# Patient Record
Sex: Female | Born: 1965 | Race: Black or African American | Hispanic: No | Marital: Married | Smoking: Never smoker
Health system: Southern US, Community
[De-identification: ages and names within clinical notes are randomized; demographics above are authoritative.]

## PROBLEM LIST (undated history)

## (undated) DIAGNOSIS — I219 Acute myocardial infarction, unspecified: Secondary | ICD-10-CM

## (undated) DIAGNOSIS — R002 Palpitations: Secondary | ICD-10-CM

## (undated) DIAGNOSIS — F411 Generalized anxiety disorder: Secondary | ICD-10-CM

## (undated) DIAGNOSIS — G43009 Migraine without aura, not intractable, without status migrainosus: Secondary | ICD-10-CM

## (undated) DIAGNOSIS — R5381 Other malaise: Secondary | ICD-10-CM

## (undated) DIAGNOSIS — E669 Obesity, unspecified: Secondary | ICD-10-CM

## (undated) DIAGNOSIS — G35 Multiple sclerosis: Secondary | ICD-10-CM

## (undated) DIAGNOSIS — E785 Hyperlipidemia, unspecified: Secondary | ICD-10-CM

## (undated) DIAGNOSIS — H409 Unspecified glaucoma: Secondary | ICD-10-CM

## (undated) DIAGNOSIS — M549 Dorsalgia, unspecified: Secondary | ICD-10-CM

## (undated) DIAGNOSIS — E559 Vitamin D deficiency, unspecified: Secondary | ICD-10-CM

## (undated) DIAGNOSIS — G43109 Migraine with aura, not intractable, without status migrainosus: Secondary | ICD-10-CM

## (undated) DIAGNOSIS — J302 Other seasonal allergic rhinitis: Secondary | ICD-10-CM

## (undated) DIAGNOSIS — K219 Gastro-esophageal reflux disease without esophagitis: Secondary | ICD-10-CM

## (undated) DIAGNOSIS — M810 Age-related osteoporosis without current pathological fracture: Secondary | ICD-10-CM

## (undated) DIAGNOSIS — M5030 Other cervical disc degeneration, unspecified cervical region: Secondary | ICD-10-CM

## (undated) DIAGNOSIS — R5383 Other fatigue: Secondary | ICD-10-CM

## (undated) HISTORY — DX: Palpitations: R00.2

## (undated) HISTORY — DX: Acute myocardial infarction, unspecified: I21.9

## (undated) HISTORY — DX: Obesity, unspecified: E66.9

## (undated) HISTORY — DX: Unspecified glaucoma: H40.9

## (undated) HISTORY — DX: Migraine with aura, not intractable, without status migrainosus: G43.109

## (undated) HISTORY — PX: OTHER SURGICAL HISTORY: SHX169

## (undated) HISTORY — DX: Vitamin D deficiency, unspecified: E55.9

## (undated) HISTORY — PX: WISDOM TOOTH EXTRACTION: SHX21

## (undated) HISTORY — DX: Other seasonal allergic rhinitis: J30.2

## (undated) HISTORY — DX: Other fatigue: R53.83

## (undated) HISTORY — DX: Hyperlipidemia, unspecified: E78.5

## (undated) HISTORY — DX: Migraine without aura, not intractable, without status migrainosus: G43.009

## (undated) HISTORY — DX: Age-related osteoporosis without current pathological fracture: M81.0

## (undated) HISTORY — DX: Other cervical disc degeneration, unspecified cervical region: M50.30

## (undated) HISTORY — DX: Other malaise: R53.81

## (undated) HISTORY — PX: ABDOMINAL HYSTERECTOMY: SHX81

## (undated) HISTORY — DX: Generalized anxiety disorder: F41.1

## (undated) HISTORY — DX: Gastro-esophageal reflux disease without esophagitis: K21.9

## (undated) HISTORY — DX: Dorsalgia, unspecified: M54.9

## (undated) HISTORY — DX: Multiple sclerosis: G35

---

## 1998-01-19 ENCOUNTER — Other Ambulatory Visit: Admission: RE | Admit: 1998-01-19 | Discharge: 1998-01-19 | Payer: Self-pay | Admitting: Obstetrics and Gynecology

## 1998-02-25 ENCOUNTER — Inpatient Hospital Stay (HOSPITAL_COMMUNITY): Admission: AD | Admit: 1998-02-25 | Discharge: 1998-02-25 | Payer: Self-pay | Admitting: *Deleted

## 1998-04-21 ENCOUNTER — Inpatient Hospital Stay (HOSPITAL_COMMUNITY): Admission: AD | Admit: 1998-04-21 | Discharge: 1998-04-21 | Payer: Self-pay | Admitting: Obstetrics and Gynecology

## 1998-04-23 ENCOUNTER — Ambulatory Visit (HOSPITAL_COMMUNITY): Admission: RE | Admit: 1998-04-23 | Discharge: 1998-04-23 | Payer: Self-pay | Admitting: Obstetrics and Gynecology

## 1998-06-30 ENCOUNTER — Encounter: Payer: Self-pay | Admitting: Obstetrics & Gynecology

## 1998-06-30 ENCOUNTER — Inpatient Hospital Stay (HOSPITAL_COMMUNITY): Admission: AD | Admit: 1998-06-30 | Discharge: 1998-06-30 | Payer: Self-pay | Admitting: Obstetrics & Gynecology

## 1998-07-02 ENCOUNTER — Inpatient Hospital Stay (HOSPITAL_COMMUNITY): Admission: AD | Admit: 1998-07-02 | Discharge: 1998-07-04 | Payer: Self-pay | Admitting: Obstetrics and Gynecology

## 1998-08-30 ENCOUNTER — Ambulatory Visit (HOSPITAL_COMMUNITY): Admission: RE | Admit: 1998-08-30 | Discharge: 1998-08-30 | Payer: Self-pay | Admitting: Obstetrics and Gynecology

## 1998-09-06 ENCOUNTER — Inpatient Hospital Stay (HOSPITAL_COMMUNITY): Admission: AD | Admit: 1998-09-06 | Discharge: 1998-09-09 | Payer: Self-pay | Admitting: Obstetrics and Gynecology

## 1998-10-19 ENCOUNTER — Other Ambulatory Visit: Admission: RE | Admit: 1998-10-19 | Discharge: 1998-10-19 | Payer: Self-pay | Admitting: Obstetrics and Gynecology

## 1998-10-21 ENCOUNTER — Emergency Department (HOSPITAL_COMMUNITY): Admission: EM | Admit: 1998-10-21 | Discharge: 1998-10-21 | Payer: Self-pay | Admitting: Emergency Medicine

## 1998-11-09 ENCOUNTER — Emergency Department (HOSPITAL_COMMUNITY): Admission: EM | Admit: 1998-11-09 | Discharge: 1998-11-09 | Payer: Self-pay | Admitting: Emergency Medicine

## 1998-11-27 ENCOUNTER — Encounter: Payer: Self-pay | Admitting: Internal Medicine

## 1998-11-27 ENCOUNTER — Ambulatory Visit (HOSPITAL_COMMUNITY): Admission: RE | Admit: 1998-11-27 | Discharge: 1998-11-27 | Payer: Self-pay | Admitting: Internal Medicine

## 1998-12-11 ENCOUNTER — Ambulatory Visit (HOSPITAL_COMMUNITY): Admission: RE | Admit: 1998-12-11 | Discharge: 1998-12-11 | Payer: Self-pay | Admitting: Psychiatry

## 1999-02-18 ENCOUNTER — Ambulatory Visit (HOSPITAL_COMMUNITY): Admission: RE | Admit: 1999-02-18 | Discharge: 1999-02-18 | Payer: Self-pay | Admitting: Psychiatry

## 2000-06-24 ENCOUNTER — Encounter: Payer: Self-pay | Admitting: Psychiatry

## 2000-06-24 ENCOUNTER — Ambulatory Visit (HOSPITAL_COMMUNITY): Admission: RE | Admit: 2000-06-24 | Discharge: 2000-06-24 | Payer: Self-pay

## 2001-01-30 ENCOUNTER — Ambulatory Visit (HOSPITAL_COMMUNITY): Admission: RE | Admit: 2001-01-30 | Discharge: 2001-01-30 | Payer: Self-pay | Admitting: Neurosurgery

## 2001-01-30 ENCOUNTER — Encounter: Payer: Self-pay | Admitting: Psychiatry

## 2001-04-24 ENCOUNTER — Emergency Department (HOSPITAL_COMMUNITY): Admission: EM | Admit: 2001-04-24 | Discharge: 2001-04-24 | Payer: Self-pay | Admitting: Emergency Medicine

## 2001-04-24 ENCOUNTER — Encounter: Payer: Self-pay | Admitting: Emergency Medicine

## 2002-05-16 ENCOUNTER — Encounter: Payer: Self-pay | Admitting: Emergency Medicine

## 2002-05-16 ENCOUNTER — Emergency Department (HOSPITAL_COMMUNITY): Admission: EM | Admit: 2002-05-16 | Discharge: 2002-05-16 | Payer: Self-pay | Admitting: Emergency Medicine

## 2002-09-12 ENCOUNTER — Emergency Department (HOSPITAL_COMMUNITY): Admission: EM | Admit: 2002-09-12 | Discharge: 2002-09-12 | Payer: Self-pay | Admitting: Emergency Medicine

## 2002-09-12 ENCOUNTER — Encounter: Payer: Self-pay | Admitting: Emergency Medicine

## 2004-06-20 ENCOUNTER — Ambulatory Visit (HOSPITAL_COMMUNITY): Admission: RE | Admit: 2004-06-20 | Discharge: 2004-06-20 | Payer: Self-pay | Admitting: Neurology

## 2005-02-11 ENCOUNTER — Encounter (INDEPENDENT_AMBULATORY_CARE_PROVIDER_SITE_OTHER): Payer: Self-pay | Admitting: Specialist

## 2005-02-11 ENCOUNTER — Inpatient Hospital Stay (HOSPITAL_COMMUNITY): Admission: RE | Admit: 2005-02-11 | Discharge: 2005-02-12 | Payer: Self-pay | Admitting: Obstetrics and Gynecology

## 2005-06-20 ENCOUNTER — Encounter: Payer: Self-pay | Admitting: Internal Medicine

## 2005-09-08 HISTORY — PX: OTHER SURGICAL HISTORY: SHX169

## 2006-07-19 ENCOUNTER — Inpatient Hospital Stay (HOSPITAL_COMMUNITY): Admission: EM | Admit: 2006-07-19 | Discharge: 2006-07-22 | Payer: Self-pay | Admitting: Emergency Medicine

## 2007-05-30 ENCOUNTER — Emergency Department (HOSPITAL_COMMUNITY): Admission: EM | Admit: 2007-05-30 | Discharge: 2007-05-30 | Payer: Self-pay | Admitting: Emergency Medicine

## 2007-12-24 ENCOUNTER — Emergency Department (HOSPITAL_COMMUNITY): Admission: EM | Admit: 2007-12-24 | Discharge: 2007-12-24 | Payer: Self-pay | Admitting: Emergency Medicine

## 2008-11-16 ENCOUNTER — Emergency Department (HOSPITAL_COMMUNITY): Admission: EM | Admit: 2008-11-16 | Discharge: 2008-11-16 | Payer: Self-pay | Admitting: Emergency Medicine

## 2009-02-15 ENCOUNTER — Emergency Department (HOSPITAL_COMMUNITY): Admission: EM | Admit: 2009-02-15 | Discharge: 2009-02-15 | Payer: Self-pay | Admitting: Emergency Medicine

## 2009-05-16 ENCOUNTER — Emergency Department (HOSPITAL_COMMUNITY): Admission: EM | Admit: 2009-05-16 | Discharge: 2009-05-16 | Payer: Self-pay | Admitting: Emergency Medicine

## 2009-06-08 LAB — CONVERTED CEMR LAB: Pap Smear: NORMAL

## 2009-06-08 LAB — HM MAMMOGRAPHY: HM Mammogram: NORMAL

## 2009-06-12 ENCOUNTER — Encounter: Admission: RE | Admit: 2009-06-12 | Discharge: 2009-06-12 | Payer: Self-pay | Admitting: Gastroenterology

## 2010-03-27 ENCOUNTER — Ambulatory Visit: Payer: Self-pay | Admitting: Internal Medicine

## 2010-03-27 DIAGNOSIS — E785 Hyperlipidemia, unspecified: Secondary | ICD-10-CM | POA: Insufficient documentation

## 2010-03-27 DIAGNOSIS — F411 Generalized anxiety disorder: Secondary | ICD-10-CM | POA: Insufficient documentation

## 2010-03-27 DIAGNOSIS — M5030 Other cervical disc degeneration, unspecified cervical region: Secondary | ICD-10-CM

## 2010-03-27 DIAGNOSIS — F410 Panic disorder [episodic paroxysmal anxiety] without agoraphobia: Secondary | ICD-10-CM | POA: Insufficient documentation

## 2010-03-27 DIAGNOSIS — M549 Dorsalgia, unspecified: Secondary | ICD-10-CM | POA: Insufficient documentation

## 2010-03-27 DIAGNOSIS — R5381 Other malaise: Secondary | ICD-10-CM

## 2010-03-27 DIAGNOSIS — R5383 Other fatigue: Secondary | ICD-10-CM

## 2010-03-27 DIAGNOSIS — G43009 Migraine without aura, not intractable, without status migrainosus: Secondary | ICD-10-CM

## 2010-03-27 DIAGNOSIS — K219 Gastro-esophageal reflux disease without esophagitis: Secondary | ICD-10-CM

## 2010-03-27 HISTORY — DX: Other cervical disc degeneration, unspecified cervical region: M50.30

## 2010-03-27 HISTORY — DX: Hyperlipidemia, unspecified: E78.5

## 2010-03-27 HISTORY — DX: Gastro-esophageal reflux disease without esophagitis: K21.9

## 2010-03-27 HISTORY — DX: Other malaise: R53.81

## 2010-03-27 HISTORY — DX: Migraine without aura, not intractable, without status migrainosus: G43.009

## 2010-03-27 HISTORY — DX: Generalized anxiety disorder: F41.1

## 2010-03-27 HISTORY — DX: Dorsalgia, unspecified: M54.9

## 2010-03-27 LAB — CONVERTED CEMR LAB
ALT: 23 units/L (ref 0–35)
AST: 24 units/L (ref 0–37)
Albumin: 3.7 g/dL (ref 3.5–5.2)
Alkaline Phosphatase: 70 units/L (ref 39–117)
BUN: 12 mg/dL (ref 6–23)
Basophils Absolute: 0 10*3/uL (ref 0.0–0.1)
Basophils Relative: 0.3 % (ref 0.0–3.0)
Bilirubin Urine: NEGATIVE
Bilirubin, Direct: 0.1 mg/dL (ref 0.0–0.3)
CO2: 30 meq/L (ref 19–32)
Calcium: 9.2 mg/dL (ref 8.4–10.5)
Chloride: 102 meq/L (ref 96–112)
Cholesterol: 241 mg/dL — ABNORMAL HIGH (ref 0–200)
Creatinine, Ser: 0.5 mg/dL (ref 0.4–1.2)
Direct LDL: 167.6 mg/dL
Eosinophils Absolute: 0.1 10*3/uL (ref 0.0–0.7)
Eosinophils Relative: 1 % (ref 0.0–5.0)
Folate: 19.8 ng/mL
GFR calc non Af Amer: 157.23 mL/min (ref >60)
Glucose, Bld: 85 mg/dL (ref 70–99)
HCT: 40.2 % (ref 36.0–46.0)
HDL: 50.1 mg/dL (ref >39.00)
Hemoglobin, Urine: NEGATIVE
Hemoglobin: 13.6 g/dL (ref 12.0–15.0)
Iron: 84 ug/dL (ref 42–145)
Ketones, ur: NEGATIVE mg/dL
Lymphocytes Relative: 40.3 % (ref 12.0–46.0)
Lymphs Abs: 2.5 10*3/uL (ref 0.7–4.0)
MCHC: 33.9 g/dL (ref 30.0–36.0)
MCV: 95.5 fL (ref 78.0–100.0)
Monocytes Absolute: 0.5 10*3/uL (ref 0.1–1.0)
Monocytes Relative: 7.6 % (ref 3.0–12.0)
Neutro Abs: 3.2 10*3/uL (ref 1.4–7.7)
Neutrophils Relative %: 50.8 % (ref 43.0–77.0)
Nitrite: NEGATIVE
Platelets: 233 10*3/uL (ref 150.0–400.0)
Potassium: 4.5 meq/L (ref 3.5–5.1)
RBC: 4.21 M/uL (ref 3.87–5.11)
RDW: 13.9 % (ref 11.5–14.6)
Saturation Ratios: 25.8 % (ref 20.0–50.0)
Sed Rate: 39 mm/hr — ABNORMAL HIGH (ref 0–22)
Sodium: 134 meq/L — ABNORMAL LOW (ref 135–145)
Specific Gravity, Urine: 1.02 (ref 1.000–1.030)
TSH: 1.02 microintl units/mL (ref 0.35–5.50)
Total Bilirubin: 0.3 mg/dL (ref 0.3–1.2)
Total CHOL/HDL Ratio: 5
Total Protein, Urine: NEGATIVE mg/dL
Total Protein: 7.2 g/dL (ref 6.0–8.3)
Transferrin: 232.5 mg/dL (ref 212.0–360.0)
Triglycerides: 118 mg/dL (ref 0.0–149.0)
Urine Glucose: NEGATIVE mg/dL
Urobilinogen, UA: 0.2 (ref 0.0–1.0)
VLDL: 23.6 mg/dL (ref 0.0–40.0)
Vitamin B-12: 702 pg/mL (ref 211–911)
WBC: 6.3 10*3/uL (ref 4.5–10.5)
pH: 8 (ref 5.0–8.0)

## 2010-03-28 LAB — CONVERTED CEMR LAB: Vit D, 25-Hydroxy: 22 ng/mL — ABNORMAL LOW (ref 30–89)

## 2010-08-07 ENCOUNTER — Ambulatory Visit (HOSPITAL_COMMUNITY)
Admission: RE | Admit: 2010-08-07 | Discharge: 2010-08-08 | Payer: Self-pay | Source: Home / Self Care | Admitting: Ophthalmology

## 2010-09-26 ENCOUNTER — Telehealth: Payer: Self-pay | Admitting: Endocrinology

## 2010-10-10 NOTE — Assessment & Plan Note (Signed)
Summary: NEW/ MEDICARE/ [place]ICAID/[place]  #   Vital Signs:  Patient profile:   45 year old female Height:      63 inches Weight:      204.25 pounds BMI:     36.31 O2 Sat:      99 % on Room air Temp:     98.1 degrees F oral Pulse rate:   68 / minute BP sitting:   94 / 64  (left arm) Cuff size:   large  Vitals Entered By: Zella Ball Ewing CMA Duncan Dull) (March 27, 2010 9:44 AM)  O2 Flow:  Room air  Preventive Care Screening  Last Tetanus Booster:    Date:  03/27/2010    Results:  Tdap  Pap Smear:    Date:  06/08/2009    Results:  normal   Mammogram:    Date:  06/08/2009    Results:  normal   CC: New Patient, New Medicare/Re   CC:  New Patient and New Medicare/Re.  History of Present Illness: overall doing ok; has chronic anxiety and vague symtpoms she attributes to MS, Pt denies CP, sob, doe, wheezing, orthopnea, pnd, worsening LE edema, palps, dizziness or syncope  Pt denies new neuro symptoms such as headache, facial or extremity weakness Denies polydipsia, polyuria.  No fever, wt loss, night sweats, loss of appetite or other constitutional symptoms Overall good med compliacne and tolerance.  Has hx of significant wt gain with prednisone in the past Here for wellness Diet: Heart Healthy or DM if diabetic Physical Activities: Sedentary Depression/mood screen: Negative Hearing: Intact bilateral Visual Acuity: Grossly normal, gets exam yearly ADL's: Capable  Fall Risk: None Home Safety: Good Cognitive Impairment:  Gen appearance, affect, speech, memory, attention & motor skills grossly intact End-of-Life Planning: Advance directive - Full code/I agree   Preventive Screening-Counseling & Management  Alcohol-Tobacco     Smoking Status: never      Drug Use:  no.    Problems Prior to Update: 1)  Common Migraine  (ICD-346.10) 2)  Fatigue  (ICD-780.79) 3)  Disc Disease, Cervical  (ICD-722.4) 4)  Back Pain, Chronic  (ICD-724.5) 5)  Hyperlipidemia  (ICD-272.4) 6)  Anxiety   (ICD-300.00) 7)  Gerd  (ICD-530.81)  Medications Prior to Update: 1)  None  Current Medications (verified): 1)  Ibuprofen 600 Mg Tabs (Ibuprofen) .Marland Kitchen.. 1 By Mouth Once Daily As Needed For Pain 2)  Alprazolam 0.5 Mg Tabs (Alprazolam) .... 1/2 By Mouth Two Times A Day 3)  Allegra 60 Mg Tabs (Fexofenadine Hcl) .Marland Kitchen.. 1 By Mouth Once Daily As Needed For Allergies 4)  Combigan 0.2-0.5 % Soln (Brimonidine Tartrate-Timolol) .... 2 Drops Every Day 5)  Cymbalta 60 Mg Cpep (Duloxetine Hcl) .Marland Kitchen.. 1 By Mouth Once Daily  Allergies (verified): 1)  ! * Statins  Past History:  Family History: Last updated: 03/27/2010 multiple with DM, HTN, arthritis 2 uncle with lung cancer father with heart disease , and 2 uncles   Social History: Last updated: 03/27/2010 disabled due to "MS"   - since 2005 Single 1 son not working Never Smoked Alcohol use-no Drug use-no  Risk Factors: Smoking Status: never (03/27/2010)  Past Medical History: GERD ? MS - multiple sclerosis - per neurology 2002 [facility], still seen yearly/neg MRI brain 2005 chronic headaches/migraine Anxiety Hyperlipidemia chronic back pain  - mid back with radiation to all extremities on palpation for year cervical spine disease - mild by [facility] 2005  Past Surgical History: Hysterectomy s/p knee surgury s/p eye surgury Neg stress  test nov 2007  Family History: Reviewed history and no changes required. multiple with DM, HTN, arthritis 2 uncle with lung cancer father with heart disease , and 2 uncles   Social History: Reviewed history and no changes required. disabled due to "MS"   - since 2005 Single 1 son not working Never Smoked Alcohol use-no Drug use-no Smoking Status:  never Drug Use:  no  Review of Systems  The patient denies anorexia, fever, weight loss, weight gain, vision loss, decreased hearing, hoarseness, chest pain, syncope, dyspnea on exertion, peripheral edema, prolonged cough, headaches, hemoptysis,  abdominal pain, melena, hematochezia, severe indigestion/heartburn, hematuria, muscle weakness, suspicious skin lesions, transient blindness, difficulty walking, depression, unusual weight change, abnormal bleeding, and enlarged lymph nodes.         all otherwise negative per pt -  - except for fatigue without OSA symptoms, and decrlines depressive symptoms  Physical Exam  General:  alert and overweight-appearing.   Head:  normocephalic and atraumatic.   Eyes:  vision grossly intact, pupils equal, and pupils round.   Ears:  R ear normal and L ear normal.   Nose:  no external deformity and no nasal discharge.   Mouth:  no gingival abnormalities and pharynx pink and moist.   Neck:  supple and no masses.   Lungs:  normal respiratory effort and normal breath sounds.   Heart:  normal rate and regular rhythm.   Abdomen:  soft, non-tender, and normal bowel sounds.   Msk:  no joint tenderness and no joint swelling.   Extremities:  no edema, no erythema  Neurologic:  cranial nerves II-XII intact, strength normal in all extremities, gait normal, and DTRs symmetrical and normal.     Impression & Recommendations:  Problem # 1:  Preventive Health Care (ICD-V70.0)  Overall doing well, age appropriate education and counseling updated and referral for appropriate preventive services done unless declined, immunizations up to date or declined, diet counseling done if overweight, urged to quit smoking if smokes , most recent labs reviewed and current ordered if appropriate, ecg reviewed or declined (interpretation per ECG scanned in the EMR if done); information regarding Medicare Prevention requirements given if appropriate; speciality referrals updated as appropriate   Orders: First annual wellness visit with prevention plan  (Y7829)  Problem # 2:  FATIGUE (ICD-780.79)  exam benign, to check labs below; follow with expectant management   Orders: TLB-BMP (Basic Metabolic Panel-BMET)  (80048-METABOL) TLB-CBC Platelet - w/Differential (85025-CBCD) TLB-Hepatic/Liver Function Pnl (80076-HEPATIC) TLB-TSH (Thyroid Stimulating Hormone) (84443-TSH) TLB-Sedimentation Rate (ESR) (85652-ESR) TLB-IBC Pnl (Iron/FE;Transferrin) (83550-IBC) TLB-B12 + Folate Pnl (82746_82607-B12/FOL) TLB-Udip ONLY (81003-UDIP) T-Vitamin D (25-Hydroxy) (56213-08657)  Problem # 3:  BACK PAIN, CHRONIC (ICD-724.5)  Her updated medication list for this problem includes:    Ibuprofen 600 Mg Tabs (Ibuprofen) .Marland Kitchen... 1 by mouth once daily as needed for pain ok for cymbalta trial, likely to help anxiety as well   Orders: Prescription Created Electronically [phone])  Problem # 4:  HYPERLIPIDEMIA (ICD-272.4) Pt to continue diet efforts, statin intolerant in the psat ; to check labs - goal LDL less than 100 Orders: TLB-Lipid Panel (80061-LIPID)  Problem # 5:  GERD (ICD-530.81) minor per pt, no dysphagia, ok to follow  Problem # 6:  ANXIETY (ICD-300.00)  Her updated medication list for this problem includes:    Alprazolam 0.5 Mg Tabs (Alprazolam) .Marland Kitchen... 1/2 by mouth two times a day    Cymbalta 60 Mg Cpep (Duloxetine hcl) .Marland Kitchen... 1 by mouth once daily stable overall by  hx and exam, ok to continue meds/tx as is   Complete Medication List: 1)  Ibuprofen 600 Mg Tabs (Ibuprofen) .Marland Kitchen.. 1 by mouth once daily as needed for pain 2)  Alprazolam 0.5 Mg Tabs (Alprazolam) .... 1/2 by mouth two times a day 3)  Allegra 60 Mg Tabs (Fexofenadine hcl) .Marland Kitchen.. 1 by mouth once daily as needed for allergies 4)  Combigan 0.2-0.5 % Soln (Brimonidine tartrate-timolol) .... 2 drops every day 5)  Cymbalta 60 Mg Cpep (Duloxetine hcl) .Marland Kitchen.. 1 by mouth once daily  Other Orders: Tdap => 9yrs IM (21308) Admin 1st Vaccine (65784)  Patient Instructions: 1)  you had the tetanus shot today 2)  Please go to the Lab in the basement for your blood and/or urine tests today 3)  Please take all new medications as prescribed  - the cymbalta is  started at 30 mg per day for one wk, then 60 mg per day after that 4)  Continue all previous medications as before this visit  5)  Please schedule a follow-up appointment in 6 months or sooner  Prescriptions: CYMBALTA 60 MG CPEP (DULOXETINE HCL) 1 by mouth once daily  #90 x 3   Entered and Authorized by:   Corwin Levins MD   Signed by:   Corwin Levins MD on 03/27/2010   Method used:   Print then Give to Patient   RxID:   [id number]    Immunizations Administered:  Tetanus Vaccine:    Vaccine Type: Tdap    Site: left deltoid    Mfr: GlaxoSmithKline    Dose: 0.5 ml    Route: IM    Given by: Zella Ball Ewing CMA (AAMA)    Exp. Date: 12/01/2011    Lot #: [id number]    VIS given: 07/27/07 version given March 27, 2010.

## 2010-10-10 NOTE — Letter (Signed)
Summary: Generic Letter  [facility]  [address] [place], [place] [address]   Phone: [phone]  Fax: [phone]    03/27/2010  [name] [address] [place], [place]  [address]  Dear [name],        You should be excused from [employer] due to  severe migraines likely to be exacerbated by the  experience.       Sincerely,   Oliver Barre MD

## 2010-10-10 NOTE — Progress Notes (Signed)
Summary: Xanax/[facility] pt  Phone Note Refill Request Message from:  Patient on September 26, 2010 9:10 AM  Refills Requested: Medication #1:  ALPRAZOLAM 0.5 MG TABS 1/2 by mouth two times a day   Dosage confirmed as above?Dosage Confirmed   Supply Requested: 1 month   Notes: Next appt 10/11/2010  Method Requested: Fax to Local Pharmacy Initial call taken by: Margaret Pyle, CMA,  September 26, 2010 9:10 AM  Follow-up for Phone Call        i printed Follow-up by: Minus Breeding MD,  September 26, 2010 12:32 PM  Additional Follow-up for Phone Call Additional follow up Details #1::        [facility]  [address] [facility] pt req Additional Follow-up by: Margaret Pyle, CMA,  September 26, 2010 1:14 PM    Prescriptions: ALPRAZOLAM 0.5 MG TABS (ALPRAZOLAM) 1/2 by mouth two times a day  #30 x 0   Entered and Authorized by:   Minus Breeding MD   Signed by:   Minus Breeding MD on 09/26/2010   Method used:   Print then Give to Patient   RxID:   [id number]

## 2010-10-11 ENCOUNTER — Ambulatory Visit: Admit: 2010-10-11 | Payer: Self-pay | Admitting: Internal Medicine

## 2010-10-11 ENCOUNTER — Ambulatory Visit: Payer: Self-pay | Admitting: Internal Medicine

## 2010-10-23 ENCOUNTER — Encounter: Payer: Self-pay | Admitting: Internal Medicine

## 2010-10-23 ENCOUNTER — Ambulatory Visit (INDEPENDENT_AMBULATORY_CARE_PROVIDER_SITE_OTHER): Payer: Medicare Other | Admitting: Internal Medicine

## 2010-10-23 DIAGNOSIS — G35D Multiple sclerosis, unspecified: Secondary | ICD-10-CM | POA: Insufficient documentation

## 2010-10-23 DIAGNOSIS — M549 Dorsalgia, unspecified: Secondary | ICD-10-CM

## 2010-10-23 DIAGNOSIS — G35 Multiple sclerosis: Secondary | ICD-10-CM

## 2010-10-23 DIAGNOSIS — F411 Generalized anxiety disorder: Secondary | ICD-10-CM

## 2010-10-23 DIAGNOSIS — E559 Vitamin D deficiency, unspecified: Secondary | ICD-10-CM

## 2010-10-23 DIAGNOSIS — E785 Hyperlipidemia, unspecified: Secondary | ICD-10-CM

## 2010-10-23 HISTORY — DX: Multiple sclerosis: G35

## 2010-10-23 HISTORY — DX: Vitamin D deficiency, unspecified: E55.9

## 2010-10-29 ENCOUNTER — Other Ambulatory Visit: Payer: Self-pay | Admitting: Internal Medicine

## 2010-10-29 DIAGNOSIS — E785 Hyperlipidemia, unspecified: Secondary | ICD-10-CM

## 2010-10-30 ENCOUNTER — Encounter (INDEPENDENT_AMBULATORY_CARE_PROVIDER_SITE_OTHER): Payer: Self-pay | Admitting: *Deleted

## 2010-10-30 NOTE — Assessment & Plan Note (Signed)
Summary: 6 MONTH ROV--LB...BMP LIST/CD.Marland KitchenPER PT RS   Vital Signs:  Patient profile:   45 year old female Height:      63.5 inches Weight:      187.38 pounds BMI:     32.79 O2 Sat:      97 % on Room air Temp:     98.5 degrees F oral Pulse rate:   80 / minute BP sitting:   110 / 72  (left arm) Cuff size:   regular  Vitals Entered By: Zella Ball Ewing CMA (AAMA) (October 23, 2010 3:09 PM)  O2 Flow:  Room air  Preventive Care Screening     declines flu shot  CC: 6 month ROV/RE   CC:  6 month ROV/RE.  History of Present Illness: here to f/u - overall doing ok , but has had 1 wk increased severity of overall chronic o/w stable LBP; no worsening LE pain/weak/numb, gait change, falls, bowel or bladder change.   Pt denies CP, worsening sob, doe, wheezing, orthopnea, pnd, worsening LE edema, palps, dizziness or syncope  Pt denies new neuro symptoms such as headache, facial or extremity weakness  Pt denies polydipsia, polyuria   Overall good compliance with meds, trying to follow low chol diet, wt stable, little excercise however .  Denies worsening depressive symptoms, suicidal ideation, or panic, though has ongoing anxiety.  No fever, wt loss, night sweats, loss of appetite or other constitutional symptoms Overall good compliance with meds, and good tolerability.  Seems now also MS diagnosis is in question as a neuroloigst at [facility] suggested she doed not have MS.  Last MRI > 3 yrs per pt  Problems Prior to Update: 1)  Unspecified Vitamin D Deficiency  (ICD-268.9) 2)  Multiple Sclerosis  (ICD-340) 3)  Preventive Health Care  (ICD-V70.0) 4)  Common Migraine  (ICD-346.10) 5)  Fatigue  (ICD-780.79) 6)  Disc Disease, Cervical  (ICD-722.4) 7)  Back Pain, Chronic  (ICD-724.5) 8)  Hyperlipidemia  (ICD-272.4) 9)  Anxiety  (ICD-300.00) 10)  Gerd  (ICD-530.81)  Medications Prior to Update: 1)  Ibuprofen 600 Mg Tabs (Ibuprofen) .Marland Kitchen.. 1 By Mouth Once Daily As Needed For Pain 2)  Alprazolam 0.5 Mg  Tabs (Alprazolam) .... 1/2 By Mouth Two Times A Day 3)  Allegra 60 Mg Tabs (Fexofenadine Hcl) .Marland Kitchen.. 1 By Mouth Once Daily As Needed For Allergies 4)  Combigan 0.2-0.5 % Soln (Brimonidine Tartrate-Timolol) .... 2 Drops Every Day 5)  Cymbalta 60 Mg Cpep (Duloxetine Hcl) .Marland Kitchen.. 1 By Mouth Once Daily  Current Medications (verified): 1)  Ibuprofen 600 Mg Tabs (Ibuprofen) .Marland Kitchen.. 1 By Mouth Once Daily As Needed For Pain 2)  Alprazolam 0.5 Mg Tabs (Alprazolam) .... 1/2 By Mouth Two Times A Day As Needed 3)  Allegra 60 Mg Tabs (Fexofenadine Hcl) .Marland Kitchen.. 1 By Mouth Once Daily As Needed For Allergies 4)  Combigan 0.2-0.5 % Soln (Brimonidine Tartrate-Timolol) .... 2 Drops Every Day 5)  Tramadol Hcl 100 Mg Xr24h-Tab (Tramadol Hcl) .Marland Kitchen.. 1po Once Daily As Needed  Allergies (verified): 1)  ! * Statins 2)  * Cymbalta  Past History:  Past Surgical History: Last updated: 03/27/2010 Hysterectomy s/p knee surgury s/p eye surgury Neg stress test nov 2007  Social History: Last updated: 03/27/2010 disabled due to "MS"   - since 2005 Single 1 son not working Never Smoked Alcohol use-no Drug use-no  Risk Factors: Smoking Status: never (03/27/2010)  Past Medical History: GERD ? MS - multiple sclerosis - per neurology 2002 [facility], still seen  yearly/neg MRI brain 2005 chronic headaches/migraine Anxiety Hyperlipidemia chronic back pain  - mid back with radiation to all extremities on palpation for year cervical spine disease - mild by [facility] 2005 Vit D deficiency  Review of Systems       all otherwise negative per pt -    Physical Exam  General:  alert and overweight-appearing.   Head:  normocephalic and atraumatic.   Eyes:  vision grossly intact, pupils equal, and pupils round.   Ears:  R ear normal and L ear normal.   Nose:  no external deformity and no nasal discharge.   Mouth:  no gingival abnormalities and pharynx pink and moist.   Neck:  supple and no masses.   Lungs:  normal respiratory  effort and normal breath sounds.   Heart:  normal rate and regular rhythm.   Abdomen:  soft, non-tender, and normal bowel sounds.   Msk:  no joint tenderness and no joint swelling.   Extremities:  no edema, no erythema  Neurologic:  cranial nerves II-XII intact, strength normal in all extremities, gait normal, and DTRs symmetrical and normal.   Psych:  not depressed appearing and slightly anxious.     Impression & Recommendations:  Problem # 1:  MULTIPLE SCLEROSIS (ICD-340)  ? accurate diagnosis  - will refer locally to neuro as pt no longer seeing [facility] neuro who suggseted she may not have MS then left the practice there;  will re-do MRI head as not done for may yrs and refer neurology  Orders: Neurology Referral (Neuro) Radiology Referral (Radiology)  Problem # 2:  BACK PAIN, CHRONIC (ICD-724.5)  Her updated medication list for this problem includes:    Ibuprofen 600 Mg Tabs (Ibuprofen) .Marland Kitchen... 1 by mouth once daily as needed for pain    Tramadol Hcl 100 Mg Xr24h-tab (Tramadol hcl) .Marland Kitchen... 1po once daily as needed with mod flare , exam o/w stable - treat as above, f/u any worsening signs or symptoms , f/u neuro as above  Discussed use of moist heat or ice, modified activities, medications, and stretching/strengthening exercises  Problem # 3:  ANXIETY (ICD-300.00)  The following medications were removed from the medication list:    Cymbalta 60 Mg Cpep (Duloxetine hcl) .Marland Kitchen... 1 by mouth once daily Her updated medication list for this problem includes:    Alprazolam 0.5 Mg Tabs (Alprazolam) .Marland Kitchen... 1/2 by mouth two times a day as needed  Discussed medication use and relaxation techniques.  stable overall by hx and exam, ok to continue meds/tx as is , treat as above, f/u any worsening signs or symptoms   Problem # 4:  HYPERLIPIDEMIA (ICD-272.4)  d/w pt - declines re-try statin due to hx myalgias with lipitor and vytorin  Labs Reviewed: SGOT: 24 (03/27/2010)   SGPT: 23  (03/27/2010)   HDL:50.10 (03/27/2010)  Chol:241 (03/27/2010)  Trig:118.0 (03/27/2010)  Complete Medication List: 1)  Ibuprofen 600 Mg Tabs (Ibuprofen) .Marland Kitchen.. 1 by mouth once daily as needed for pain 2)  Alprazolam 0.5 Mg Tabs (Alprazolam) .... 1/2 by mouth two times a day as needed 3)  Allegra 60 Mg Tabs (Fexofenadine hcl) .Marland Kitchen.. 1 by mouth once daily as needed for allergies 4)  Combigan 0.2-0.5 % Soln (Brimonidine tartrate-timolol) .... 2 drops every day 5)  Tramadol Hcl 100 Mg Xr24h-tab (Tramadol hcl) .Marland Kitchen.. 1po once daily as needed  Patient Instructions: 1)  Please take all new medications as prescribed - the new pain medication 2)  Please take Vit D 1000 units per  day (OTC) 3)  Continue all previous medications as before this visit, including the alprazolam 4)  Please call for refills as you need them 5)  You will be contacted about the referral(s) to: MRI for the head, as well Neurology referral 6)  Please schedule a follow-up appointment in 6 months. Prescriptions: ALPRAZOLAM 0.5 MG TABS (ALPRAZOLAM) 1/2 by mouth two times a day as needed  #60 x 2   Entered and Authorized by:   Corwin Levins MD   Signed by:   Corwin Levins MD on 10/23/2010   Method used:   Print then Give to Patient   RxID:   [id number] TRAMADOL HCL 100 MG XR24H-TAB (TRAMADOL HCL) 1po once daily as needed  #30 x 1   Entered and Authorized by:   Corwin Levins MD   Signed by:   Corwin Levins MD on 10/23/2010   Method used:   [facility]  [address] [phone][facility] (retail)       [address]       [place], [place]  [id number]       Ph: [phone] or [phone]       Fax: [phone]   RxID:   [id number]    Orders Added: 1)  Neurology Referral [Neuro] 2)  Radiology Referral [Radiology] 3)  Est. Patient Level IV [[address]]

## 2010-11-05 ENCOUNTER — Other Ambulatory Visit (HOSPITAL_COMMUNITY): Payer: Medicare Other

## 2010-11-05 NOTE — Letter (Signed)
Summary: Primary Care Consult Scheduled Letter  [facility]  [address] [place], [place] [address]   Phone: [phone]  Fax: [phone]      10/30/2010 MRN: [id number]  [name] [address] [place], [place]  [address]  [place]    Dear [name],      We have scheduled an appointment for you.  At the recommendation of Dr.John, we have scheduled you a MRI [facility] on [phone] at 7:00PM.  [place] [address] [place] [address]. The office phone number is [phone] If this appointment day and time is not convenient for you, please feel free to call the office of the doctor you are being referred to at the number listed above and reschedule the appointment.     It is important for you to keep your scheduled appointments. We are here to make sure you are given good patient care. If you have questions or you have made changes to your appointment, please notify us at  [phone]         , ask for   DEBRA           .    Thank you,  Patient Care Coordinator [facility]

## 2010-11-09 ENCOUNTER — Encounter (HOSPITAL_COMMUNITY): Payer: Self-pay

## 2010-11-09 ENCOUNTER — Ambulatory Visit (HOSPITAL_COMMUNITY)
Admission: RE | Admit: 2010-11-09 | Discharge: 2010-11-09 | Disposition: A | Discharge status: Home / Self Care | Payer: Medicare Other | Source: Ambulatory Visit | Attending: Internal Medicine | Admitting: Internal Medicine

## 2010-11-09 DIAGNOSIS — R5383 Other fatigue: Secondary | ICD-10-CM | POA: Insufficient documentation

## 2010-11-09 DIAGNOSIS — D571 Sickle-cell disease without crisis: Secondary | ICD-10-CM | POA: Insufficient documentation

## 2010-11-09 DIAGNOSIS — E785 Hyperlipidemia, unspecified: Secondary | ICD-10-CM

## 2010-11-09 DIAGNOSIS — R5381 Other malaise: Secondary | ICD-10-CM | POA: Insufficient documentation

## 2010-11-09 DIAGNOSIS — H532 Diplopia: Secondary | ICD-10-CM | POA: Insufficient documentation

## 2010-11-09 DIAGNOSIS — R279 Unspecified lack of coordination: Secondary | ICD-10-CM | POA: Insufficient documentation

## 2010-11-09 DIAGNOSIS — R51 Headache: Secondary | ICD-10-CM | POA: Insufficient documentation

## 2010-11-19 ENCOUNTER — Encounter: Payer: Self-pay | Admitting: Internal Medicine

## 2010-11-19 LAB — GRAM STAIN

## 2010-11-19 LAB — CBC
HCT: 39.7 % (ref 36.0–46.0)
Hemoglobin: 13.5 g/dL (ref 12.0–15.0)
MCH: 32.1 pg (ref 26.0–34.0)
MCHC: 34 g/dL (ref 30.0–36.0)
MCV: 94.3 fL (ref 78.0–100.0)
Platelets: 256 10*3/uL (ref 150–400)
RBC: 4.21 MIL/uL (ref 3.87–5.11)
RDW: 14.2 % (ref 11.5–15.5)
WBC: 7 10*3/uL (ref 4.0–10.5)

## 2010-11-19 LAB — EYE CULTURE

## 2010-11-19 LAB — BASIC METABOLIC PANEL
BUN: 7 mg/dL (ref 6–23)
CO2: 28 mEq/L (ref 19–32)
Calcium: 9.1 mg/dL (ref 8.4–10.5)
Chloride: 101 mEq/L (ref 96–112)
Creatinine, Ser: 0.61 mg/dL (ref 0.4–1.2)
GFR calc Af Amer: >60 mL/min (ref >60)
GFR calc non Af Amer: >60 mL/min (ref >60)
Glucose, Bld: 109 mg/dL — ABNORMAL HIGH (ref 70–99)
Potassium: 4 mEq/L (ref 3.5–5.1)
Sodium: 133 mEq/L — ABNORMAL LOW (ref 135–145)

## 2010-11-19 LAB — SURGICAL PCR SCREEN
MRSA, PCR: NEGATIVE
Staphylococcus aureus: NEGATIVE

## 2010-11-26 NOTE — Consult Note (Signed)
Summary: [facility]  [facility]   Imported By: Sherian Rein 11/22/2010 13:47:12  _____________________________________________________________________  External Attachment:    Type:   Image     Comment:   External Document

## 2010-12-10 ENCOUNTER — Encounter: Payer: Self-pay | Admitting: Internal Medicine

## 2010-12-10 ENCOUNTER — Ambulatory Visit (INDEPENDENT_AMBULATORY_CARE_PROVIDER_SITE_OTHER): Payer: Medicare Other | Admitting: Internal Medicine

## 2010-12-10 VITALS — BP 102/70 | HR 87 | Temp 97.8°F | Ht 63.0 in | Wt 187.0 lb

## 2010-12-10 DIAGNOSIS — E559 Vitamin D deficiency, unspecified: Secondary | ICD-10-CM

## 2010-12-10 DIAGNOSIS — J309 Allergic rhinitis, unspecified: Secondary | ICD-10-CM | POA: Insufficient documentation

## 2010-12-10 DIAGNOSIS — E785 Hyperlipidemia, unspecified: Secondary | ICD-10-CM

## 2010-12-10 DIAGNOSIS — G43009 Migraine without aura, not intractable, without status migrainosus: Secondary | ICD-10-CM

## 2010-12-10 DIAGNOSIS — F411 Generalized anxiety disorder: Secondary | ICD-10-CM

## 2010-12-10 MED ORDER — ESCITALOPRAM OXALATE 10 MG PO TABS: 10.0000 mg | ORAL_TABLET | Freq: Every day | ORAL | Status: DC

## 2010-12-10 MED ORDER — SUMATRIPTAN SUCCINATE 100 MG PO TABS: 100.0000 mg | ORAL_TABLET | Freq: Once | ORAL | Status: DC | PRN

## 2010-12-10 NOTE — Assessment & Plan Note (Signed)
Pt has stopped the topamax, and plans to f/u with neurology as suggested, for now will tx with imitrex prn

## 2010-12-10 NOTE — Assessment & Plan Note (Signed)
Recent labs reviewed with pt;  To start lower chol diet, declines dietary referral, f/u with labs next visit

## 2010-12-10 NOTE — Assessment & Plan Note (Signed)
Most recent vit d reviewed with pt;  For vit d 1000 units per day

## 2010-12-10 NOTE — Assessment & Plan Note (Signed)
Mild seasonal - for OTC allegra prn, and mucinex otc prn

## 2010-12-10 NOTE — Patient Instructions (Addendum)
Take all new medications as prescribed Continue all other medications as before, except stop the topamax as you have Please followup with neurology as planned Start vit d 1000 units per day You can also take Delsym OTC for cough, and/or Mucinex (or it's generic off brand) for congestion, and allegra prn for allergies Please return in 4 months, or sooner if needed

## 2010-12-10 NOTE — Progress Notes (Signed)
Subjective:    Patient ID: [name], female    DOB: October 22, 1965, 45 y.o.   MRN: [id number]  HPI  Here to f/u with c/o recent increased anxiety symptoms and panic with recurring episodes increased x4 wks of shaky, tremulous, sob, palpitations and difficulty swallowing, dizziness that seems to resolve quickly within hours;  Alprazolam current dosing seemed to help before, but not as good lately;  Pt denies chest pain,  wheezing, orthopnea, PND, increased LE swelling,  or syncope.  Pt denies new neurological symptoms such as new headache, or facial or extremity weakness or numbness except still has recurring headaches c/w migraine , as per last eval per neurology as well feb 2012, reviewed with pt today.  Was presribed topamax per neuro but only took 2 days as seemed to exacerbate the anxiety.  Has not tried SSRI in the past, or imitrex type meds for HA.  Wants to go over last visit labs as well.  Pt denies polydipsia, polyuria Pt states overall good compliance with meds, but not really trying to follow lower cholesterol  wt overall stable but little exercise however.     Also with 3-4 wks increased nasal allergy symtpoms with itch and sneeze and bilat ear fullenss, with popping and crackling, without ear pain, fever, ST, cough or swelling or wheezing.   Past Medical History  Diagnosis Date  . HYPERLIPIDEMIA 03/27/2010  . ANXIETY 03/27/2010  . COMMON MIGRAINE 03/27/2010  . GERD 03/27/2010  . DISC DISEASE, CERVICAL 03/27/2010  . BACK PAIN, CHRONIC 03/27/2010  . FATIGUE 03/27/2010  . Unspecified vitamin D deficiency 10/23/2010  . Multiple sclerosis 10/23/2010   Past Surgical History  Procedure Date  . Abdominal hysterectomy   . S/p knee surgury   . S/p eye surgury   . Neg stress test 2007    reports that she has never smoked. She does not have any smokeless tobacco history on file. She reports that she does not drink alcohol or use illicit drugs. family history includes Arthritis in her other;  Cancer in her other; Dementia in her other; Diabetes in her other; and Heart disease in her father and other. Allergies  Allergen Reactions  . Duloxetine     REACTION: GI distress  . Statins   . Topamax     dizziness   Current Outpatient Prescriptions on File Prior to Visit  Medication Sig Dispense Refill  . ALPRAZolam (XANAX) 0.5 MG tablet Take 0.5 mg by mouth. 1/2 by mouth two times a day as needed       . brimonidine-timolol (COMBIGAN) 0.2-0.5 % ophthalmic solution Place 2 drops into both eyes daily.        . fexofenadine (ALLEGRA) 60 MG tablet Take 60 mg by mouth daily.        Marland Kitchen ibuprofen (ADVIL,MOTRIN) 600 MG tablet Take 600 mg by mouth daily as needed. For pain       . DISCONTD: traMADol (ULTRAM-ER) 100 MG 24 hr tablet Take 100 mg by mouth daily as needed.        Review of Systems Review of Systems  Constitutional: Negative for diaphoresis and unexpected weight change.  HENT: Negative for drooling and tinnitus.   Eyes: Negative for photophobia and visual disturbance.  Respiratory: Negative for choking and stridor.   Gastrointestinal: Negative for vomiting and blood in stool.  Genitourinary: Negative for hematuria and decreased urine volume.  Musculoskeletal: Negative for gait problem.  Skin: Negative for color change and wound.  Neurological: Negative for tremors  and numbness.  Psychiatric/Behavioral: Negative for decreased concentration. The patient is not hyperactive.       Objective:   Physical ExamBP 102/70  Pulse 87  Temp(Src) 97.8 F (36.6 C) (Oral)  Ht 5\' 3"  (1.6 m)  Wt 187 lb (84.823 kg)  BMI 33.13 kg/m2  SpO2 99% Physical Exam  VS noted Constitutional: Pt appears well-developed and well-nourished.  HENT: Head: Normocephalic.  Right Ear: External ear normal.  Left Ear: External ear normal. nasal passages congested, no drainage Eyes: Conjunctivae and EOM are normal. Pupils are equal, round, and reactive to light.  Neck: Normal range of motion. Neck supple.    Cardiovascular: Normal rate and regular rhythm.   Pulmonary/Chest: Effort normal and breath sounds normal.  Abd:  Soft, NT, non-distended, + BS Neurological: Pt is alert. No cranial nerve deficit. motor normal, gait normal Skin: Skin is warm. No erythema.  Psychiatric: Pt behavior is normal. Thought content normal.  2+ anxious         Assessment & Plan:

## 2010-12-10 NOTE — Assessment & Plan Note (Signed)
To cont xanax prn, add the lexapro 10 mg per day, f/u any worsening symtpoms

## 2010-12-13 LAB — DIFFERENTIAL
Basophils Relative: 1 % (ref 0–1)
Eosinophils Absolute: 0.1 10*3/uL (ref 0.0–0.7)
Eosinophils Relative: 2 % (ref 0–5)
Monocytes Relative: 8 % (ref 3–12)
Neutrophils Relative %: 43 % (ref 43–77)

## 2010-12-13 LAB — URINALYSIS, ROUTINE W REFLEX MICROSCOPIC
Bilirubin Urine: NEGATIVE
Nitrite: NEGATIVE
Specific Gravity, Urine: 1.026 (ref 1.005–1.030)
pH: 7 (ref 5.0–8.0)

## 2010-12-13 LAB — CBC
Hemoglobin: 13.5 g/dL (ref 12.0–15.0)
RBC: 4.22 MIL/uL (ref 3.87–5.11)
WBC: 5.6 10*3/uL (ref 4.0–10.5)

## 2010-12-13 LAB — COMPREHENSIVE METABOLIC PANEL
ALT: 13 U/L (ref 0–35)
AST: 19 U/L (ref 0–37)
Alkaline Phosphatase: 64 U/L (ref 39–117)
CO2: 27 mEq/L (ref 19–32)
Chloride: 105 mEq/L (ref 96–112)
GFR calc non Af Amer: >60 mL/min (ref >60)
Glucose, Bld: 80 mg/dL (ref 70–99)
Potassium: 3.7 mEq/L (ref 3.5–5.1)
Sodium: 137 mEq/L (ref 135–145)

## 2010-12-13 LAB — LIPASE, BLOOD: Lipase: 25 U/L (ref 11–59)

## 2010-12-19 LAB — URINALYSIS, ROUTINE W REFLEX MICROSCOPIC
Bilirubin Urine: NEGATIVE
Nitrite: NEGATIVE
Specific Gravity, Urine: 1.03 (ref 1.005–1.030)
Urobilinogen, UA: 1 mg/dL (ref 0.0–1.0)

## 2010-12-19 LAB — DIFFERENTIAL
Basophils Absolute: 0 10*3/uL (ref 0.0–0.1)
Basophils Relative: 1 % (ref 0–1)
Neutro Abs: 2 10*3/uL (ref 1.7–7.7)
Neutrophils Relative %: 47 % (ref 43–77)

## 2010-12-19 LAB — CBC
MCHC: 33.9 g/dL (ref 30.0–36.0)
Platelets: 264 10*3/uL (ref 150–400)
RDW: 13.8 % (ref 11.5–15.5)

## 2010-12-19 LAB — POCT I-STAT, CHEM 8
Glucose, Bld: 75 mg/dL (ref 70–99)
HCT: 43 % (ref 36.0–46.0)
Hemoglobin: 14.6 g/dL (ref 12.0–15.0)
Potassium: 4.1 mEq/L (ref 3.5–5.1)
Sodium: 138 mEq/L (ref 135–145)

## 2010-12-19 LAB — WET PREP, GENITAL: Clue Cells Wet Prep HPF POC: NONE SEEN

## 2011-01-24 NOTE — H&P (Signed)
[name], [name]            ACCOUNT NO.:  [id number]   MEDICAL RECORD NO.:  [id number]          PATIENT TYPE:  INP   LOCATION:  NA                           FACILITY:  [facility]   PHYSICIAN:  Malachi Pro. Ambrose Mantle, M.D. DATE OF BIRTH:  August 23, 1966   DATE OF ADMISSION:  DATE OF DISCHARGE:                                HISTORY & PHYSICAL   HISTORY OF PRESENT ILLNESS:  This is a 45 year old white single female, para  1-0-2-1, who is admitted for vaginal hysterectomy, possible abdominal  hysterectomy, because of desire for sterilization, severe dysmenorrhea, and  severe menorrhagia.  Last menstrual period was Jan 25, 2005.  The patient's  periods occur at 28-day intervals, last 3-1/2 days, and she claims to use 10-  12 pads per day.  She states that her dysmenorrhea is severe, rating an 8 to  an 8-1/2 out of 10, and Anaprox which I have prescribed for her, has not  helped.  She desires permanent birth control and she is admitted for  hysterectomy because of her severe dysmenorrhea, menorrhagia, and probable  adenomyosis.  The patient also reports some dyspareunia.   PAST MEDICAL HISTORY:   ALLERGIES:  Allergy to CODEINE caused nausea and vomiting.   MEDICATIONS:  Nexium, Advil, Xanax p.r.n.   OPERATIONS:  1.  Left knee surgery.  2.  Right eye surgery.   ILLNESSES:  Possible multiple sclerosis.  No heart problems.   HABITS:  No alcohol or tobacco.   REVIEW OF SYSTEMS:  Headaches.   FAMILY HISTORY:  Mother is 31, living and well.  Father is 57 with heart  problems and diabetes.  One sister and three brothers, all living and well.   PHYSICAL EXAMINATION:  GENERAL:  Well-developed, well-nourished black  female, no distress.  VITAL SIGNS:  Weight 160 pounds, pulse 60, blood pressure 104/76.  HEENT:  Show no abnormalities.  NECK:  Supple without thyromegaly.  HEART:  Normal size and sounds.  No murmurs.  LUNGS:  Clear to auscultation.  BREASTS:  Soft without masses.  ABDOMEN:   Soft, nontender.  No masses are palpable.  PELVIC:  Vulva and vagina are clean.  The cervix is clean.  Uterus is  anterior, normal size.  It is tender, but it has been tender on each  examination.  Pap smear recently was within normal limits.  The adnexa are  clear.  Rectovaginal confirms the above findings.   IMPRESSION:  1.  Menorrhagia.  2.  Dysmenorrhea.  3.  Probable adenomyosis.  The patient is admitted for vaginal hysterectomy.      She has been informed that the surgery could become an abdominal      hysterectomy.  She has been informed that it is possible to have heart      attack, stroke, pulmonary embolus, wound disruption, hemorrhage with      need for reoperation and/or transfusion, distal formation, nerve injury,      and intestinal obstruction after surgery.  She understands and agrees to      proceed.      TFH/MEDQ  D:  02/10/2005  T:  02/10/2005  Job:  [id number]

## 2011-01-24 NOTE — Op Note (Signed)
NAMECHARLIENE, INOUE NO.:  [id number]   MEDICAL RECORD NO.:  [id number]          PATIENT TYPE:  INP   LOCATION:  0002                         FACILITY:  [facility]   PHYSICIAN:  Malachi Pro. Ambrose Mantle, M.D. DATE OF BIRTH:  Mar 14, 1966   DATE OF PROCEDURE:  02/11/2005  DATE OF DISCHARGE:                                 OPERATIVE REPORT   PREOPERATIVE DIAGNOSES:  Menorrhagia, dysmenorrhea, possible adenomyosis.   POSTOPERATIVE DIAGNOSES:  Menorrhagia, dysmenorrhea, possible adenomyosis.   OPERATION:  Vaginal hysterectomy.   SURGEON:  Malachi Pro. Henley.   ASSISTANT:  Senaida Ores.   ANESTHESIA:  General.   PROCEDURE:  The patient was brought to the operating room and placed under  satisfactory general anesthesia. She was then placed in a lithotomy  position. Exam revealed a small cyst close to the urethral meatus. It had  been known and is not a problem. The uterus was anterior. upper limit of  normal size, slightly irregular. The adnexa were free of masses. The vulva,  vagina, perineum, and urethra were prepped with Betadine solution and draped  as a sterile field. The weighted speculum was placed posteriorly. The cervix  was exposed and grasped with two Lahey clamps. A dilute solution of Neo-  Synephrine was injected around the cervix at the cervicovaginal junction.  A  circumferential incision was made around the cervix, and the cul-de-sac was  then identified, but I could not enter the peritoneal cavity. I pushed the  anterior vaginal mucosa ahead and did not see the peritoneal reflections, so  I took two bites through what was thought to be the uterosacral ligaments  and the cardinal ligaments extraperitoneally and held them. I then entered  the peritoneal cavity posteriorly and resutured the uterosacral ligaments  bilaterally including the peritoneum. The anterior peritoneum was identified  and entered. The parametrial  tissues were clamped, cut, and suture ligated.  and then both peritoneal leaves were incorporated and clamped bilaterally.  These were cut and suture ligated. The uterus was then inverted through the  incision in the cul-de-sac. The upper pedicles were clamped across and  doubly suture ligated. Both tubes and ovaries appeared normal. The uterus  was felt to be upper limit of normal size, but no definite fibroids were  noted. A diligent search for hemostasis was made. It was necessary to place  additional sutures along both broad ligaments, and the posterior vaginal  cuff was run with a locked suture of 0 Vicryl. At this point, all bleeding  seemed to be controlled. I could not identify the anterior peritoneum, so I  closed the peritoneal cavity by suturing the left upper pedicle, left  uterosacral ligament, posterior cul-de-sac, right uterosacral ligament, and  right upper pedicle together and tying this down. The vaginal mucosa was  then closed with  interrupted figure-of-eight sutures of 0 Vicryl. The urine was clear at the  end of the procedure. Blood loss was estimated at 250 cc. Sponge and needle  counts were correct, and the patient was returned to recovery in  satisfactory condition.       TFH/MEDQ  D:  02/11/2005  T:  02/11/2005  Job:  [id number]

## 2011-01-24 NOTE — Consult Note (Signed)
NAMELANYA, BUCKS NO.:  [id number]   MEDICAL RECORD NO.:  [id number]          PATIENT TYPE:  INP   LOCATION:  2038                         FACILITY:  [facility]   PHYSICIAN:  Anselmo Rod, M.D.  DATE OF BIRTH:  [birth year]   DATE OF CONSULTATION:  07/20/2006  DATE OF DISCHARGE:                                   CONSULTATION   REASON FOR CONSULTATION:  Chest pain with dysphagia, rule out esophagitis  versus peptic ulcer disease.   ASSESSMENT AND PLAN:  1. Chest pain.  Myocardial infarction has been ruled out during this      hospitalization.  The patient has a 4-week history of dysphagia, rule      out esophagitis versus stricture.  2. Long-standing history of reflux, initially treated with Prevacid, now      on Nexium at home.  3. History of multiple sclerosis diagnosed in 2002, followed by Dr.      Lin Givens at [facility].  4. History of chronic headaches on Advil, 2 per day.  5. History of low blood pressure in the past.  6. History of anxiety disorder, on Xanax.  7. Status post abdominal hysterectomy last year.  8. Previous history of knee surgery.  9. Previous history of eye surgery.  10.Insomnia on Ambien.   RECOMMENDATIONS:  1. EGD in the a.m.  The patient's barium swallow was normal today.  2. Continue Protonix.  3. Avoid all nonsteroidals.  4. Decreased aspirin to 81 mg per day.   DISCUSSION:  Ms. [name] is a 45 year old African-American female  with the above-mentioned problems who was admitted yesterday for chest pain,  rule out myocardial infarction.  She had a stress test this morning that was  essentially normal.  She had some tightness in her throat since she had the  procedure, but her study has turned out to be normal, as per Dr. Sharyn Lull.  She gives a history of dysphagia for the last month, especially with solids  and large pills.  She denies abdominal pain, nausea, vomiting.  There is no  history of  melena or hematochezia.  She has a history of constipation,  intermittently.  Has been stable, and her appetite is fairly good.  She  denies a family history of cancer.   PAST MEDICAL HISTORY:  See list above.   ALLERGIES:  No known drug allergies.   MEDICATIONS IN THE HOSPITAL:  1. Aspirin 324 mg per day.  2. Docusate sodium p.r.n.  3. Low molecular weight heparin (Lovenox) 80 mg subcutaneous daily.  4. Hydrocodone p.r.n.  5. Protonix 40 mg per day.  6. Nitroglycerin p.r.n.  7. Morphine sulfate p.r.n.  8. Tylenol p.r.n.  9. Senokot p.r.n.  10.Ambien p.r.n. for insomnia.  11.Zofran p.r.n. for nausea.  12.Magnesium hydroxide p.r.n. for constipation.  13.Anusol ointment.   MEDICATIONS AT HOME:  1. Calcium.  2. Aspirin.  3. Multivitamins.   SOCIAL HISTORY:  The patient is disabled.  She denies history of alcohol,  tobacco or drugs.   FAMILY HISTORY:  Noncontributory.  There is no family history of breast,  ovarian, endometrial, cervical or colon cancer.   REVIEW OF SYSTEMS:  1. Chest pain.  2. Dysphagia.  3. Constipation.  4. Reflux.   GENERAL PHYSICAL EXAMINATION:  GENERAL:  A very pleasant middle-aged African-  American female in no acute distress.  VITAL SIGNS:  Stable vital signs.  Temperature of 96.5, pulse 68 per minute,  blood pressure 106/71, respirations 20 per minute.  The patient had 100%  oxygenation on room air.  HEENT:  Head was atraumatic and normocephalic.  Oral mucosa without exudate.  NECK:  Supple.  No JVD, thyromegaly or lymphadenopathy.  CHEST:  Clear to auscultation.  S1 and S2 regular.  ABDOMEN:  Soft, nondistended, nontender with normal bowel sounds.  There is  a surgical scar present in the suprapubic transverse area from a previous  hysterectomy.  RECTAL:  Deferred, as the patient has visitors in her room.   LABORATORY EVALUATION:  White count of 5.9 with hemoglobin of 11.8 and  hematocrit 34.2.  MCV is 97.5 fl.  Platelets are 229,000.  PT  of 13.6 with  INR of 1.  PTT 27.  Sodium 136, potassium 3.5, chloride 107, CO2 of 23,  glucose 80, BUN 7, creatinine 0.6, total bilirubin 0.8.  Alkaline  phosphatase 50, AST 20, ALT 14, total protein 6.6, albumin 3.5, calcium 8.7,  magnesium 2.0.  CK-MB within normal limits.  Total cholesterol is 236 with  triglycerides  of 46.  HDL cholesterol of 60, LDL of 167, and VLDL of 9.  The patient had a  barium swallow and a stress test today, both of which were read as normal.   PLANS:  As above.  Further recommendation made after EGD has been done.      Anselmo Rod, M.D.  Electronically Signed     JNM/MEDQ  D:  07/20/2006  T:  07/20/2006  Job:  [id number]   cc:   Eduardo Osier. Sharyn Lull, M.D.

## 2011-01-24 NOTE — Discharge Summary (Signed)
[name], [name] NO.:  [id number]   MEDICAL RECORD NO.:  [id number]          PATIENT TYPE:  INP   LOCATION:  2038                         FACILITY:  [facility]   PHYSICIAN:  Eduardo Osier. Sharyn Lull, M.D. DATE OF BIRTH:  May 03, 1966   DATE OF ADMISSION:  07/19/2006  DATE OF DISCHARGE:  07/22/2006                                 DISCHARGE SUMMARY   ADMITTING DIAGNOSES:  1. Chest pain. Rule out myocardial infarction.  2. Dysphagia.  3. Multiple sclerosis.   FINAL DIAGNOSES:  1. Status post atypical chest pain. Negative Persantine Myoview.  2. Dysphagia. Workup so far negative, i.e., barium swallow and upper      endoscopy is negative.  3. Hypercholesteremia.  4. Multiple sclerosis.  5. Anxiety disorder.   DISCHARGE MEDICATIONS:  1. Prilosec 20 mg one capsule twice daily.  2. Valium 2 mg one tablet twice daily as needed.  3. Lipitor 20 mg one tablet daily.   DIET:  Low salt, low cholesterol.   FOLLOWUP:  Follow up with me in two weeks and GI, Dr. Elnoria Howard, in two weeks.   CONDITION AT DISCHARGE:  Stable.   BRIEF HISTORY AND HOSPITAL COURSE:  [name] is a 45 year old black  female with past medical history significant for multiple sclerosis. Was  admitted by Dr. Shana Chute on November 11 because of two week history of  dysphagia to solids and liquids and two day history of chest tightness.  States chest tightness occasionally resolves with NSAIDs. Denies any fever,  chills, cough, nausea, vomiting.   PAST MEDICAL HISTORY:  As above.   FAMILY HISTORY:  Noncontributory.   PHYSICAL EXAMINATION:  GENERAL:  She is alert and oriented x3.  VITAL SIGNS:  Blood pressure 93/63, pulse 68, afebrile, respirations 20.  HEENT: There is no thyromegaly.  NECK:  There was no nuchal rigidity. No masses.  LUNGS:  Clear to auscultation.  HEART:  Heart sounds, S1, S2 are normal. No S3 gallop or rub.  ABDOMEN:  Soft. Bowel sounds are present, nontender.  EXTREMITIES:  There is no  clubbing, cyanosis or edema.   LABORATORY DATA:  EKG showed normal sinus rhythm, low voltage, nonspecific T  wave changes. Her chest x-ray showed no acute cardiopulmonary disease.  Persantine Myoview showed no evidence of myocardial ischemia or infarction,  EF 65%.   Two sets of CPK-MB point of care and troponin I were negative. Cholesterol  236, LDL was very elevated at 167, HDL 60. Three sets of cardiac enzymes by  lab were negative. Two sets of troponin I were negative. Her liver enzymes  were essentially normal. Sodium 138, potassium 3.9, chloride 108, bicarb 23,  glucose 79, BUN 8, creatinine 0.8. Hemoglobin 13, hematocrit 37.8, white  count 5.8.   BRIEF HOSPITAL COURSE:  The patient was admitted to telemetry unit. MI was  ruled out by serial enzymes and EKG. The patient subsequently underwent  Persantine Myoview which showed no evidence of ischemia or infarct. GI  consultation was called for persistent dysphagia. The patient underwent  barium swallow and upper endoscopy which were negative. Spoke with the  patient and her mom.  Mom stated that multiple members in the family had  similar problems and had extensive workup which was negative. Finally  dysphagia resolved by low dose Valium. The patient will be discharged home  on the above medications and will follow with GI as outpatient in two weeks.  If she continues to have dysphagia will undergo manometric studies as  outpatient.           ______________________________  Eduardo Osier. Sharyn Lull, M.D.     MNH/MEDQ  D:  07/22/2006  T:  07/22/2006  Job:  [id number]   cc:   Elvin So, M.D.

## 2011-01-24 NOTE — Discharge Summary (Signed)
[name], OCONNOR NO.:  [id number]   MEDICAL RECORD NO.:  [id number]          PATIENT TYPE:  INP   LOCATION:  1612                         FACILITY:  [facility]   PHYSICIAN:  Malachi Pro. Ambrose Mantle, M.D. DATE OF BIRTH:  07/20/1966   DATE OF ADMISSION:  02/11/2005  DATE OF DISCHARGE:                                 DISCHARGE SUMMARY   HOSPITAL COURSE:  This is a 45 year old black female admitted for vaginal  hysterectomy because of severe menorrhagia and dysmenorrhea, probable  adenomyosis, and desired sterilization. The patient underwent a vaginal  hysterectomy on February 11, 2005 under general anesthesia by Dr. Ambrose Mantle with Dr.  Senaida Ores assisting. Postoperatively the patient did well. The catheter was  removed on the first postoperative day. She voided well without difficulty.  She ambulated well, tolerated diet, and on the evening of the first  postoperative day she was ready for discharge. Laboratory data showed  initial hemoglobin of 13.1, hematocrit 39.1, white count 4000, 43 segs, 48  lymphs, 6 monos, 2 eosinophils, and 1basophil. Platelet count 274,000.  Urinalysis was negative. Comprehensive metabolic profile was normal except  for a BUN of 3. Follow-up hematocrits were 34 and 35 on the evening of the  day of surgery and the first postoperative day. Pregnancy test was negative.  Path report is pending.   FINAL DIAGNOSES:  1.  Menorrhagia.  2.  Dysmenorrhea.  3.  Probable adenomyosis.  4.  Desire for sterilization.   OPERATION:  Vaginal hysterectomy.   FINAL CONDITION:  Improved.   Instructions include our regular discharge instructions. No vaginal  entrance, no heavy lifting or strenuous activity. Call with any temperature  elevation greater than 100.4 degrees, call with any problems. Mepergan  Fortis 24 tablets one every 4-6 hours as needed for pain is given at  discharge. The patient is to return to see me in 10 days for follow-up  examination. The  pathology report is pending.       TFH/MEDQ  D:  02/12/2005  T:  02/13/2005  Job:  [id number]

## 2011-04-23 ENCOUNTER — Encounter: Payer: Self-pay | Admitting: Internal Medicine

## 2011-04-23 ENCOUNTER — Other Ambulatory Visit (INDEPENDENT_AMBULATORY_CARE_PROVIDER_SITE_OTHER): Payer: Medicare Other

## 2011-04-23 ENCOUNTER — Other Ambulatory Visit: Payer: Self-pay | Admitting: Internal Medicine

## 2011-04-23 ENCOUNTER — Ambulatory Visit (INDEPENDENT_AMBULATORY_CARE_PROVIDER_SITE_OTHER): Payer: Medicare Other | Admitting: Internal Medicine

## 2011-04-23 VITALS — BP 110/82 | HR 79 | Temp 97.8°F | Ht 64.0 in | Wt 185.5 lb

## 2011-04-23 DIAGNOSIS — Z0001 Encounter for general adult medical examination with abnormal findings: Secondary | ICD-10-CM | POA: Insufficient documentation

## 2011-04-23 DIAGNOSIS — R5381 Other malaise: Secondary | ICD-10-CM

## 2011-04-23 DIAGNOSIS — E785 Hyperlipidemia, unspecified: Secondary | ICD-10-CM

## 2011-04-23 DIAGNOSIS — G43009 Migraine without aura, not intractable, without status migrainosus: Secondary | ICD-10-CM

## 2011-04-23 DIAGNOSIS — Z0000 Encounter for general adult medical examination without abnormal findings: Secondary | ICD-10-CM | POA: Insufficient documentation

## 2011-04-23 DIAGNOSIS — J019 Acute sinusitis, unspecified: Secondary | ICD-10-CM

## 2011-04-23 DIAGNOSIS — R5383 Other fatigue: Secondary | ICD-10-CM

## 2011-04-23 LAB — LIPID PANEL: HDL: 60.8 mg/dL (ref >39.00)

## 2011-04-23 LAB — HEPATIC FUNCTION PANEL
AST: 19 U/L (ref 0–37)
Albumin: 4 g/dL (ref 3.5–5.2)
Alkaline Phosphatase: 61 U/L (ref 39–117)
Total Protein: 7.7 g/dL (ref 6.0–8.3)

## 2011-04-23 LAB — URINALYSIS, ROUTINE W REFLEX MICROSCOPIC
Specific Gravity, Urine: >=1.03 (ref 1.000–1.030)
Urobilinogen, UA: 0.2 (ref 0.0–1.0)

## 2011-04-23 LAB — CBC WITH DIFFERENTIAL/PLATELET
Basophils Absolute: 0.1 10*3/uL (ref 0.0–0.1)
Lymphocytes Relative: 41.3 % (ref 12.0–46.0)
Monocytes Relative: 6.6 % (ref 3.0–12.0)
Neutrophils Relative %: 49.4 % (ref 43.0–77.0)
Platelets: 253 10*3/uL (ref 150.0–400.0)
RDW: 13.6 % (ref 11.5–14.6)

## 2011-04-23 LAB — BASIC METABOLIC PANEL
CO2: 29 mEq/L (ref 19–32)
Calcium: 9 mg/dL (ref 8.4–10.5)
Glucose, Bld: 83 mg/dL (ref 70–99)
Potassium: 4 mEq/L (ref 3.5–5.1)
Sodium: 140 mEq/L (ref 135–145)

## 2011-04-23 LAB — TSH: TSH: 0.42 u[IU]/mL (ref 0.35–5.50)

## 2011-04-23 MED ORDER — BRIMONIDINE TARTRATE-TIMOLOL 0.2-0.5 % OP SOLN: 2.0000 [drp] | Freq: Every day | OPHTHALMIC | Status: DC

## 2011-04-23 MED ORDER — IBUPROFEN 600 MG PO TABS: 600.0000 mg | ORAL_TABLET | Freq: Every day | ORAL | Status: DC | PRN

## 2011-04-23 MED ORDER — FLUTICASONE PROPIONATE 50 MCG/ACT NA SUSP: 2.0000 | Freq: Every day | NASAL | Status: DC

## 2011-04-23 MED ORDER — ALPRAZOLAM 0.5 MG PO TABS: ORAL_TABLET | ORAL | Status: DC

## 2011-04-23 MED ORDER — FEXOFENADINE HCL 180 MG PO TABS: 180.0000 mg | ORAL_TABLET | Freq: Every day | ORAL | Status: DC

## 2011-04-23 MED ORDER — SUMATRIPTAN SUCCINATE 100 MG PO TABS: 100.0000 mg | ORAL_TABLET | Freq: Once | ORAL | Status: DC | PRN

## 2011-04-23 MED ORDER — AZITHROMYCIN 250 MG PO TABS: ORAL_TABLET | ORAL | Status: AC

## 2011-04-23 NOTE — Patient Instructions (Signed)
Take all new medications as prescribed - the antibiotic, and the flonase You can also take Delsym OTC for cough, and/or Mucinex (or it's generic off brand) for congestion Continue all other medications as before All prescriptions were sent to the pharmacy, except for the xanax in hardcopy Please go to LAB in the Basement for the blood and/or urine tests to be done today Please call the phone number [phone] (the [employer]Tree System) for results of testing in 2-3 days;  When calling, simply dial the number, and when prompted enter the MRN number above (the Medical Record Number) and the # key, then the message should start. Please return in 6 months, or sooner if needed

## 2011-04-23 NOTE — Assessment & Plan Note (Signed)
stable overall by hx and exam, most recent data reviewed with pt, and pt to continue medical treatment as before 

## 2011-04-23 NOTE — Progress Notes (Signed)
  Subjective:    Patient ID: [name], female    DOB: [birth year], 45 y.o.   MRN: [id number]  HPI Here to f/u;  Does have several wks ongoing nasal allergy symptoms with clear congestion, itch and sneeze, with 1-2 days fever, pain, facial pain, but no ST, cough or wheezing.  Has recurring migraine, but less freq and severe in the past month.  Denies worsening depressive symptoms, suicidal ideation, or panic, though has ongoing anxiety, not increased recently.   Could not take the lexapro due to increased jitteriness, does not want to try other ssri at this time.  Does have sense of ongoing fatigue, but denies signficant hypersomnolence. Pt denies chest pain, increased sob or doe, wheezing, orthopnea, PND, increased LE swelling, palpitations, dizziness or syncope. Pt denies new neurological symptoms such as new headache, or facial or extremity weakness or numbness   Pt denies polydipsia, polyuria.   Past Medical History  Diagnosis Date  . HYPERLIPIDEMIA 03/27/2010  . ANXIETY 03/27/2010  . COMMON MIGRAINE 03/27/2010  . GERD 03/27/2010  . DISC DISEASE, CERVICAL 03/27/2010  . BACK PAIN, CHRONIC 03/27/2010  . FATIGUE 03/27/2010  . Unspecified vitamin D deficiency 10/23/2010  . Multiple sclerosis 10/23/2010   Past Surgical History  Procedure Date  . Abdominal hysterectomy   . S/p knee surgury   . S/p eye surgury   . Neg stress test 2007    reports that she has never smoked. She does not have any smokeless tobacco history on file. She reports that she does not drink alcohol or use illicit drugs. family history includes Arthritis in her other; Cancer in her other; Dementia in her other; Diabetes in her other; and Heart disease in her father and other. Allergies  Allergen Reactions  . Duloxetine     REACTION: GI distress  . Lexapro     jitteriness  . Statins   . Topamax     dizziness   No current outpatient prescriptions on file prior to visit.   Review of Systems Review of Systems    Constitutional: Negative for diaphoresis and unexpected weight change.  HENT: Negative for drooling and tinnitus.   Eyes: Negative for photophobia and visual disturbance.  Respiratory: Negative for choking and stridor.   Gastrointestinal: Negative for vomiting and blood in stool.  Genitourinary: Negative for hematuria and decreased urine volume.       Objective:   Physical Exam BP 110/82  Pulse 79  Temp(Src) 97.8 F (36.6 C) (Oral)  Ht 5\' 4"  (1.626 m)  Wt 185 lb 8 oz (84.142 kg)  BMI 31.84 kg/m2  SpO2 97% Physical Exam  VS noted Constitutional: Pt appears well-developed and well-nourished.  HENT: Head: Normocephalic.  Right Ear: External ear normal. Right TM wtihout erythema Left Ear: External ear normal. left TM mild erythema Eyes: Conjunctivae and EOM are normal. Pupils are equal, round, and reactive to light.  Neck: Normal range of motion. Neck supple.  Bilat tm's mild erythema.  Sinus tender. Left > right  Pharynx mild erythema Cardiovascular: Normal rate and regular rhythm.   Pulmonary/Chest: Effort normal and breath sounds normal.  Abd:  Soft, NT, non-distended, + BS Neurological: Pt is alert. No cranial nerve deficit.  Skin: Skin is warm. No erythema.  Psychiatric: Pt behavior is normal. Thought content normal. 1+ nervous         Assessment & Plan:

## 2011-04-23 NOTE — Assessment & Plan Note (Signed)
Etiology unclear, Exam otherwise benign, to check labs as documented, follow with expectant management  

## 2011-04-24 LAB — LDL CHOLESTEROL, DIRECT: Direct LDL: 185.2 mg/dL

## 2011-04-29 ENCOUNTER — Telehealth: Payer: Self-pay

## 2011-04-29 DIAGNOSIS — E785 Hyperlipidemia, unspecified: Secondary | ICD-10-CM

## 2011-04-29 MED ORDER — ATORVASTATIN CALCIUM 10 MG PO TABS: 10.0000 mg | ORAL_TABLET | Freq: Every day | ORAL | Status: DC

## 2011-04-29 NOTE — Telephone Encounter (Signed)
Pt called stating she heard phone tree message and is willing to start cholesterol at lowest dose possible.

## 2011-04-29 NOTE — Telephone Encounter (Signed)
Pt advised and scheduled for 4 week f/u with same day labs ordered.

## 2011-04-29 NOTE — Telephone Encounter (Signed)
Done per emr  Please have pt return in 4 wks for OV  - will need lipids and LFT's done that date

## 2011-05-26 ENCOUNTER — Ambulatory Visit (INDEPENDENT_AMBULATORY_CARE_PROVIDER_SITE_OTHER): Payer: Medicare Other | Admitting: Internal Medicine

## 2011-05-26 ENCOUNTER — Other Ambulatory Visit (INDEPENDENT_AMBULATORY_CARE_PROVIDER_SITE_OTHER): Payer: Medicare Other

## 2011-05-26 ENCOUNTER — Encounter: Payer: Self-pay | Admitting: Internal Medicine

## 2011-05-26 VITALS — BP 126/78 | HR 60 | Temp 97.9°F | Wt 194.0 lb

## 2011-05-26 DIAGNOSIS — Z79899 Other long term (current) drug therapy: Secondary | ICD-10-CM

## 2011-05-26 DIAGNOSIS — G43009 Migraine without aura, not intractable, without status migrainosus: Secondary | ICD-10-CM

## 2011-05-26 DIAGNOSIS — E785 Hyperlipidemia, unspecified: Secondary | ICD-10-CM

## 2011-05-26 DIAGNOSIS — K219 Gastro-esophageal reflux disease without esophagitis: Secondary | ICD-10-CM

## 2011-05-26 LAB — HEPATIC FUNCTION PANEL
ALT: 18 U/L (ref 0–35)
AST: 22 U/L (ref 0–37)
Bilirubin, Direct: 0.1 mg/dL (ref 0.0–0.3)
Total Protein: 7.5 g/dL (ref 6.0–8.3)

## 2011-05-26 LAB — LIPID PANEL
Cholesterol: 191 mg/dL (ref 0–200)
HDL: 67.5 mg/dL (ref >39.00)
VLDL: 7.4 mg/dL (ref 0.0–40.0)

## 2011-05-26 NOTE — Progress Notes (Signed)
Subjective:    Patient ID: [name], female    DOB: [birth year], 45 y.o.   MRN: [id number]  HPI  Here to f/u;  Feels she cannot lose wt due her debility with migraine and general weakness with the pain, has been less active, and migraines more frequent in the past 2 wks and she was concerned about increased BP as well might be causing the pain;  Has been daily for the past 2 wks, imitrex does help but cuased "weakness" for many hours, pain typical with left throbbing, photophobia, phonophobia, nausea and lots of recent stress over her son.  Has tried topamax with neurology but could not tolerate.  Needs jury duty excuse today. Denies worsening depressive symptoms, suicidal ideation, or panic, though has ongoing anxiety.   Overall good compliance with treatment, and good medicine tolerability, including the new lipitor.  Has f/u appt neurology soon. Denies worsening reflux, dysphagia, abd pain, n/v, bowel change or blood.  Past Medical History  Diagnosis Date  . HYPERLIPIDEMIA 03/27/2010  . ANXIETY 03/27/2010  . COMMON MIGRAINE 03/27/2010  . GERD 03/27/2010  . DISC DISEASE, CERVICAL 03/27/2010  . BACK PAIN, CHRONIC 03/27/2010  . FATIGUE 03/27/2010  . Unspecified vitamin D deficiency 10/23/2010  . Multiple sclerosis 10/23/2010   Past Surgical History  Procedure Date  . Abdominal hysterectomy   . S/p knee surgury   . S/p eye surgury   . Neg stress test 2007    reports that she has never smoked. She does not have any smokeless tobacco history on file. She reports that she does not drink alcohol or use illicit drugs. family history includes Arthritis in her other; Cancer in her other; Dementia in her other; Diabetes in her other; and Heart disease in her father and other. Allergies  Allergen Reactions  . Duloxetine     REACTION: GI distress  . Lexapro     jitteriness  . Statins   . Topamax     dizziness   Current Outpatient Prescriptions on File Prior to Visit  Medication Sig  Dispense Refill  . ALPRAZolam (XANAX) 0.5 MG tablet 1/2 by mouth two times a day as needed  30 tablet  5  . atorvastatin (LIPITOR) 10 MG tablet Take 1 tablet (10 mg total) by mouth daily.  90 tablet  3  . brimonidine-timolol (COMBIGAN) 0.2-0.5 % ophthalmic solution Place 2 drops into both eyes daily.  1 Bottle  1  . fexofenadine (ALLEGRA) 180 MG tablet Take 1 tablet (180 mg total) by mouth daily.  30 tablet  2  . fluticasone (FLONASE) 50 MCG/ACT nasal spray Place 2 sprays into the nose daily.  16 g  2  . ibuprofen (ADVIL,MOTRIN) 600 MG tablet Take 1 tablet (600 mg total) by mouth daily as needed. For pain  40 tablet  5  . SUMAtriptan (IMITREX) 100 MG tablet Take 1 tablet (100 mg total) by mouth once as needed for migraine.  9 tablet  5   Review of Systems Review of Systems  Constitutional: Negative for diaphoresis and unexpected weight change.  HENT: Negative for drooling and tinnitus.   Eyes: Negative for photophobia and visual disturbance.  Respiratory: Negative for choking and stridor.   Gastrointestinal: Negative for vomiting and blood in stool.  Genitourinary: Negative for hematuria and decreased urine volume.    Objective:   Physical Exam BP 126/78  Pulse 60  Temp(Src) 97.9 F (36.6 C) (Oral)  Wt 194 lb (87.998 kg)  SpO2 99% Physical Exam  VS noted Constitutional: Pt appears well-developed and well-nourished.  HENT: Head: Normocephalic.  Right Ear: External ear normal.  Left Ear: External ear normal.  Eyes: Conjunctivae and EOM are normal. Pupils are equal, round, and reactive to light.  Neck: Normal range of motion. Neck supple.  Cardiovascular: Normal rate and regular rhythm.   Pulmonary/Chest: Effort normal and breath sounds normal.  Abd:  Soft, NT, non-distended, + BS Neurological: Pt is alert. No cranial nerve deficit. Motor/sens/dtr neg Skin: Skin is warm. No erythema.  Psychiatric: Pt behavior is normal. Thought content normal. 1+ nervous    Assessment & Plan:

## 2011-05-26 NOTE — Assessment & Plan Note (Signed)
With some increased freq recently, did not tolerate the topamax before;  Declines other med trial, Continue all other medications as before; did give excuse for jury duty,  Pt to f/u with neurology as planned

## 2011-05-26 NOTE — Patient Instructions (Addendum)
Continue all other medications as before Please go to LAB in the Basement for the blood and/or urine tests to be done today Please call the phone number [phone] (the PhoneTree System) for results of testing in 2-3 days;  When calling, simply dial the number, and when prompted enter the MRN number above (the Medical Record Number) and the # key, then the message should start. You are given the jury duty letter today  OK to Cancel the appt next wk

## 2011-05-26 NOTE — Assessment & Plan Note (Signed)
stable overall by hx and exam, most recent data reviewed with pt, and pt to continue medical treatment as before  Lab Results  Component Value Date   WBC 6.1 04/23/2011   HGB 13.4 04/23/2011   HCT 40.0 04/23/2011   PLT 253.0 04/23/2011   CHOL 251* 04/23/2011   TRIG 52.0 04/23/2011   HDL 60.80 04/23/2011   LDLDIRECT 185.2 04/23/2011   ALT 12 04/23/2011   AST 19 04/23/2011   NA 140 04/23/2011   K 4.0 04/23/2011   CL 106 04/23/2011   CREATININE 0.6 04/23/2011   BUN 13 04/23/2011   CO2 29 04/23/2011   TSH 0.42 04/23/2011

## 2011-05-26 NOTE — Assessment & Plan Note (Signed)
Marked elev recently; stable overall by hx and exam, most recent data reviewed with pt, and pt to continue medical treatment as before  No results found for this basename: LDLCALC    to check f/u ldl- goal <100

## 2011-05-26 NOTE — Assessment & Plan Note (Signed)
Also for LFT's related to new statin use;   to f/u any worsening symptoms or concerns

## 2011-05-29 ENCOUNTER — Ambulatory Visit: Payer: Medicare Other | Admitting: Internal Medicine

## 2011-05-30 ENCOUNTER — Telehealth: Payer: Self-pay

## 2011-05-30 NOTE — Telephone Encounter (Signed)
Ok for letter to mention;  Multiple Sclerosis, chronic back pain, chronic recurring migraine

## 2011-05-30 NOTE — Telephone Encounter (Signed)
Completed letter faxed as requested, informed patient letter has been faxed and mailed a copy  Of the letter to the patient as she requested.

## 2011-05-30 NOTE — Telephone Encounter (Signed)
Pt called requesting updated letter to include the specific details of the reasons she cannot serve for [employer]. Jury Duty ID # [id number].   Attn Roque Lias. Fax # [phone].

## 2011-06-03 LAB — HEPATIC FUNCTION PANEL
Albumin: 3.4 — ABNORMAL LOW
Bilirubin, Direct: 0.1
Indirect Bilirubin: 0.7
Total Bilirubin: 0.8

## 2011-10-11 ENCOUNTER — Other Ambulatory Visit: Payer: Self-pay | Admitting: Internal Medicine

## 2011-10-24 ENCOUNTER — Ambulatory Visit: Payer: Medicare Other | Admitting: Internal Medicine

## 2011-10-27 DIAGNOSIS — H4011X0 Primary open-angle glaucoma, stage unspecified: Secondary | ICD-10-CM | POA: Diagnosis not present

## 2011-10-28 ENCOUNTER — Ambulatory Visit: Payer: Medicare Other | Admitting: Internal Medicine

## 2011-11-10 ENCOUNTER — Ambulatory Visit: Payer: Medicare Other | Admitting: Internal Medicine

## 2011-12-08 ENCOUNTER — Other Ambulatory Visit: Payer: Self-pay

## 2011-12-08 MED ORDER — ALPRAZOLAM 0.5 MG PO TABS: ORAL_TABLET | ORAL | Status: DC

## 2011-12-08 NOTE — Telephone Encounter (Signed)
Done hardcopy to robin  

## 2011-12-08 NOTE — Telephone Encounter (Signed)
Faxed hardcopy to pharmacy. 

## 2012-01-20 ENCOUNTER — Telehealth: Payer: Self-pay

## 2012-01-20 MED ORDER — LOVASTATIN 40 MG PO TABS: 40.0000 mg | ORAL_TABLET | Freq: Every day | ORAL | Status: DC

## 2012-01-20 NOTE — Telephone Encounter (Signed)
Ok to change to lovastatin 40 mg (which is approx as effective as the lipitor 10) - done per emr

## 2012-01-20 NOTE — Telephone Encounter (Signed)
Patient informed. 

## 2012-01-20 NOTE — Telephone Encounter (Signed)
Pt called requesting an alternative to Lipitor. Pt is concerned about recent advertisement stating that this medication increases the risk of developing diabetes in women. Please advise.

## 2012-01-23 ENCOUNTER — Telehealth: Payer: Self-pay

## 2012-01-23 NOTE — Telephone Encounter (Signed)
The patient picked up cholesterol medication today at pharmacy and stated she cannot take  40 mg cholesterol medication due to side effects.  She states she can only take 10 mg . Please advise

## 2012-01-23 NOTE — Telephone Encounter (Signed)
Ok to change to 10 - to robin to handle

## 2012-01-26 MED ORDER — LOVASTATIN 10 MG PO TABS: 10.0000 mg | ORAL_TABLET | Freq: Every day | ORAL | Status: DC

## 2012-01-26 NOTE — Telephone Encounter (Signed)
Patient informed and sent new prescription to pharmacy

## 2012-02-10 DIAGNOSIS — H4011X0 Primary open-angle glaucoma, stage unspecified: Secondary | ICD-10-CM | POA: Diagnosis not present

## 2012-02-25 DIAGNOSIS — H4011X0 Primary open-angle glaucoma, stage unspecified: Secondary | ICD-10-CM | POA: Diagnosis not present

## 2012-06-18 ENCOUNTER — Other Ambulatory Visit: Payer: Self-pay | Admitting: Internal Medicine

## 2012-06-18 NOTE — Telephone Encounter (Signed)
Faxed hardcopy to pharmacy and called the patient left detailed message to schedule ROV for further refills.

## 2012-06-18 NOTE — Telephone Encounter (Signed)
Done hardcopy to robin  Robin to let pt know - due for OV 

## 2012-07-03 ENCOUNTER — Emergency Department (HOSPITAL_COMMUNITY): Payer: Medicare Other

## 2012-07-03 ENCOUNTER — Emergency Department (HOSPITAL_COMMUNITY)
Admission: EM | Admit: 2012-07-03 | Discharge: 2012-07-03 | Disposition: A | Discharge status: Home / Self Care | Payer: Medicare Other | Attending: Emergency Medicine | Admitting: Emergency Medicine

## 2012-07-03 ENCOUNTER — Encounter (HOSPITAL_COMMUNITY): Payer: Self-pay | Admitting: Emergency Medicine

## 2012-07-03 DIAGNOSIS — E785 Hyperlipidemia, unspecified: Secondary | ICD-10-CM | POA: Insufficient documentation

## 2012-07-03 DIAGNOSIS — M25579 Pain in unspecified ankle and joints of unspecified foot: Secondary | ICD-10-CM | POA: Insufficient documentation

## 2012-07-03 DIAGNOSIS — G35 Multiple sclerosis: Secondary | ICD-10-CM | POA: Insufficient documentation

## 2012-07-03 DIAGNOSIS — S92309A Fracture of unspecified metatarsal bone(s), unspecified foot, initial encounter for closed fracture: Secondary | ICD-10-CM | POA: Diagnosis not present

## 2012-07-03 DIAGNOSIS — W010XXA Fall on same level from slipping, tripping and stumbling without subsequent striking against object, initial encounter: Secondary | ICD-10-CM | POA: Insufficient documentation

## 2012-07-03 DIAGNOSIS — S92353A Displaced fracture of fifth metatarsal bone, unspecified foot, initial encounter for closed fracture: Secondary | ICD-10-CM

## 2012-07-03 DIAGNOSIS — Y9289 Other specified places as the place of occurrence of the external cause: Secondary | ICD-10-CM | POA: Insufficient documentation

## 2012-07-03 DIAGNOSIS — M259 Joint disorder, unspecified: Secondary | ICD-10-CM

## 2012-07-03 DIAGNOSIS — Z8739 Personal history of other diseases of the musculoskeletal system and connective tissue: Secondary | ICD-10-CM | POA: Insufficient documentation

## 2012-07-03 DIAGNOSIS — K219 Gastro-esophageal reflux disease without esophagitis: Secondary | ICD-10-CM | POA: Insufficient documentation

## 2012-07-03 DIAGNOSIS — Y9389 Activity, other specified: Secondary | ICD-10-CM | POA: Insufficient documentation

## 2012-07-03 DIAGNOSIS — G8929 Other chronic pain: Secondary | ICD-10-CM | POA: Insufficient documentation

## 2012-07-03 DIAGNOSIS — S8990XA Unspecified injury of unspecified lower leg, initial encounter: Secondary | ICD-10-CM | POA: Diagnosis not present

## 2012-07-03 DIAGNOSIS — M5030 Other cervical disc degeneration, unspecified cervical region: Secondary | ICD-10-CM | POA: Insufficient documentation

## 2012-07-03 DIAGNOSIS — Z79899 Other long term (current) drug therapy: Secondary | ICD-10-CM | POA: Insufficient documentation

## 2012-07-03 DIAGNOSIS — S99919A Unspecified injury of unspecified ankle, initial encounter: Secondary | ICD-10-CM | POA: Diagnosis not present

## 2012-07-03 MED ORDER — HYDROCODONE-ACETAMINOPHEN 5-325 MG PO TABS: 2.0000 | ORAL_TABLET | ORAL | Status: DC | PRN

## 2012-07-03 MED ORDER — OXYCODONE-ACETAMINOPHEN 5-325 MG PO TABS
2.0000 | ORAL_TABLET | Freq: Once | ORAL | Status: AC
  Administered 2012-07-03: 2 via ORAL
  Filled 2012-07-03: qty 2

## 2012-07-03 NOTE — ED Notes (Signed)
Pt back from x-ray.

## 2012-07-03 NOTE — ED Notes (Signed)
Pt awaiting on ortho splint, ortho tech on call paged, will be here as soon as possible.

## 2012-07-03 NOTE — ED Notes (Addendum)
Per PT she slipped yesterday and twisted her right foot. Pt c/o pain that is radiating from her foot up to her thigh.Pt sts can't bear any weight on the injured foot. Slight swelling noted to ankle and side of the right foot. Tender to touch.

## 2012-07-03 NOTE — ED Notes (Signed)
Patient transported to X-ray 

## 2012-07-03 NOTE — ED Notes (Signed)
Ortho tech is in the room applying the splint.

## 2012-07-03 NOTE — ED Provider Notes (Signed)
History     CSN: [id number]  Arrival date & time 07/03/12  1191   First MD Initiated Contact with Patient 07/03/12 [phone]      Chief Complaint  Patient presents with  . Foot Pain    (Consider location/radiation/quality/duration/timing/severity/associated sxs/prior treatment) HPIGloria L Morales is a 46 y.o. female presenting with ankle pain/foot pain. Patient slipped off the running board of her SUV yesterday of getting groceries out of her car, rolled her ankle then twisted on it. Pain has gotten progressively worse, is 10/10, sharp, localized to the lateral aspect of the right foot. Unable to bear week of the foot. She denies any other pain, denies knee pain, lateral knee pain, ankle pain.   Past Medical History  Diagnosis Date  . HYPERLIPIDEMIA 03/27/2010  . ANXIETY 03/27/2010  . COMMON MIGRAINE 03/27/2010  . GERD 03/27/2010  . DISC DISEASE, CERVICAL 03/27/2010  . BACK PAIN, CHRONIC 03/27/2010  . FATIGUE 03/27/2010  . Unspecified vitamin D deficiency 10/23/2010  . Multiple sclerosis 10/23/2010    Past Surgical History  Procedure Date  . Abdominal hysterectomy   . S/p knee surgury   . S/p eye surgury   . Neg stress test 2007    Family History  Problem Relation Age of Onset  . Heart disease Father   . Diabetes Other   . Arthritis Other   . Dementia Other   . Cancer Other     2 uncles with lung cancer  . Heart disease Other     2 uncles with heart disease    History  Substance Use Topics  . Smoking status: Never Smoker   . Smokeless tobacco: Not on file  . Alcohol Use: No    OB History    Grav Para Term Preterm Abortions TAB SAB Ect Mult Living                  Review of Systems At least 10pt or greater review of systems completed and are negative except where specified in the HPI.  Allergies  Duloxetine; Escitalopram oxalate; Statins; and Topamax  Home Medications   Current Outpatient Rx  Name Route Sig Dispense Refill  . ALPRAZOLAM 0.5 MG PO TABS Oral  Take 0.25 mg by mouth 2 (two) times daily as needed. For anxiety.    Marland Kitchen BRIMONIDINE TARTRATE-TIMOLOL 0.2-0.5 % OP SOLN Both Eyes Place 2 drops into both eyes daily.    . IBUPROFEN 200 MG PO TABS Oral Take 800 mg by mouth every 8 (eight) hours as needed. For pain.    Marland Kitchen LOVASTATIN 10 MG PO TABS Oral Take 10 mg by mouth at bedtime.    . ADULT MULTIVITAMIN W/MINERALS CH Oral Take 1 tablet by mouth daily.      BP 99/66  Pulse 71  Temp 98.3 F (36.8 C) (Oral)  Resp 16  SpO2 100%  Physical Exam  Nursing notes reviewed.  Electronic medical record reviewed. VITAL SIGNS:   Filed Vitals:   07/03/12 0827 07/03/12 0840  BP: 91/56 99/66  Pulse: 69 71  Temp: 98.4 F (36.9 C) 98.3 F (36.8 C)  TempSrc: Oral Oral  Resp: 18 16  SpO2: 99% 100%   CONSTITUTIONAL: Awake, oriented, appears non-toxic HENT: Atraumatic, normocephalic, oral mucosa pink and moist, airway patent. Nares patent without drainage. External ears normal. EYES: Conjunctiva clear, EOMI, PERRLA NECK: Trachea midline, non-tender, supple CARDIOVASCULAR: Normal heart rate, Normal rhythm, No murmurs, rubs, gallops PULMONARY/CHEST: Clear to auscultation, no rhonchi, wheezes, or rales. Symmetrical breath sounds.  Non-tender. ABDOMINAL: Non-distended, soft, non-tender - no rebound or guarding.  BS normal. NEUROLOGIC: Non-focal, moving all four extremities, no gross sensory or motor deficits. EXTREMITIES: No clubbing, cyanosis. All amount of tenderness and swelling of the ATFL. Large amount of swelling and very tender to palpation along the fifth metatarsal. Mild paresthesia over swollen area locally, otherwise distally neurovascularly intact.  SKIN: Warm, Dry, No erythema, No rash  ED Course  Procedures (including critical care time)  Labs Reviewed - No data to display Dg Ankle Complete Right  07/03/2012  *RADIOLOGY REPORT*  Clinical Data: Right foot injury.  RIGHT ANKLE - COMPLETE 3+ VIEW  Comparison: Plain film 10/26 1013   Findings: Ankle mortise intact.  There is mild widening of the medial ankle mortise.  This is seen on two views.  The talar dome is normal.  No malleolar fracture.  No joint effusion.  No calcaneal fracture noted.  The fifth metatarsal fractures is not identified on this exam.  IMPRESSION:  1.  No evidence of fracture. 2.  Mild widening of the medial aspect the ankle mortise.  Cannot exclude ligamentous injury.   Original Report Authenticated By: Genevive Bi, M.D.    Dg Foot Complete Right  07/03/2012  *RADIOLOGY REPORT*  Clinical Data: Pain swelling  RIGHT FOOT COMPLETE - 3+ VIEW  Comparison: None.  Findings: Oblique fracture of the midshaft right fifth metatarsal, distracted 1 mm with 2-3 mm foreshortening.  No extension of fracture line to the articular surface.  No other bony abnormality. Otherwise normal mineralization and alignment.  No significant osseous degenerative change.  IMPRESSION:  Minimally-displaced oblique fracture, right fifth metatarsal shaft.   Original Report Authenticated By: Thora Lance III, M.D.      1. Fracture of fifth metatarsal bone   2. Ankle problem       MDM  [name] is a 46 y.o. female presenting with foot fracture to the right fifth metatarsal. Also some medial ankle mortise widening - of undetermined significance, discussed with orthopedics Dr.Olin who advises placing the patient in a Cam Walker boot. Patient refuses Cam Walker boot and would like a short leg splint, she says he will not wear the Cam Walker boot as soon as she gets home she'll take it off. Eyes her not to place any weight on the right foot and use crutches. She should take care with using pain medicine. Her foot is neurovascularly intact after splint application.  I explained the diagnosis and have given explicit precautions to return to the ER including worsening pain or any other new or worsening symptoms. The patient understands and accepts the medical plan as it's been dictated  and I have answered their questions. Discharge instructions concerning home care and prescriptions have been given.  The patient is STABLE and is discharged to home in good condition.          Jones Skene, MD 07/03/12 1230

## 2012-07-03 NOTE — ED Notes (Signed)
Per patient, twisted right foot yesterday while getting groceries out of car-unable to bear weight

## 2012-07-05 DIAGNOSIS — S92309A Fracture of unspecified metatarsal bone(s), unspecified foot, initial encounter for closed fracture: Secondary | ICD-10-CM | POA: Diagnosis not present

## 2012-07-12 DIAGNOSIS — S92309A Fracture of unspecified metatarsal bone(s), unspecified foot, initial encounter for closed fracture: Secondary | ICD-10-CM | POA: Diagnosis not present

## 2012-07-29 DIAGNOSIS — S92309A Fracture of unspecified metatarsal bone(s), unspecified foot, initial encounter for closed fracture: Secondary | ICD-10-CM | POA: Diagnosis not present

## 2012-08-04 ENCOUNTER — Ambulatory Visit (INDEPENDENT_AMBULATORY_CARE_PROVIDER_SITE_OTHER): Payer: Medicare Other | Admitting: Internal Medicine

## 2012-08-04 ENCOUNTER — Encounter: Payer: Self-pay | Admitting: Internal Medicine

## 2012-08-04 ENCOUNTER — Other Ambulatory Visit (INDEPENDENT_AMBULATORY_CARE_PROVIDER_SITE_OTHER): Payer: Medicare Other

## 2012-08-04 VITALS — BP 110/80 | HR 75 | Temp 96.9°F | Ht 63.5 in | Wt 205.0 lb

## 2012-08-04 DIAGNOSIS — M949 Disorder of cartilage, unspecified: Secondary | ICD-10-CM | POA: Diagnosis not present

## 2012-08-04 DIAGNOSIS — E785 Hyperlipidemia, unspecified: Secondary | ICD-10-CM

## 2012-08-04 DIAGNOSIS — S92909A Unspecified fracture of unspecified foot, initial encounter for closed fracture: Secondary | ICD-10-CM

## 2012-08-04 DIAGNOSIS — M8580 Other specified disorders of bone density and structure, unspecified site: Secondary | ICD-10-CM

## 2012-08-04 DIAGNOSIS — F411 Generalized anxiety disorder: Secondary | ICD-10-CM

## 2012-08-04 DIAGNOSIS — M899 Disorder of bone, unspecified: Secondary | ICD-10-CM

## 2012-08-04 LAB — LIPID PANEL
Cholesterol: 260 mg/dL — ABNORMAL HIGH (ref 0–200)
Total CHOL/HDL Ratio: 5
Triglycerides: 121 mg/dL (ref 0.0–149.0)

## 2012-08-04 LAB — HEPATIC FUNCTION PANEL
Albumin: 3.7 g/dL (ref 3.5–5.2)
Bilirubin, Direct: 0.1 mg/dL (ref 0.0–0.3)
Total Protein: 7.6 g/dL (ref 6.0–8.3)

## 2012-08-04 LAB — BASIC METABOLIC PANEL
CO2: 29 mEq/L (ref 19–32)
Calcium: 9.1 mg/dL (ref 8.4–10.5)
Creatinine, Ser: 0.6 mg/dL (ref 0.4–1.2)

## 2012-08-04 LAB — URINALYSIS, ROUTINE W REFLEX MICROSCOPIC
Nitrite: NEGATIVE
Specific Gravity, Urine: >=1.03 (ref 1.000–1.030)
Total Protein, Urine: NEGATIVE
Urine Glucose: NEGATIVE

## 2012-08-04 LAB — CBC WITH DIFFERENTIAL/PLATELET
Basophils Relative: 0.6 % (ref 0.0–3.0)
Eosinophils Relative: 1.5 % (ref 0.0–5.0)
Hemoglobin: 12.9 g/dL (ref 12.0–15.0)
Lymphocytes Relative: 40.9 % (ref 12.0–46.0)
MCHC: 32.8 g/dL (ref 30.0–36.0)
Monocytes Relative: 5.4 % (ref 3.0–12.0)
Neutro Abs: 3.2 10*3/uL (ref 1.4–7.7)
RBC: 4.16 Mil/uL (ref 3.87–5.11)
WBC: 6.3 10*3/uL (ref 4.5–10.5)

## 2012-08-04 MED ORDER — LOVASTATIN 10 MG PO TABS: 10.0000 mg | ORAL_TABLET | Freq: Every day | ORAL | Status: DC

## 2012-08-04 MED ORDER — IBUPROFEN 200 MG PO TABS: 800.0000 mg | ORAL_TABLET | Freq: Three times a day (TID) | ORAL | Status: DC | PRN

## 2012-08-04 MED ORDER — ALPRAZOLAM 0.5 MG PO TABS: 0.2500 mg | ORAL_TABLET | Freq: Two times a day (BID) | ORAL | Status: DC | PRN

## 2012-08-04 NOTE — Patient Instructions (Addendum)
Please schedule the bone density with the schedulers at the checkout desk as you leave today Your medications were refilled today as requested Please have the pharmacy call with any other refills you may need. Please go to LAB in the Basement for the blood and/or urine tests to be done today You will be contacted by phone if any changes need to be made immediately.  Otherwise, you will receive a letter about your results with an explanation, but please check with MyChart first. Thank you for enrolling in MyChart. Please follow the instructions below to securely access your online medical record. MyChart allows you to send messages to your doctor, view your test results, renew your prescriptions, schedule appointments, and more. To Log into MyChart, please go to [email or web address], and your Username is:  [email or web address] Please return in 1 year for your yearly visit, or sooner if needed

## 2012-08-04 NOTE — Assessment & Plan Note (Signed)
Mult x 3 to left foot hx, as well as new spontaneous fx to right foot recent as well;  For dxa/pth/calcium/vit d, and f/u  -? Need orthotics eventually to help stablize feet in light of her obesity

## 2012-08-06 ENCOUNTER — Encounter: Payer: Self-pay | Admitting: Internal Medicine

## 2012-08-06 LAB — PTH, INTACT AND CALCIUM: PTH: 47.9 pg/mL (ref 14.0–72.0)

## 2012-08-06 NOTE — Assessment & Plan Note (Signed)
stable overall by hx and exam, most recent data reviewed with pt, and pt to continue medical treatment as before Lab Results  Component Value Date   WBC 6.3 08/04/2012   HGB 12.9 08/04/2012   HCT 39.3 08/04/2012   PLT 259.0 08/04/2012   GLUCOSE 89 08/04/2012   CHOL 260* 08/04/2012   TRIG 121.0 08/04/2012   HDL 57.20 08/04/2012   LDLDIRECT 184.9 08/04/2012   LDLCALC 116* 05/26/2011   ALT 17 08/04/2012   AST 23 08/04/2012   NA 136 08/04/2012   K 4.0 08/04/2012   CL 102 08/04/2012   CREATININE 0.6 08/04/2012   BUN 9 08/04/2012   CO2 29 08/04/2012   TSH 0.89 08/04/2012

## 2012-08-06 NOTE — Assessment & Plan Note (Signed)
stable overall by hx and exam, most recent data reviewed with pt, and pt to continue medical treatment as before Lab Results  Component Value Date   LDLCALC 116* 05/26/2011

## 2012-08-06 NOTE — Progress Notes (Signed)
Subjective:    Patient ID: [name], female    DOB: [birth year], 46 y.o.   MRN: [id number]  HPI  Here to f/u; overall doing ok,  Pt denies chest pain, increased sob or doe, wheezing, orthopnea, PND, increased LE swelling, palpitations, dizziness or syncope.  Pt denies new neurological symptoms such as new headache, or facial or extremity weakness or numbness   Pt denies polydipsia, polyuria, or low sugar symptoms such as weakness or confusion improved with po intake.  Pt states overall good compliance with meds, trying to follow lower cholesterol diet, wt overall stable but little exercise however, especially since she had what sounds like another spontaneous foot fracture recently, now casted.  Has hx of similar fx x 3 to left foot.  Has no known bone disease, did have 6 mo steroid tx for what was diagnosed as MS in 2006 (now diagnosis questioned).  Denies worsening depressive symptoms, suicidal ideation, or panic, though has ongoing anxiety, not increased recently.   Needs med refilled.   Past Medical History  Diagnosis Date  . HYPERLIPIDEMIA 03/27/2010  . ANXIETY 03/27/2010  . COMMON MIGRAINE 03/27/2010  . GERD 03/27/2010  . DISC DISEASE, CERVICAL 03/27/2010  . BACK PAIN, CHRONIC 03/27/2010  . FATIGUE 03/27/2010  . Unspecified vitamin D deficiency 10/23/2010  . Multiple sclerosis 10/23/2010   Past Surgical History  Procedure Date  . Abdominal hysterectomy   . S/p knee surgury   . S/p eye surgury   . Neg stress test 2007    reports that she has never smoked. She does not have any smokeless tobacco history on file. She reports that she does not drink alcohol or use illicit drugs. family history includes Arthritis in her other; Cancer in her other; Dementia in her other; Diabetes in her other; and Heart disease in her father and other. Allergies  Allergen Reactions  . Duloxetine     REACTION: GI distress  . Escitalopram Oxalate     jitteriness  . Statins   . Topamax     dizziness    Current Outpatient Prescriptions on File Prior to Visit  Medication Sig Dispense Refill  . brimonidine-timolol (COMBIGAN) 0.2-0.5 % ophthalmic solution Place 2 drops into both eyes daily.      Marland Kitchen HYDROcodone-acetaminophen (NORCO/VICODIN) 5-325 MG per tablet Take 2 tablets by mouth every 4 (four) hours as needed for pain.  17 tablet  0  . Multiple Vitamin (MULTIVITAMIN WITH MINERALS) TABS Take 1 tablet by mouth daily.       Review of Systems  Constitutional: Negative for diaphoresis and unexpected weight change.  HENT: Negative for tinnitus.   Eyes: Negative for photophobia and visual disturbance.  Respiratory: Negative for choking and stridor.   Gastrointestinal: Negative for vomiting and blood in stool.  Genitourinary: Negative for hematuria and decreased urine volume.  Musculoskeletal: Negative for gait problem.  Skin: Negative for color change and wound.  Neurological: Negative for tremors and numbness.  Psychiatric/Behavioral: Negative for decreased concentration. The patient is not hyperactive.       Objective:   Physical Exam BP 110/80  Pulse 75  Temp 96.9 F (36.1 C) (Oral)  Ht 5' 3.5" (1.613 m)  Wt 205 lb (92.987 kg)  BMI 35.74 kg/m2  SpO2 99% Physical Exam  VS noted Constitutional: Pt appears well-developed and well-nourished.  HENT: Head: Normocephalic.  Right Ear: External ear normal.  Left Ear: External ear normal.  Eyes: Conjunctivae and EOM are normal. Pupils are equal, round, and reactive to  light.  Neck: Normal range of motion. Neck supple.  Cardiovascular: Normal rate and regular rhythm.   Pulmonary/Chest: Effort normal and breath sounds normal.  Abd:  Soft, NT, non-distended, + BS Neurological: Pt is alert. Not confused  Skin: Skin is warm. No erythema.  Psychiatric: Pt behavior is normal. Thought content normal. mild nervous Right foot/distal leg casted    Assessment & Plan:

## 2012-08-18 ENCOUNTER — Other Ambulatory Visit: Payer: Self-pay | Admitting: Internal Medicine

## 2012-08-26 DIAGNOSIS — S92309A Fracture of unspecified metatarsal bone(s), unspecified foot, initial encounter for closed fracture: Secondary | ICD-10-CM | POA: Diagnosis not present

## 2012-08-30 DIAGNOSIS — H353290 Exudative age-related macular degeneration, unspecified eye, stage unspecified: Secondary | ICD-10-CM | POA: Diagnosis not present

## 2012-08-30 DIAGNOSIS — H31009 Unspecified chorioretinal scars, unspecified eye: Secondary | ICD-10-CM | POA: Diagnosis not present

## 2012-08-30 DIAGNOSIS — H353190 Nonexudative age-related macular degeneration, unspecified eye, stage unspecified: Secondary | ICD-10-CM | POA: Diagnosis not present

## 2012-08-30 DIAGNOSIS — H43399 Other vitreous opacities, unspecified eye: Secondary | ICD-10-CM | POA: Diagnosis not present

## 2012-09-03 ENCOUNTER — Other Ambulatory Visit: Payer: Self-pay | Admitting: Internal Medicine

## 2012-09-03 ENCOUNTER — Inpatient Hospital Stay: Admission: RE | Admit: 2012-09-03 | Payer: Medicare Other | Source: Ambulatory Visit

## 2012-09-03 DIAGNOSIS — Z1231 Encounter for screening mammogram for malignant neoplasm of breast: Secondary | ICD-10-CM | POA: Diagnosis not present

## 2012-09-06 ENCOUNTER — Ambulatory Visit (INDEPENDENT_AMBULATORY_CARE_PROVIDER_SITE_OTHER)
Admission: RE | Admit: 2012-09-06 | Discharge: 2012-09-06 | Disposition: A | Discharge status: Home / Self Care | Payer: Medicare Other | Source: Ambulatory Visit | Attending: Internal Medicine | Admitting: Internal Medicine

## 2012-09-06 DIAGNOSIS — M949 Disorder of cartilage, unspecified: Secondary | ICD-10-CM

## 2012-09-06 DIAGNOSIS — M8580 Other specified disorders of bone density and structure, unspecified site: Secondary | ICD-10-CM

## 2012-09-06 DIAGNOSIS — M899 Disorder of bone, unspecified: Secondary | ICD-10-CM | POA: Diagnosis not present

## 2012-09-20 ENCOUNTER — Encounter: Payer: Self-pay | Admitting: Internal Medicine

## 2012-09-24 ENCOUNTER — Encounter: Payer: Self-pay | Admitting: Internal Medicine

## 2012-09-24 DIAGNOSIS — M810 Age-related osteoporosis without current pathological fracture: Secondary | ICD-10-CM

## 2012-09-24 HISTORY — DX: Age-related osteoporosis without current pathological fracture: M81.0

## 2012-10-23 ENCOUNTER — Other Ambulatory Visit: Payer: Self-pay

## 2012-11-15 DIAGNOSIS — H4011X0 Primary open-angle glaucoma, stage unspecified: Secondary | ICD-10-CM | POA: Diagnosis not present

## 2013-03-16 ENCOUNTER — Other Ambulatory Visit: Payer: Self-pay | Admitting: Internal Medicine

## 2013-03-16 ENCOUNTER — Telehealth: Payer: Self-pay

## 2013-03-16 MED ORDER — HYDROCHLOROTHIAZIDE 12.5 MG PO CAPS: 12.5000 mg | ORAL_CAPSULE | Freq: Every day | ORAL | Status: DC

## 2013-03-16 NOTE — Telephone Encounter (Signed)
Patient informed. 

## 2013-03-16 NOTE — Telephone Encounter (Signed)
The patient is having some ankle swelling and would like a fluid pill sent in.  She did state PCP has never prescribed a fluid pill for her in the past but she has received from another MD and is out.  Please advise

## 2013-03-16 NOTE — Telephone Encounter (Signed)
Done erx 

## 2013-03-17 NOTE — Telephone Encounter (Signed)
Faxed hardcopy to [facility] [address] 

## 2013-03-17 NOTE — Telephone Encounter (Signed)
Done hardcopy to robin  

## 2013-03-24 DIAGNOSIS — M25569 Pain in unspecified knee: Secondary | ICD-10-CM | POA: Diagnosis not present

## 2013-03-24 DIAGNOSIS — M9420 Chondromalacia, unspecified site: Secondary | ICD-10-CM | POA: Diagnosis not present

## 2013-04-10 ENCOUNTER — Other Ambulatory Visit: Payer: Self-pay | Admitting: Internal Medicine

## 2013-04-11 NOTE — Telephone Encounter (Signed)
Faxed hardcopy to [facility] [address] 

## 2013-04-11 NOTE — Telephone Encounter (Signed)
Done hardcopy to robin  

## 2013-04-14 ENCOUNTER — Other Ambulatory Visit: Payer: Self-pay | Admitting: Internal Medicine

## 2013-04-15 MED ORDER — HYDROCODONE-ACETAMINOPHEN 5-325 MG PO TABS: 1.0000 | ORAL_TABLET | Freq: Three times a day (TID) | ORAL | Status: DC | PRN

## 2013-04-15 NOTE — Telephone Encounter (Signed)
Done hardcopy to robin  

## 2013-04-15 NOTE — Telephone Encounter (Signed)
Faxed hardcopy to [facility] [address] [place] 

## 2013-04-26 ENCOUNTER — Other Ambulatory Visit: Payer: Self-pay | Admitting: Internal Medicine

## 2013-04-26 NOTE — Telephone Encounter (Signed)
Done hardcopy to robin  

## 2013-04-26 NOTE — Telephone Encounter (Signed)
Faxed hardcopy to [facility] [address] [place]

## 2013-04-28 ENCOUNTER — Other Ambulatory Visit: Payer: Self-pay | Admitting: Internal Medicine

## 2013-04-28 NOTE — Telephone Encounter (Signed)
Called the patient left detailed msg. rx was filled on 04/26/13 faxed to [facility] [address]

## 2013-04-28 NOTE — Telephone Encounter (Signed)
Already done aug 19

## 2013-04-28 NOTE — Telephone Encounter (Signed)
Left msg on traige requesting to speak with nurse concerning her xanax prescription...[name]

## 2013-04-29 ENCOUNTER — Other Ambulatory Visit: Payer: Self-pay | Admitting: Internal Medicine

## 2013-05-11 ENCOUNTER — Encounter (HOSPITAL_COMMUNITY): Payer: Self-pay | Admitting: Emergency Medicine

## 2013-05-11 ENCOUNTER — Emergency Department (HOSPITAL_COMMUNITY): Payer: Managed Care, Other (non HMO)

## 2013-05-11 ENCOUNTER — Emergency Department (HOSPITAL_COMMUNITY)
Admission: EM | Admit: 2013-05-11 | Discharge: 2013-05-11 | Disposition: A | Discharge status: Home / Self Care | Payer: Managed Care, Other (non HMO) | Attending: Emergency Medicine | Admitting: Emergency Medicine

## 2013-05-11 DIAGNOSIS — M79609 Pain in unspecified limb: Secondary | ICD-10-CM | POA: Diagnosis not present

## 2013-05-11 DIAGNOSIS — Z791 Long term (current) use of non-steroidal anti-inflammatories (NSAID): Secondary | ICD-10-CM | POA: Diagnosis not present

## 2013-05-11 DIAGNOSIS — E785 Hyperlipidemia, unspecified: Secondary | ICD-10-CM | POA: Insufficient documentation

## 2013-05-11 DIAGNOSIS — Z8679 Personal history of other diseases of the circulatory system: Secondary | ICD-10-CM | POA: Diagnosis not present

## 2013-05-11 DIAGNOSIS — M79645 Pain in left finger(s): Secondary | ICD-10-CM

## 2013-05-11 DIAGNOSIS — Z79899 Other long term (current) drug therapy: Secondary | ICD-10-CM | POA: Insufficient documentation

## 2013-05-11 DIAGNOSIS — Z8719 Personal history of other diseases of the digestive system: Secondary | ICD-10-CM | POA: Insufficient documentation

## 2013-05-11 DIAGNOSIS — R42 Dizziness and giddiness: Secondary | ICD-10-CM | POA: Insufficient documentation

## 2013-05-11 DIAGNOSIS — E559 Vitamin D deficiency, unspecified: Secondary | ICD-10-CM | POA: Diagnosis not present

## 2013-05-11 DIAGNOSIS — M7989 Other specified soft tissue disorders: Secondary | ICD-10-CM | POA: Diagnosis not present

## 2013-05-11 DIAGNOSIS — Z8669 Personal history of other diseases of the nervous system and sense organs: Secondary | ICD-10-CM | POA: Diagnosis not present

## 2013-05-11 DIAGNOSIS — F411 Generalized anxiety disorder: Secondary | ICD-10-CM | POA: Diagnosis not present

## 2013-05-11 MED ORDER — HYDROCODONE-ACETAMINOPHEN 5-325 MG PO TABS: 1.0000 | ORAL_TABLET | ORAL | Status: DC | PRN

## 2013-05-11 MED ORDER — HYDROCODONE-ACETAMINOPHEN 5-325 MG PO TABS
1.0000 | ORAL_TABLET | Freq: Once | ORAL | Status: AC
  Administered 2013-05-11: 1 via ORAL
  Filled 2013-05-11: qty 1

## 2013-05-11 MED ORDER — IBUPROFEN 800 MG PO TABS: 800.0000 mg | ORAL_TABLET | Freq: Three times a day (TID) | ORAL | Status: DC | PRN

## 2013-05-11 NOTE — ED Provider Notes (Signed)
Medical screening examination/treatment/procedure(s) were performed by non-physician practitioner and as supervising physician I was immediately available for consultation/collaboration.  Ramal Eckhardt T Salome Cozby, MD 05/11/13 2312 

## 2013-05-11 NOTE — ED Notes (Signed)
Pt reports tightness and swelling in l/thumb, increasing over last 18 hrs. Pt stated that she was exposed to spiders last night

## 2013-05-11 NOTE — ED Provider Notes (Signed)
CSN: [id number]     Arrival date & time 05/11/13  1602 History  This chart was scribed for Trixie Dredge, [place], working with Toy Baker, MD by Blanchard Kelch, ED Scribe. This patient was seen in room WTR7/WTR7 and the patient's care was started at 4:57 PM.    Chief Complaint  Patient presents with  . Joint Swelling    swelling and pain in l/thumb    The history is provided by the patient. No language interpreter was used.    HPI Comments: [name] is a 47 y.o. female who presents to the Emergency Department complaining of left thumb pain with associated swelling that radiates up her left arm and worsened yesterday. She describes the pain as shooting and pulsing. She reports a decrease range of motion above the knuckle. Patient complains that she couldn't sleep last night due to the pain. She denies any recent injury to the area but suspects a spider bite. Patient took ibuprofen for pain with mild relief. Patient complains of a lack of appetite. She denies nausea or vomiting. Patient denies any allergies. Denies any trauma to the finger, denies any recent pain nail or nail biting here    Past Medical History  Diagnosis Date  . HYPERLIPIDEMIA 03/27/2010  . ANXIETY 03/27/2010  . COMMON MIGRAINE 03/27/2010  . GERD 03/27/2010  . DISC DISEASE, CERVICAL 03/27/2010  . BACK PAIN, CHRONIC 03/27/2010  . FATIGUE 03/27/2010  . Unspecified vitamin D deficiency 10/23/2010  . Multiple sclerosis 10/23/2010  . Osteoporosis 09/24/2012   Past Surgical History  Procedure Laterality Date  . Abdominal hysterectomy    . S/p knee surgury    . S/p eye surgury    . Neg stress test  2007   Family History  Problem Relation Age of Onset  . Heart disease Father   . Diabetes Other   . Arthritis Other   . Dementia Other   . Cancer Other     2 uncles with lung cancer  . Heart disease Other     2 uncles with heart disease   History  Substance Use Topics  . Smoking status: Never Smoker   . Smokeless  tobacco: Not on file  . Alcohol Use: No   OB History   Grav Para Term Preterm Abortions TAB SAB Ect Mult Living                 Review of Systems  Constitutional: Positive for appetite change.  Gastrointestinal: Negative for nausea and vomiting.  Musculoskeletal: Positive for joint swelling (swelling and pain to left thumb).  Neurological: Positive for dizziness.    Allergies  Duloxetine; Escitalopram oxalate; Statins; and Topamax  Home Medications   Current Outpatient Rx  Name  Route  Sig  Dispense  Refill  . ALPRAZolam (XANAX) 0.5 MG tablet      TAKE 1/2 TABLET BY MOUTH 2 TIMES DAILY AS NEEDED FOR ANXIETY   30 tablet   2   . cholecalciferol (VITAMIN D) 1000 UNITS tablet   Oral   Take 1,000 Units by mouth at bedtime.         . COMBIGAN 0.2-0.5 % ophthalmic solution      INSTILL 2 DROPS INTO BOTH EYES DAILY.   5 mL   4   . hydrochlorothiazide (MICROZIDE) 12.5 MG capsule   Oral   Take 1 capsule (12.5 mg total) by mouth daily. As needed for edema   90 capsule   3   . HYDROcodone-acetaminophen (NORCO/VICODIN)  5-325 MG per tablet   Oral   Take 1 tablet by mouth every 8 (eight) hours as needed for pain.   60 tablet   0   . ibuprofen (ADVIL,MOTRIN) 200 MG tablet   Oral   Take 4 tablets (800 mg total) by mouth every 8 (eight) hours as needed. For pain.   120 tablet   2   . lovastatin (MEVACOR) 10 MG tablet   Oral   Take 1 tablet (10 mg total) by mouth at bedtime.   90 tablet   3   . meloxicam (MOBIC) 7.5 MG tablet   Oral   Take 7.5 mg by mouth daily.          Triage Vitals: BP 106/75  Pulse 71  Temp(Src) 98.4 F (36.9 C) (Oral)  Resp 18  Wt 185 lb (83.915 kg)  BMI 32.25 kg/m2  SpO2 98%  Physical Exam  Nursing note and vitals reviewed. Constitutional: She appears well-developed and well-nourished. No distress.  HENT:  Head: Normocephalic and atraumatic.  Neck: Neck supple.  Pulmonary/Chest: Effort normal.  Musculoskeletal:       Left  wrist: Normal.       Left forearm: Normal.       Left hand: She exhibits decreased range of motion, tenderness and swelling. She exhibits normal capillary refill, no deformity and no laceration.  Left thumb with mild amount of swelling. No erythema, warmth. Patient has pain with active and passive attempt at range of motion of interphalangeal joint. Left thumb is diffusely tender to palpation. No localizable tenderness. No notable skin lesions  Neurological: She is alert.  Skin: She is not diaphoretic.    ED Course  Procedures (including critical care time)  DIAGNOSTIC STUDIES:  Oxygen Saturation is 98% on room air, normal by my interpretation.    COORDINATION OF CARE:  5:03 PM - Patient verbalizes understanding and agrees with treatment plan.  Labs Review Labs Reviewed - No data to display Imaging Review  Dg Finger Thumb Left  05/11/2013   CLINICAL DATA:  47 year old female with left thumb pain and swelling. Possible spider bite.  EXAM: LEFT THUMB 2+V  COMPARISON:  None.  FINDINGS: No subcutaneous gas. No radiopaque foreign body identified. Bone mineralization is within normal limits for age. Joint spaces and alignment preserved. No fracture.  IMPRESSION: Negative.   Electronically Signed   By: Augusto Gamble   On: 05/11/2013 18:06   5:06 PM Discussed patient with Dr Freida Busman who recommends xrays, NSAIDs, f/u outpatient hand surgery.    MDM   1. Thumb pain, left    Patient with mild swelling and pain with flexion of interphalangeal joint of left thumb without any known injury.  Doubt infectious etiology. X-ray negative.  Pt is able to bend IP joint more as pain medication begins to work.  D/C home with pain medication, hand surgery follow up.  Discussed all results,  findings, treatment, follow up  with patient.  Pt given return precautions.  Pt verbalizes understanding and agrees with plan.     I personally performed the services described in this documentation, which was scribed in my  presence. The recorded information has been reviewed and is accurate.    Trixie Dredge, PA-C 05/11/13 1843

## 2013-05-12 DIAGNOSIS — M25549 Pain in joints of unspecified hand: Secondary | ICD-10-CM | POA: Diagnosis not present

## 2013-05-12 DIAGNOSIS — M25569 Pain in unspecified knee: Secondary | ICD-10-CM | POA: Diagnosis not present

## 2013-05-12 DIAGNOSIS — M9420 Chondromalacia, unspecified site: Secondary | ICD-10-CM | POA: Diagnosis not present

## 2013-07-14 ENCOUNTER — Other Ambulatory Visit: Payer: Self-pay

## 2013-08-11 ENCOUNTER — Encounter: Payer: Medicare Other | Admitting: Internal Medicine

## 2013-08-17 ENCOUNTER — Other Ambulatory Visit: Payer: Self-pay | Admitting: Internal Medicine

## 2013-08-18 NOTE — Telephone Encounter (Signed)
Done hardcopy to [facility] - 1 mo only  Pt due for ROV, last was nov 2013  Today or tomorrow prob would work unless she prefers later visit

## 2013-08-18 NOTE — Telephone Encounter (Signed)
[facility]  

## 2013-09-05 DIAGNOSIS — H2700 Aphakia, unspecified eye: Secondary | ICD-10-CM | POA: Diagnosis not present

## 2013-09-05 DIAGNOSIS — H353190 Nonexudative age-related macular degeneration, unspecified eye, stage unspecified: Secondary | ICD-10-CM | POA: Diagnosis not present

## 2013-09-05 DIAGNOSIS — H15849 Scleral ectasia, unspecified eye: Secondary | ICD-10-CM | POA: Diagnosis not present

## 2013-09-05 DIAGNOSIS — H35419 Lattice degeneration of retina, unspecified eye: Secondary | ICD-10-CM | POA: Diagnosis not present

## 2013-09-06 ENCOUNTER — Encounter: Payer: Managed Care, Other (non HMO) | Admitting: Internal Medicine

## 2013-09-06 DIAGNOSIS — Z0289 Encounter for other administrative examinations: Secondary | ICD-10-CM

## 2013-09-06 DIAGNOSIS — Z1231 Encounter for screening mammogram for malignant neoplasm of breast: Secondary | ICD-10-CM | POA: Diagnosis not present

## 2013-09-06 LAB — HM MAMMOGRAPHY

## 2013-09-08 ENCOUNTER — Other Ambulatory Visit: Payer: Self-pay | Admitting: Internal Medicine

## 2013-09-27 ENCOUNTER — Encounter: Payer: Self-pay | Admitting: Internal Medicine

## 2013-12-12 DIAGNOSIS — H109 Unspecified conjunctivitis: Secondary | ICD-10-CM | POA: Diagnosis not present

## 2013-12-14 ENCOUNTER — Ambulatory Visit (INDEPENDENT_AMBULATORY_CARE_PROVIDER_SITE_OTHER): Payer: BC Managed Care – PPO | Admitting: Internal Medicine

## 2013-12-14 ENCOUNTER — Other Ambulatory Visit (INDEPENDENT_AMBULATORY_CARE_PROVIDER_SITE_OTHER): Payer: BC Managed Care – PPO

## 2013-12-14 ENCOUNTER — Encounter: Payer: Self-pay | Admitting: Internal Medicine

## 2013-12-14 VITALS — BP 102/78 | HR 90 | Temp 98.3°F | Ht 64.0 in | Wt 213.0 lb

## 2013-12-14 DIAGNOSIS — IMO0001 Reserved for inherently not codable concepts without codable children: Secondary | ICD-10-CM | POA: Diagnosis not present

## 2013-12-14 DIAGNOSIS — Z0000 Encounter for general adult medical examination without abnormal findings: Secondary | ICD-10-CM

## 2013-12-14 DIAGNOSIS — M791 Myalgia, unspecified site: Secondary | ICD-10-CM | POA: Insufficient documentation

## 2013-12-14 LAB — HEPATIC FUNCTION PANEL
ALT: 20 U/L (ref 0–35)
AST: 19 U/L (ref 0–37)
Albumin: 3.8 g/dL (ref 3.5–5.2)
Alkaline Phosphatase: 64 U/L (ref 39–117)
BILIRUBIN DIRECT: 0 mg/dL (ref 0.0–0.3)
BILIRUBIN TOTAL: 0.6 mg/dL (ref 0.3–1.2)
Total Protein: 7.5 g/dL (ref 6.0–8.3)

## 2013-12-14 LAB — LIPID PANEL
CHOL/HDL RATIO: 5
CHOLESTEROL: 247 mg/dL — AB (ref 0–200)
HDL: 50.5 mg/dL (ref >39.00)
LDL CALC: 184 mg/dL — AB (ref 0–99)
Triglycerides: 62 mg/dL (ref 0.0–149.0)
VLDL: 12.4 mg/dL (ref 0.0–40.0)

## 2013-12-14 LAB — CBC WITH DIFFERENTIAL/PLATELET
BASOS PCT: 1.7 % (ref 0.0–3.0)
Basophils Absolute: 0.1 10*3/uL (ref 0.0–0.1)
EOS PCT: 1.1 % (ref 0.0–5.0)
Eosinophils Absolute: 0.1 10*3/uL (ref 0.0–0.7)
HEMATOCRIT: 38.4 % (ref 36.0–46.0)
Hemoglobin: 13.1 g/dL (ref 12.0–15.0)
LYMPHS ABS: 2.8 10*3/uL (ref 0.7–4.0)
Lymphocytes Relative: 43.9 % (ref 12.0–46.0)
MCHC: 34 g/dL (ref 30.0–36.0)
MCV: 93 fl (ref 78.0–100.0)
MONO ABS: 0.4 10*3/uL (ref 0.1–1.0)
Monocytes Relative: 5.9 % (ref 3.0–12.0)
NEUTROS ABS: 3 10*3/uL (ref 1.4–7.7)
Neutrophils Relative %: 47.4 % (ref 43.0–77.0)
Platelets: 286 10*3/uL (ref 150.0–400.0)
RBC: 4.13 Mil/uL (ref 3.87–5.11)
RDW: 14.4 % (ref 11.5–14.6)
WBC: 6.4 10*3/uL (ref 4.5–10.5)

## 2013-12-14 LAB — BASIC METABOLIC PANEL
BUN: 7 mg/dL (ref 6–23)
CO2: 26 mEq/L (ref 19–32)
CREATININE: 0.5 mg/dL (ref 0.4–1.2)
Calcium: 8.9 mg/dL (ref 8.4–10.5)
Chloride: 105 mEq/L (ref 96–112)
GFR: 173.05 mL/min (ref >60.00)
Glucose, Bld: 89 mg/dL (ref 70–99)
POTASSIUM: 3.8 meq/L (ref 3.5–5.1)
Sodium: 136 mEq/L (ref 135–145)

## 2013-12-14 LAB — SEDIMENTATION RATE: Sed Rate: 32 mm/hr — ABNORMAL HIGH (ref 0–22)

## 2013-12-14 LAB — TSH: TSH: 0.71 u[IU]/mL (ref 0.35–5.50)

## 2013-12-14 LAB — CK: Total CK: 75 U/L (ref 7–177)

## 2013-12-14 MED ORDER — ALPRAZOLAM 0.5 MG PO TABS: ORAL_TABLET | ORAL | Status: DC

## 2013-12-14 NOTE — Progress Notes (Signed)
Pre visit review using our clinic review tool, if applicable. No additional management support is needed unless otherwise documented below in the visit note. 

## 2013-12-14 NOTE — Assessment & Plan Note (Signed)
For esr and cpk

## 2013-12-14 NOTE — Progress Notes (Signed)
Subjective:    Patient ID: KA BENCH, female    DOB: 24-Jul-1966, 48 y.o.   MRN: [id number]  HPI Here for wellness and f/u;  Overall doing ok;  Pt denies CP, worsening SOB, DOE, wheezing, orthopnea, PND, worsening LE edema, palpitations, dizziness or syncope.  Pt denies neurological change such as new headache, facial or extremity weakness.  Pt denies polydipsia, polyuria, or low sugar symptoms. Pt states overall good compliance with treatment and medications, good tolerability, and has been trying to follow lower cholesterol diet.  Pt denies worsening depressive symptoms, suicidal ideation or panic. No fever, night sweats, wt loss, loss of appetite, or other constitutional symptoms.  Pt states good ability with ADL's, has low fall risk, home safety reviewed and adequate, no other significant changes in hearing or vision, and only occasionally active with exercise.Mother with recent dx breast cancer.  Trying to work  On lower chol diet. Denies worsening depressive symptoms, suicidal ideation, or panic; has ongoing anxiety, asks for med refills. Has also an unusual 6 mo of self described flu like illness with myalgias, head congestion the overall aching and feeling bad that is defferent from her usual allergies controlled with allegra Past Medical History  Diagnosis Date  . HYPERLIPIDEMIA 03/27/2010  . ANXIETY 03/27/2010  . COMMON MIGRAINE 03/27/2010  . GERD 03/27/2010  . [place] DISEASE, CERVICAL 03/27/2010  . BACK PAIN, CHRONIC 03/27/2010  . FATIGUE 03/27/2010  . Unspecified vitamin D deficiency 10/23/2010  . Multiple sclerosis 10/23/2010  . Osteoporosis 09/24/2012   Past Surgical History  Procedure Laterality Date  . Abdominal hysterectomy    . S/p knee surgury    . S/p eye surgury    . Neg stress test  2007    reports that she has never smoked. She does not have any smokeless tobacco history on file. She reports that she does not drink alcohol or use illicit drugs. family history includes  Arthritis in her other; Cancer in her other; Dementia in her other; Diabetes in her other; Heart disease in her father and other. Allergies  Allergen Reactions  . Duloxetine     REACTION: GI distress  . Escitalopram Oxalate     jitteriness  . Lipitor [Atorvastatin] Other (See Comments)    myalgia  . Lovastatin     myalgias  . Simvastatin     Wt gain  . Statins   . Topamax     dizziness   Current Outpatient Prescriptions on File Prior to Visit  Medication Sig Dispense Refill  . ALPRAZolam (XANAX) 0.5 MG tablet TAKE 1/2 TABLET TWICE DAILY AS NEEDED FOR ANXIETY  30 tablet  0  . COMBIGAN 0.2-0.5 % ophthalmic solution INSTILL 2 DROPS INTO BOTH EYES DAILY.  5 mL  4  . hydrochlorothiazide (MICROZIDE) 12.5 MG capsule Take 1 capsule (12.5 mg total) by mouth daily. As needed for edema  90 capsule  3   No current facility-administered medications on file prior to visit.     Review of Systems Constitutional: Negative for diaphoresis, activity change, appetite change or unexpected weight change.  HENT: Negative for hearing loss, ear pain, facial swelling, mouth sores and neck stiffness.   Eyes: Negative for pain, redness and visual disturbance.  Respiratory: Negative for shortness of breath and wheezing.   Cardiovascular: Negative for chest pain and palpitations.  Gastrointestinal: Negative for diarrhea, blood in stool, abdominal distention or other pain Genitourinary: Negative for hematuria, flank pain or change in urine volume.  Musculoskeletal: Negative for myalgias  and joint swelling.  Skin: Negative for color change and wound.  Neurological: Negative for syncope and numbness. other than noted Hematological: Negative for adenopathy.  Psychiatric/Behavioral: Negative for hallucinations, self-injury, decreased concentration and agitation.      Objective:   Physical Exam BP 102/78  Pulse 90  Temp(Src) 98.3 F (36.8 C) (Oral)  Ht 5\' 4"  (1.626 m)  Wt 213 lb (96.616 kg)  BMI 36.54  kg/m2  SpO2 98% VS noted,  Constitutional: Pt is oriented to person, place, and time. Appears well-developed and well-nourished.  Head: Normocephalic and atraumatic.  Right Ear: External ear normal.  Left Ear: External ear normal.  Nose: Nose normal.  Mouth/Throat: Oropharynx is clear and moist.  Eyes: Conjunctivae and EOM are normal. Pupils are equal, round, and reactive to light.  Neck: Normal range of motion. Neck supple. No JVD present. No tracheal deviation present.  Cardiovascular: Normal rate, regular rhythm, normal heart sounds and intact distal pulses.   Pulmonary/Chest: Effort normal and breath sounds normal.  Abdominal: Soft. Bowel sounds are normal. There is no tenderness. No HSM  Musculoskeletal: Normal range of motion. Exhibits no edema.  Lymphadenopathy:  Has no cervical adenopathy.  Neurological: Pt is alert and oriented to person, place, and time. Pt has normal reflexes. No cranial nerve deficit.  Skin: Skin is warm and dry. No rash noted.  Psychiatric:  Has  normal mood and affect. Behavior is normal. 1-2+ nervous    Assessment & Plan:

## 2013-12-14 NOTE — Patient Instructions (Signed)

## 2013-12-14 NOTE — Assessment & Plan Note (Addendum)

## 2013-12-20 ENCOUNTER — Other Ambulatory Visit (HOSPITAL_COMMUNITY): Payer: Self-pay | Admitting: Cardiology

## 2013-12-20 ENCOUNTER — Telehealth: Payer: Self-pay

## 2013-12-20 DIAGNOSIS — Z0000 Encounter for general adult medical examination without abnormal findings: Secondary | ICD-10-CM

## 2013-12-20 DIAGNOSIS — R0989 Other specified symptoms and signs involving the circulatory and respiratory systems: Secondary | ICD-10-CM | POA: Diagnosis not present

## 2013-12-20 DIAGNOSIS — R079 Chest pain, unspecified: Secondary | ICD-10-CM

## 2013-12-20 DIAGNOSIS — K219 Gastro-esophageal reflux disease without esophagitis: Secondary | ICD-10-CM | POA: Diagnosis not present

## 2013-12-20 DIAGNOSIS — R0609 Other forms of dyspnea: Secondary | ICD-10-CM | POA: Diagnosis not present

## 2013-12-20 DIAGNOSIS — E7800 Pure hypercholesterolemia, unspecified: Secondary | ICD-10-CM | POA: Diagnosis not present

## 2013-12-20 NOTE — Telephone Encounter (Signed)
UA ordered

## 2013-12-20 NOTE — Telephone Encounter (Signed)
Lab entered

## 2013-12-21 ENCOUNTER — Other Ambulatory Visit (INDEPENDENT_AMBULATORY_CARE_PROVIDER_SITE_OTHER): Payer: BC Managed Care – PPO

## 2013-12-21 DIAGNOSIS — Z0000 Encounter for general adult medical examination without abnormal findings: Secondary | ICD-10-CM | POA: Diagnosis not present

## 2013-12-21 LAB — URINALYSIS, ROUTINE W REFLEX MICROSCOPIC
Bilirubin Urine: NEGATIVE
Hgb urine dipstick: NEGATIVE
Ketones, ur: NEGATIVE
Nitrite: NEGATIVE
PH: 7 (ref 5.0–8.0)
SPECIFIC GRAVITY, URINE: 1.01 (ref 1.000–1.030)
TOTAL PROTEIN, URINE-UPE24: NEGATIVE
URINE GLUCOSE: NEGATIVE
UROBILINOGEN UA: 0.2 (ref 0.0–1.0)

## 2013-12-23 ENCOUNTER — Other Ambulatory Visit: Payer: Self-pay

## 2013-12-23 ENCOUNTER — Encounter (HOSPITAL_COMMUNITY)
Admission: RE | Admit: 2013-12-23 | Discharge: 2013-12-23 | Disposition: A | Discharge status: Home / Self Care | Payer: BC Managed Care – PPO | Source: Ambulatory Visit | Attending: Cardiology | Admitting: Cardiology

## 2013-12-23 ENCOUNTER — Ambulatory Visit (HOSPITAL_COMMUNITY)
Admission: RE | Admit: 2013-12-23 | Discharge: 2013-12-23 | Disposition: A | Discharge status: Home / Self Care | Payer: BC Managed Care – PPO | Source: Ambulatory Visit | Attending: Cardiology | Admitting: Cardiology

## 2013-12-23 ENCOUNTER — Encounter (HOSPITAL_COMMUNITY): Admission: RE | Admit: 2013-12-23 | Payer: BC Managed Care – PPO | Source: Ambulatory Visit

## 2013-12-23 DIAGNOSIS — R079 Chest pain, unspecified: Secondary | ICD-10-CM

## 2013-12-23 DIAGNOSIS — R0989 Other specified symptoms and signs involving the circulatory and respiratory systems: Secondary | ICD-10-CM | POA: Diagnosis not present

## 2013-12-23 DIAGNOSIS — I219 Acute myocardial infarction, unspecified: Secondary | ICD-10-CM | POA: Diagnosis not present

## 2013-12-23 DIAGNOSIS — R0609 Other forms of dyspnea: Secondary | ICD-10-CM | POA: Diagnosis not present

## 2013-12-23 MED ORDER — TECHNETIUM TC 99M SESTAMIBI GENERIC - CARDIOLITE
30.0000 | Freq: Once | INTRAVENOUS | Status: AC | PRN
  Administered 2013-12-23: 30 via INTRAVENOUS

## 2013-12-23 MED ORDER — TECHNETIUM TC 99M SESTAMIBI GENERIC - CARDIOLITE
10.0000 | Freq: Once | INTRAVENOUS | Status: AC | PRN
  Administered 2013-12-23: 10 via INTRAVENOUS

## 2013-12-23 MED ORDER — REGADENOSON 0.4 MG/5ML IV SOLN
INTRAVENOUS | Status: AC
  Filled 2013-12-23: qty 5

## 2013-12-23 MED ORDER — REGADENOSON 0.4 MG/5ML IV SOLN
0.4000 mg | Freq: Once | INTRAVENOUS | Status: AC
  Administered 2013-12-23: 0.4 mg via INTRAVENOUS

## 2014-01-09 ENCOUNTER — Encounter: Payer: Self-pay | Admitting: Internal Medicine

## 2014-02-20 DIAGNOSIS — M159 Polyosteoarthritis, unspecified: Secondary | ICD-10-CM | POA: Diagnosis not present

## 2014-02-20 DIAGNOSIS — K219 Gastro-esophageal reflux disease without esophagitis: Secondary | ICD-10-CM | POA: Diagnosis not present

## 2014-02-20 DIAGNOSIS — I2510 Atherosclerotic heart disease of native coronary artery without angina pectoris: Secondary | ICD-10-CM | POA: Diagnosis not present

## 2014-02-20 DIAGNOSIS — E785 Hyperlipidemia, unspecified: Secondary | ICD-10-CM | POA: Diagnosis not present

## 2014-03-02 DIAGNOSIS — Z0000 Encounter for general adult medical examination without abnormal findings: Secondary | ICD-10-CM | POA: Diagnosis not present

## 2014-03-02 DIAGNOSIS — Z01419 Encounter for gynecological examination (general) (routine) without abnormal findings: Secondary | ICD-10-CM | POA: Diagnosis not present

## 2014-03-28 ENCOUNTER — Ambulatory Visit (INDEPENDENT_AMBULATORY_CARE_PROVIDER_SITE_OTHER): Payer: BC Managed Care – PPO | Admitting: Internal Medicine

## 2014-03-28 ENCOUNTER — Encounter: Payer: Self-pay | Admitting: Internal Medicine

## 2014-03-28 VITALS — BP 116/66 | HR 74 | Ht 63.0 in | Wt 207.5 lb

## 2014-03-28 DIAGNOSIS — R072 Precordial pain: Secondary | ICD-10-CM | POA: Diagnosis not present

## 2014-03-28 DIAGNOSIS — R002 Palpitations: Secondary | ICD-10-CM

## 2014-03-28 DIAGNOSIS — Z01818 Encounter for other preprocedural examination: Secondary | ICD-10-CM | POA: Diagnosis not present

## 2014-03-28 DIAGNOSIS — D689 Coagulation defect, unspecified: Secondary | ICD-10-CM | POA: Diagnosis not present

## 2014-03-28 DIAGNOSIS — G35 Multiple sclerosis: Secondary | ICD-10-CM

## 2014-03-28 DIAGNOSIS — R5383 Other fatigue: Secondary | ICD-10-CM

## 2014-03-28 DIAGNOSIS — E785 Hyperlipidemia, unspecified: Secondary | ICD-10-CM | POA: Diagnosis not present

## 2014-03-28 DIAGNOSIS — R0789 Other chest pain: Secondary | ICD-10-CM | POA: Diagnosis not present

## 2014-03-28 DIAGNOSIS — R079 Chest pain, unspecified: Secondary | ICD-10-CM | POA: Insufficient documentation

## 2014-03-28 DIAGNOSIS — R9439 Abnormal result of other cardiovascular function study: Secondary | ICD-10-CM | POA: Insufficient documentation

## 2014-03-28 DIAGNOSIS — R5381 Other malaise: Secondary | ICD-10-CM | POA: Diagnosis not present

## 2014-03-28 LAB — CBC
HCT: 38.9 % (ref 36.0–46.0)
Hemoglobin: 13.6 g/dL (ref 12.0–15.0)
MCH: 31.4 pg (ref 26.0–34.0)
MCHC: 35 g/dL (ref 30.0–36.0)
MCV: 89.8 fL (ref 78.0–100.0)
PLATELETS: 353 10*3/uL (ref 150–400)
RBC: 4.33 MIL/uL (ref 3.87–5.11)
RDW: 13.6 % (ref 11.5–15.5)
WBC: 6.1 10*3/uL (ref 4.0–10.5)

## 2014-03-28 NOTE — Patient Instructions (Signed)
Your physician has requested that you have a cardiac catheterization. Cardiac catheterization is used to diagnose and/or treat various heart conditions. Doctors may recommend this procedure for a number of different reasons. The most common reason is to evaluate chest pain. Chest pain can be a symptom of coronary artery disease (CAD), and cardiac catheterization can show whether plaque is narrowing or blocking your heart's arteries. This procedure is also used to evaluate the valves, as well as measure the blood flow and oxygen levels in different parts of your heart. For further information please visit [email or web address]. Please follow instruction sheet, as given.  Please schedule this with Dr. Debara Pickett - possible Friday July 24th.  You will need to have blood work 3-5 days prior to this procedure.  Please go to [address] 1st floor of same building as Dr. Lysbeth Penner office. You do not need an appointment

## 2014-03-28 NOTE — Progress Notes (Signed)
OFFICE NOTE  Chief Complaint:  Chest pain, fatigue, palpitations, second opinion  Primary Care Physician: Tammy Cower, MD  HPI:  [name] is a pleasant 48 year old female who has been suffering with symptoms of chest pain and palpitations for quite some time. Apparently in 2008 she had an episode of chest pain and was admitted to the hospital. She was treated and ruled out and continued to have very intermittent episodes of discomfort over the years. Recently she had some worsening symptoms including chest discomfort and feeling of palpitations. This is followed by significant fatigue and lack of energy for several days after the palpitations have resolved. It is associated with heaviness in the chest that is behind the sternum. It does not radiate to the arm or to the jaw. Sometimes the symptoms are worse when she is exerting herself and improve at rest. Other times they occur at rest. She had recently seen Dr. Terrence Dupont, for a cardiac opinion. He ordered a stress test which was performed at [facility]. 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 No evidence for inducible ischemia. Normal function with an EF of 66%".  This was interpreted by a Development worker, community.  Apparently she received a call saying her stress test was negative and she was told to followup with him in the office in 3 weeks. When she came back she was allegedly told that her stress test showed a prior heart attack. There does however appear to be conflicting data. Based on this she is seeking another opinion, especially in light of recurrent symptoms. She does report that the palpitation episodes are actually fairly infrequent. She says she's had about 3 episodes of this year and perhaps 2 or 3 episodes last year.  PMHx:  Past Medical History  Diagnosis Date  . HYPERLIPIDEMIA  03/27/2010  . ANXIETY 03/27/2010  . COMMON MIGRAINE 03/27/2010  . GERD 03/27/2010  . [place] DISEASE, CERVICAL 03/27/2010  . BACK PAIN, CHRONIC 03/27/2010  . FATIGUE 03/27/2010  . Unspecified vitamin D deficiency 10/23/2010  . Multiple sclerosis 10/23/2010  . Osteoporosis 09/24/2012    Past Surgical History  Procedure Laterality Date  . Abdominal hysterectomy    . S/p knee surgury    . S/p eye surgury    . Neg stress test  2007    FAMHx:  Family History  Problem Relation Age of Onset  . Heart disease Father   . Diabetes Other   . Arthritis Other   . Dementia Other   . Cancer Other     2 uncles with lung cancer  . Heart disease Other     2 uncles with heart disease    SOCHx:   reports that she has never smoked. She does not have any smokeless tobacco history on file. She reports that she does not drink alcohol or use illicit drugs.  ALLERGIES:  Allergies  Allergen Reactions  . Lipitor [Atorvastatin] Other (See Comments)    myalgia  . Lovastatin     myalgias  . Simvastatin     Wt gain  . Statins   . Topamax     dizziness    ROS: A comprehensive review of systems was negative except for: Constitutional: positive for fatigue Respiratory: positive for dyspnea on exertion Cardiovascular: positive for chest pain and palpitations Behavioral/Psych: positive for anxiety  HOME MEDS: Current Outpatient Prescriptions  Medication Sig Dispense Refill  . ALPRAZolam (XANAX) 0.5 MG tablet TAKE 1/2 TABLET TWICE DAILY AS NEEDED FOR ANXIETY  30 tablet  5  . COMBIGAN 0.2-0.5 % ophthalmic solution INSTILL 2 DROPS INTO BOTH EYES DAILY.  5 mL  4  . hydrochlorothiazide (MICROZIDE) 12.5 MG capsule Take 1 capsule (12.5 mg total) by mouth daily. As needed for edema  90 capsule  3  . NITROSTAT 0.4 MG SL tablet Place 1 tablet under the tongue every 15 (fifteen) minutes as needed.      . simvastatin (ZOCOR) 40 MG tablet Take 40 mg by mouth daily.       No current facility-administered medications  for this visit.    LABS/IMAGING: No results found for this or any previous visit (from the past 48 hour(s)). No results found.  VITALS: BP 116/66  Pulse 74  Ht 5\' 3"  (1.6 m)  Wt 207 lb 8 oz (94.121 kg)  BMI 36.77 kg/m2  EXAM: General appearance: alert and no distress Neck: no carotid bruit, no JVD and thyroid not enlarged, symmetric, no tenderness/mass/nodules Lungs: clear to auscultation bilaterally Heart: regular rate and rhythm, S1, S2 normal, no murmur, click, rub or gallop Abdomen: soft, non-tender; bowel sounds normal; no masses,  no organomegaly Extremities: extremities normal, atraumatic, no cyanosis or edema Pulses: 2+ and symmetric Skin: Skin color, texture, turgor normal. No rashes or lesions Neurologic: Grossly normal Psych: Mood, affect normal  EKG: Normal sinus rhythm at 74  ASSESSMENT: 1. Persistent chest pain and palpitations 2. Abnormal nuclear stress test with conflicting data (fixed defect concerning for scar, however normal wall motion and EF) 3. Possible history of multiple sclerosis 4. Dyslipidemia  PLAN: 1.   [name] continues to have episodes of chest pain and he infrequent episodes of palpitations associated with fatigue and weakness for several days until it eventually resolves. These may be related issues were separate. The chest discomfort and some associated shortness of breath these seem to be more related with exertion and relieved by rest. There is a strong family history of heart disease. Her stress test is somewhat equivocal. There is a fixed defect which was interpreted as scar because of an associated wall motion abnormality, however EF was also reported to be normal with no wall motion abnormalities. The location in the distal apical and apical lateral walls are typical places for breast attenuation artifact which may be an explanation as to by the EF and wall motion was generally normal, or it is possible she had a prior infarct. Given her  ongoing symptoms, a definitive cardiac catheterization would be recommended to evaluate her coronaries and ultimately determine whether or not she had had a prior infarct. She is agreeable to this approach. We did discuss risks and benefits of cardiac catheterization today in the office and she wishes to proceed. I do think she is a good radial catheterization candidate. Finally, with her infrequent palpitations, we may need to consider an implantable loop recorder. I would like to delay this until after her heart catheterization. This may be helpful in order to try to pick up what is happening during veryt infrequent events which may only happen several times a year.  Pixie Casino, MD, [facility] Attending Cardiologist [facility]  HILTY,Kenneth C 03/28/2014, 5:04 PM

## 2014-03-29 ENCOUNTER — Telehealth: Payer: Self-pay | Admitting: Internal Medicine

## 2014-03-29 ENCOUNTER — Encounter (HOSPITAL_COMMUNITY): Payer: Self-pay | Admitting: Pharmacy Technician

## 2014-03-29 LAB — APTT: APTT: 28 s (ref 24–37)

## 2014-03-29 LAB — BASIC METABOLIC PANEL
BUN: 8 mg/dL (ref 6–23)
CALCIUM: 9.4 mg/dL (ref 8.4–10.5)
CO2: 26 mEq/L (ref 19–32)
Chloride: 101 mEq/L (ref 96–112)
Creat: 0.67 mg/dL (ref 0.50–1.10)
GLUCOSE: 83 mg/dL (ref 70–99)
Potassium: 4.3 mEq/L (ref 3.5–5.3)
SODIUM: 134 meq/L — AB (ref 135–145)

## 2014-03-29 LAB — TSH: TSH: 1.032 u[IU]/mL (ref 0.350–4.500)

## 2014-03-29 LAB — PROTIME-INR
INR: 0.96 (ref <1.50)
PROTHROMBIN TIME: 12.8 s (ref 11.6–15.2)

## 2014-03-29 NOTE — Telephone Encounter (Signed)
Patient notified she does not need a CXR pre-cath per [place].  Patient voiced understanding.

## 2014-03-29 NOTE — Telephone Encounter (Signed)
Calling because she needs a order faxed over to [facility] for her to have the chest xray .Marland Kitchen Thanks

## 2014-03-31 ENCOUNTER — Ambulatory Visit (HOSPITAL_COMMUNITY)
Admission: RE | Admit: 2014-03-31 | Discharge: 2014-03-31 | Disposition: A | Discharge status: Home / Self Care | Payer: BC Managed Care – PPO | Source: Ambulatory Visit | Attending: Internal Medicine | Admitting: Internal Medicine

## 2014-03-31 ENCOUNTER — Encounter (HOSPITAL_COMMUNITY): Payer: Self-pay | Admitting: Internal Medicine

## 2014-03-31 ENCOUNTER — Encounter (HOSPITAL_COMMUNITY)
Admission: RE | Disposition: A | Discharge status: Home / Self Care | Payer: Self-pay | Source: Ambulatory Visit | Attending: Internal Medicine

## 2014-03-31 DIAGNOSIS — R002 Palpitations: Secondary | ICD-10-CM | POA: Insufficient documentation

## 2014-03-31 DIAGNOSIS — Z01818 Encounter for other preprocedural examination: Secondary | ICD-10-CM

## 2014-03-31 DIAGNOSIS — R0789 Other chest pain: Secondary | ICD-10-CM

## 2014-03-31 DIAGNOSIS — R5383 Other fatigue: Secondary | ICD-10-CM

## 2014-03-31 DIAGNOSIS — R5381 Other malaise: Secondary | ICD-10-CM | POA: Insufficient documentation

## 2014-03-31 DIAGNOSIS — R079 Chest pain, unspecified: Secondary | ICD-10-CM | POA: Diagnosis not present

## 2014-03-31 DIAGNOSIS — R9439 Abnormal result of other cardiovascular function study: Secondary | ICD-10-CM | POA: Diagnosis not present

## 2014-03-31 HISTORY — PX: LEFT HEART CATHETERIZATION WITH CORONARY ANGIOGRAM: SHX5451

## 2014-03-31 SURGERY — LEFT HEART CATHETERIZATION WITH CORONARY ANGIOGRAM
Anesthesia: LOCAL

## 2014-03-31 MED ORDER — HEPARIN SODIUM (PORCINE) 1000 UNIT/ML IJ SOLN
INTRAMUSCULAR | Status: AC
  Filled 2014-03-31: qty 1

## 2014-03-31 MED ORDER — FENTANYL CITRATE 0.05 MG/ML IJ SOLN
INTRAMUSCULAR | Status: AC
  Filled 2014-03-31: qty 2

## 2014-03-31 MED ORDER — VERAPAMIL HCL 2.5 MG/ML IV SOLN
INTRAVENOUS | Status: AC
  Filled 2014-03-31: qty 2

## 2014-03-31 MED ORDER — SODIUM CHLORIDE 0.9 % IV SOLN
INTRAVENOUS | Status: DC
  Administered 2014-03-31: 09:00:00 via INTRAVENOUS

## 2014-03-31 MED ORDER — SODIUM CHLORIDE 0.9 % IJ SOLN: 3.0000 mL | INTRAMUSCULAR | Status: DC | PRN

## 2014-03-31 MED ORDER — ASPIRIN 81 MG PO CHEW
81.0000 mg | CHEWABLE_TABLET | ORAL | Status: AC
  Administered 2014-03-31: 81 mg via ORAL

## 2014-03-31 MED ORDER — ASPIRIN 81 MG PO CHEW
CHEWABLE_TABLET | ORAL | Status: AC
  Administered 2014-03-31: 81 mg via ORAL
  Filled 2014-03-31: qty 1

## 2014-03-31 MED ORDER — LIDOCAINE HCL (PF) 1 % IJ SOLN
INTRAMUSCULAR | Status: AC
  Filled 2014-03-31: qty 30

## 2014-03-31 MED ORDER — MIDAZOLAM HCL 2 MG/2ML IJ SOLN
INTRAMUSCULAR | Status: AC
  Filled 2014-03-31: qty 2

## 2014-03-31 MED ORDER — HEPARIN (PORCINE) IN NACL 2-0.9 UNIT/ML-% IJ SOLN
INTRAMUSCULAR | Status: AC
  Filled 2014-03-31: qty 1000

## 2014-03-31 MED ORDER — NITROGLYCERIN 1 MG/10 ML FOR IR/CATH LAB
INTRA_ARTERIAL | Status: AC
Start: 2014-03-31 — End: 2014-03-31
  Filled 2014-03-31: qty 10

## 2014-03-31 NOTE — H&P (Signed)
     INTERVAL PROCEDURE H&P  History and Physical Interval Note:  03/31/2014 8:37 AM  [name] has presented today for their planned procedure. The various methods of treatment have been discussed with the patient and family. After consideration of risks, benefits and other options for treatment, the patient has consented to the procedure.  The patients' outpatient history has been reviewed, patient examined, and no change in status from most recent office note within the past 30 days. I have reviewed the patients' chart and labs and will proceed as planned. Questions were answered to the patient's satisfaction.   Cath Lab Visit (complete for each Cath Lab visit)  Clinical Evaluation Leading to the Procedure:   ACS: No.  Non-ACS:    Anginal Classification: CCS III  Anti-ischemic medical therapy: Minimal Therapy (1 class of medications)  Non-Invasive Test Results: Low-risk stress test findings: cardiac mortality <1%/year  Prior CABG: No previous CABG   Tammy Casino, MD, [facility] Attending Cardiologist [facility]  [name] 03/31/2014, 8:37 AM

## 2014-03-31 NOTE — CV Procedure (Signed)
CARDIAC CATHETERIZATION REPORT  [name]   [id number] 1966/02/10  Performing Cardiologist: Pixie Casino Primary Physician: Cathlean Cower, MD Primary Cardiologist: [facility]  Procedures Performed:  Left Heart Catheterization via 5 Fr right radial artery access  Left Ventriculography, (RAO/LAO) hand injection <10 ml total contrast  Native Coronary Angiography  Indication(s): chest pain  Pre-Procedural Diagnosis(es):  1. Chest pain 2. Abnormal nuclear stress test suggesting prior infarct, with preserved LV function  Post-Procedural Diagnosis(es): 1. Normal coronary arteries 2. Normal LV function with normal wall motion  Pre-Procedural Non-invasive testing: Abnormal -- see above.  History: 48 y.o. female who has been suffering with symptoms of chest pain and palpitations for quite some time. Apparently in 2008 she had an episode of chest pain and was admitted to the hospital. She was treated and ruled out and continued to have very intermittent episodes of discomfort over the years. Recently she had some worsening symptoms including chest discomfort and feeling of palpitations. This is followed by significant fatigue and lack of energy for several days after the palpitations have resolved. It is associated with heaviness in the chest that is behind the sternum. It does not radiate to the arm or to the jaw. Sometimes the symptoms are worse when she is exerting herself and improve at rest. Other times they occur at rest. She had recently seen Dr. Terrence Dupont, for a cardiac opinion. He ordered a stress test which was performed at [facility]. 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 No evidence for inducible ischemia. Normal function with an EF of 66%". This was interpreted by a Development worker, community. Apparently she received a call saying her  stress test was negative and she was told to followup with him in the office in 3 weeks. When she came back she was allegedly told that her stress test showed a prior heart attack. There does however appear to be conflicting data. Based on this she is seeking another opinion, especially in light of recurrent symptoms. She does report that the palpitation episodes are actually fairly infrequent. She says she's had about 3 episodes of this year and perhaps 2 or 3 episodes last year.  Risks / Complications include, but not limited to: Death, MI, CVA/TIA, VF/VT (with defibrillation), Bradycardia (need for temporary pacer placement), contrast induced nephropathy, bleeding / bruising / hematoma / pseudoaneurysm, vascular or coronary injury (with possible emergent CT or Vascular Surgery), adverse medication reactions, infection.    Consent: Risks of procedure as well as the alternatives and risks of each were explained to the (patient/caregiver).  Consent for procedure obtained.  Procedure: The patient was brought to the 2nd [facility] Lab in the fasting state and prepped and draped in the usual sterile fashion for (Right radial) access. A modified Allen's test with plethysmography was performed on the right wrist demonstrating adequate Ulnar Artery collateral flow.    Time Out: Verified patient identification, verified procedure, site/side was marked, verified correct patient position, special equipment/implants available, radiation safety measures in place (including badges and shielding), medications/allergies/relevent history reviewed, required imaging and test results available.  Performed  Procedure: The right wrist was anesthetized with 1% subcutaneous Lidocaine.  The right radial artery was accessed using the Seldinger Technique with placement of a 6 Fr Glide Sheath. The sheath was aspirated and flushed.  Then a total of 10 ml of standard  Radial Artery Cocktail (see  medications) was infused.  A 5 Fr TIG 4.0 Catheter was advanced of over a Safety J wire into the ascending Aorta.  The catheter was used to engage the left and right coronary artery.  Multiple cineangiographic views of the left and right coronary artery system(s) were performed. This catheter was then exchanged over the [employer] J wire for a 84F JR4 catheter that was advanced across the Aortic Valve.  LV hemodynamics were measured (and) Left Ventriculography was performed by hand injection.  LV hemodynamics were then re-sampled, and the catheter was pulled back across the Aortic Valve for measurement of "pull-back" gradient.  The catheter and the wire were removed completely out of the body.  The sheath was removed in the Cath Lab with a TR band placed at 12 ml Air at 12:45 (time).  Reverse Allen's test did  reveal non-occlusive hemostasis.  Recovery: The patient was transported to the cath lab holding area in stable condition.   The patient  was stable before, during and following the procedure.   Patient did tolerate procedure well. There were not complications.  EBL: Minimal  Medications:  Premedication: none  Sedation:  6 mg IV Versed, 75 mcg IV Fentanyl  Contrast:  60 ml Omnipaque  Local Anesthesia: 2 cc 1% lidocaine  4000 IU of IV heparin  10 cc radial cocktail  Hemodynamics:  Central Aortic Pressure / Mean Aortic Pressure: 104/64  LV Pressure / LV End diastolic Pressure:  8  Left Ventriculography:  EF:  60-65%  Wall Motion: Normal, vigourous  MR: None  Coronary Angiographic Data:  Left Main:  Normal - usual bifurcation into the LAD and LCX systems  Left Anterior Descending (LAD):  Large caliber vessel, angiographically normal, coarses around the apex  Circumflex (LCx):  Large vessel which gives off a large D1 and smaller D2  1st obtuse marginal:  Normal 2nd obtuse marginal:  Normal  Right Coronary Artery: Dominant, moderate sized vessel  right ventricle  branch of right coronary artery: Normal  posterior descending artery: Normal  posterior lateral branch:  normal  Impression: 1.  Angiographically normal coronaries 2.  LVEF 60-65%, normal to hyperdynamic wall motion 3.  LVEDP = 8 mmHg  Plan: 1.  No further cardiac work-up is necessary. 2.  False positive nuclear stress test 3.  Would recommend patient monitor her palpitations and if they are coming with more frequency, we could consider monitoring at some point. 4.  Follow-up in the office for a radial site check and then with me prn.  The case and results was discussed with the patient and family if available.  The case and results was not discussed with the patient's PCP. The case and results was discussed with the patient's Cardiologist.  Time Spent Directly with the Patient:  58 minutes  Pixie Casino, MD, [facility] Attending Cardiologist [facility]  HILTY,Kenneth C 03/31/2014, 1:40 PM

## 2014-03-31 NOTE — Discharge Instructions (Signed)
Radial Site Care °Refer to this sheet in the next few weeks. These instructions provide you with information on caring for yourself after your procedure. Your caregiver may also give you more specific instructions. Your treatment has been planned according to current medical practices, but problems sometimes occur. Call your caregiver if you have any problems or questions after your procedure. °HOME CARE INSTRUCTIONS °· You may shower the day after the procedure. Remove the bandage (dressing) and gently wash the site with plain soap and water. Gently pat the site dry. °· Do not apply powder or lotion to the site. °· Do not submerge the affected site in water for 3 to 5 days. °· Inspect the site at least twice daily. °· Do not flex or bend the affected arm for 24 hours. °· No lifting over 5 pounds (2.3 kg) for 5 days after your procedure. °· Do not drive home if you are discharged the same day of the procedure. Have someone else drive you. °· You may drive 24 hours after the procedure unless otherwise instructed by your caregiver. °· Do not operate machinery or power tools for 24 hours. °· A responsible adult should be with you for the first 24 hours after you arrive home. °What to expect: °· Any bruising will usually fade within 1 to 2 weeks. °· Blood that collects in the tissue (hematoma) may be painful to the touch. It should usually decrease in size and tenderness within 1 to 2 weeks. °SEEK IMMEDIATE MEDICAL CARE IF: °· You have unusual pain at the radial site. °· You have redness, warmth, swelling, or pain at the radial site. °· You have drainage (other than a small amount of blood on the dressing). °· You have chills. °· You have a fever or persistent symptoms for more than 72 hours. °· You have a fever and your symptoms suddenly get worse. °· Your arm becomes pale, cool, tingly, or numb. °· You have heavy bleeding from the site. Hold pressure on the site. °Document Released: 09/27/2010 Document Revised:  11/17/2011 Document Reviewed: 09/27/2010 °ExitCare® Patient Information ©2015 [employer]. This information is not intended to replace advice given to you by your health care provider. Make sure you discuss any questions you have with your health care provider. ° °

## 2014-03-31 NOTE — Progress Notes (Signed)
Assumed care of pt. Report received from Anette Guarneri, Therapist, sports. Assessment documented.

## 2014-04-12 ENCOUNTER — Encounter: Payer: Self-pay | Admitting: Cardiology

## 2014-04-12 ENCOUNTER — Ambulatory Visit (INDEPENDENT_AMBULATORY_CARE_PROVIDER_SITE_OTHER): Payer: BC Managed Care – PPO | Admitting: Cardiology

## 2014-04-12 VITALS — BP 114/70 | HR 66 | Ht 63.5 in | Wt 207.0 lb

## 2014-04-12 DIAGNOSIS — E785 Hyperlipidemia, unspecified: Secondary | ICD-10-CM

## 2014-04-12 NOTE — Patient Instructions (Signed)
Your physician wants you to follow-up in: DR HILTY IN ONE YEAR You will receive a reminder letter in the mail two months in advance. If you don't receive a letter, please call our office to schedule the follow-up appointment.   Your physician recommends that you return for lab work WHEN FASTING

## 2014-04-13 LAB — LIPID PANEL
CHOL/HDL RATIO: 3.3 ratio
Cholesterol: 184 mg/dL (ref 0–200)
HDL: 56 mg/dL (ref >39)
LDL Cholesterol: 114 mg/dL — ABNORMAL HIGH (ref 0–99)
Triglycerides: 72 mg/dL (ref <150)
VLDL: 14 mg/dL (ref 0–40)

## 2014-04-16 NOTE — Progress Notes (Signed)
Patient ID: [name], female   DOB: 1966-08-31, 48 y.o.   MRN: [id number]    04/12/2014 [name]   21-Aug-1966  [id number]  Primary Physicia Cathlean Cower, MD Primary Cardiologist: Dr. Debara Pickett  [name] presents to clinic today for post cath followup. Details regarding her history and recent heart catheterization are outlined in detail below.  HPI: This is a 48 y.o. female who has been suffering with symptoms of chest pain and palpitations for quite some time. Apparently in 2008 she had an episode of chest pain and was admitted to the hospital. She was treated and ruled out and continued to have very intermittent episodes of discomfort over the years. Recently she had some worsening symptoms including chest discomfort and feeling of palpitations. This is followed by significant fatigue and lack of energy for several days after the palpitations have resolved. It is associated with heaviness in the chest that is behind the sternum. It does not radiate to the arm or to the jaw. Sometimes the symptoms are worse when she is exerting herself and improve at rest. Other times they occur at rest. She had recently seen Dr. Terrence Dupont, for a cardiac opinion. He ordered a stress test which was performed at [facility]. 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 No evidence for inducible ischemia. Normal function with an EF of 66%". This was interpreted by a Development worker, community. Apparently she received a call saying her stress test was negative and she was told to followup with him in the office in 3 weeks. When she came back she was allegedly told that her stress test showed a prior heart attack. There does however appear to be conflicting data. Based on this she sought a second opinion, especially in light of recurrent symptoms. She was evaluated by Dr. Debara Pickett in  clinic. Given her ongoing symptoms, a definitive cardiac catheterization was recommended to evaluate her coronaries and ultimately determine whether or not she had had a prior infarct. The patient was in agreement with this approach and she underwent a left heart catheterization, performed by Dr. Debara Pickett on 03/28/2014. This revealed angiographically normal coronaries. Left ventricular ejection fraction was normal at 60-65%. She was noted to have normal hyperdynamic wall motion. IT was determined that she had a false positive nuclear stress study and no further cardiac workup was indicated.  In regards to her palpitations, She reported that the palpitation episodes are actually fairly infrequent. She says she's had about 3 episodes of this year and perhaps 2 or 3 episodes last year. She was advised by Dr. Debara Pickett to notify our office if she develops more frequent episodes as we can further evaluate this with cardiac event monitoring.  It should also be noted that she has hyperlipidemia. A lipid study in April 2015 revealed that she had an elevated LDL of 184. Since then she has been medicated with simvastatin.  She presents to clinic today for post cath followup. She denies any issues. The right radial access site is stable, free from hematoma. She denies any right arm numbness tingling or weakness. She denies any palpitations since last being seen by Dr. Debara Pickett.     Current Outpatient Prescriptions  Medication Sig Dispense Refill  . ALPRAZolam (XANAX) 0.5 MG tablet Take 0.25 mg by mouth 2 (two) times daily as needed for anxiety.      Marland Kitchen  aspirin EC 81 MG tablet Take 81 mg by mouth daily.      . brimonidine (ALPHAGAN) 0.2 % ophthalmic solution Place 1 drop into the left eye 2 (two) times daily.      . cholecalciferol (VITAMIN D) 1000 UNITS tablet Take 1,000 Units by mouth daily.      Marland Kitchen ibuprofen (ADVIL,MOTRIN) 200 MG tablet Take 400 mg by mouth every 6 (six) hours as needed for fever, headache or mild pain.       . Multiple Minerals (CALCIUM-MAGNESIUM-ZINC) TABS Take 1 tablet by mouth daily.      Marland Kitchen NITROSTAT 0.4 MG SL tablet Place 1 tablet under the tongue every 15 (fifteen) minutes as needed for chest pain.       . simvastatin (ZOCOR) 40 MG tablet Take 40 mg by mouth daily.       No current facility-administered medications for this visit.    Allergies  Allergen Reactions  . Lipitor [Atorvastatin] Other (See Comments)    myalgia  . Lovastatin     myalgias  . Simvastatin     Wt gain  . Statins Nausea And Vomiting  . Topamax     dizziness    History   Social History  . Marital Status: Single    Spouse Name: N/A    Number of Children: 1  . Years of Education: N/A   Occupational History  . Disabled due to "MS" since 2005    Social History Main Topics  . Smoking status: Never Smoker   . Smokeless tobacco: Not on file  . Alcohol Use: No  . Drug Use: No  . Sexual Activity: Not on file   Other Topics Concern  . Not on file   Social History Narrative  . No narrative on file     Review of Systems: General: negative for chills, fever, night sweats or weight changes.  Cardiovascular: negative for chest pain, dyspnea on exertion, edema, orthopnea, palpitations, paroxysmal nocturnal dyspnea or shortness of breath Dermatological: negative for rash Respiratory: negative for cough or wheezing Urologic: negative for hematuria Abdominal: negative for nausea, vomiting, diarrhea, bright red blood per rectum, melena, or hematemesis Neurologic: negative for visual changes, syncope, or dizziness All other systems reviewed and are otherwise negative except as noted above.    Blood pressure 114/70, pulse 66, height 5' 3.5" (1.613 m), weight 207 lb (93.895 kg).  General appearance: alert, cooperative and no distress Neck: no carotid bruit and no JVD Lungs: clear to auscultation bilaterally Heart: regular rate and rhythm, S1, S2 normal, no murmur, click, rub or gallop Extremities: no  LEE Pulses: 2+ and symmetric Skin: warm and dry Neurologic: Grossly normal  EKG Not performed  ASSESSMENT AND PLAN:   1. Chest pain/"abnormal nuclear stress test": Left heart catheterization revealed angiographically normal coronary arteries. Her stress test was a false positive test. No further w/u needed. Right radial access site is stable.  2. Palpitations: She denies any recent episodes.  She is instructed to notify our office if she has any recurrence, as we can further evaluate this by cardiac event monitoring.   3. Hyperlipidemia: Her last lipid panel was in April of this year and LDL was elevated at 184. Since that time, she has been medicated with simvastatin. She has not eaten today, I recommended that we recheck a fasting lipid panel to see if she has had a positive response to simvastatin. If no significant improvement, we may consider initiation of Zetia as an adjunct. Also discussed the  importance of increasing physical activity and maintaining a proper diet to help lower cholesterol levels even further.  PLAN  Followup with Dr. Debara Pickett in one year so that we can continue to monitor/manage her hyperlipidemia as this is a cardiac risk factor. She has been instructed to followup sooner if she has any recurrence of palpitations so we can further evaluate with cardiac event monitoring.   SIMMONS, BRITTAINYPA-C 04/12/2014 1:11 PM

## 2014-06-20 ENCOUNTER — Other Ambulatory Visit: Payer: Self-pay

## 2014-06-20 MED ORDER — ALPRAZOLAM 0.5 MG PO TABS: 0.2500 mg | ORAL_TABLET | Freq: Two times a day (BID) | ORAL | Status: DC | PRN

## 2014-06-20 NOTE — Telephone Encounter (Signed)
Faxed script back to [facility].../lmb 

## 2014-06-20 NOTE — Telephone Encounter (Signed)
Done hardcopy to robin  

## 2014-06-23 ENCOUNTER — Other Ambulatory Visit: Payer: Self-pay

## 2014-08-17 ENCOUNTER — Encounter (HOSPITAL_COMMUNITY): Payer: Self-pay | Admitting: Internal Medicine

## 2014-08-20 ENCOUNTER — Ambulatory Visit (INDEPENDENT_AMBULATORY_CARE_PROVIDER_SITE_OTHER): Payer: BC Managed Care – PPO | Admitting: Physician Assistant

## 2014-08-20 VITALS — BP 112/74 | HR 64 | Temp 98.1°F | Resp 16 | Ht 64.0 in | Wt 213.4 lb

## 2014-08-20 DIAGNOSIS — J019 Acute sinusitis, unspecified: Secondary | ICD-10-CM

## 2014-08-20 DIAGNOSIS — H9202 Otalgia, left ear: Secondary | ICD-10-CM

## 2014-08-20 MED ORDER — AMOXICILLIN-POT CLAVULANATE 875-125 MG PO TABS: 1.0000 | ORAL_TABLET | Freq: Two times a day (BID) | ORAL | Status: DC

## 2014-08-20 NOTE — Patient Instructions (Signed)
Get plenty of rest and drink at least 64 ounces of water daily. 

## 2014-08-20 NOTE — Progress Notes (Signed)
Subjective:    Patient ID: [name], female    DOB: 09-06-1966, 48 y.o.   MRN: [id number]   PCP: Cathlean Cower, MD  Chief Complaint  Patient presents with  . Sore Throat  . Otalgia    Allergies  Allergen Reactions  . Lipitor [Atorvastatin] Other (See Comments)    myalgia  . Lovastatin     myalgias  . Simvastatin     Wt gain  . Statins Nausea And Vomiting  . Topamax     dizziness    Patient Active Problem List   Diagnosis Date Noted  . Chest pain 03/28/2014  . Palpitations 03/28/2014  . Abnormal nuclear stress test 03/28/2014  . Myalgia 12/14/2013  . Osteoporosis 09/24/2012  . Fracture, foot 08/04/2012  . Encounter for long-term (current) use of high-risk medication 05/26/2011  . Preventative health care 04/23/2011  . Allergic rhinitis 12/10/2010  . Unspecified vitamin D deficiency 10/23/2010  . Multiple sclerosis 10/23/2010  . HYPERLIPIDEMIA 03/27/2010  . ANXIETY 03/27/2010  . COMMON MIGRAINE 03/27/2010  . GERD 03/27/2010  . [place] DISEASE, CERVICAL 03/27/2010  . BACK PAIN, CHRONIC 03/27/2010    Prior to Admission medications   Medication Sig Start Date End Date Taking? Authorizing Provider  ALPRAZolam Duanne Moron) 0.5 MG tablet Take 0.5 tablets (0.25 mg total) by mouth 2 (two) times daily as needed for anxiety. 06/20/14  Yes Biagio Borg, MD  aspirin EC 81 MG tablet Take 81 mg by mouth daily.   Yes Historical Provider, MD  brimonidine (ALPHAGAN) 0.2 % ophthalmic solution Place 1 drop into the left eye 2 (two) times daily.   Yes Historical Provider, MD  cholecalciferol (VITAMIN D) 1000 UNITS tablet Take 1,000 Units by mouth daily.   Yes Historical Provider, MD  ibuprofen (ADVIL,MOTRIN) 200 MG tablet Take 400 mg by mouth every 6 (six) hours as needed for fever, headache or mild pain.   Yes Historical Provider, MD  Multiple Minerals (CALCIUM-MAGNESIUM-ZINC) TABS Take 1 tablet by mouth daily.   Yes Historical Provider, MD  NITROSTAT 0.4 MG SL tablet Place 1 tablet  under the tongue every 15 (fifteen) minutes as needed for chest pain.  12/20/13  Yes Historical Provider, MD  simvastatin (ZOCOR) 40 MG tablet Take 40 mg by mouth daily. 03/24/14  Yes Historical Provider, MD    Medical, Surgical, Family and Social History reviewed and updated.  HPI  [name] presents reporting several days of LEFT ear pain, LEFT sided sinus pressure and now LEFT sided sore throat.  She reports LEFT-sided otitis following sinus infections and colds since childhood. Typically twice annually in adulthood, usually in November-December and again in April. She developed congestion and drainage about 3 weeks ago. Those symptoms have resolved, leaving the pressure on the LEFT.  No fever, chills, GI/GU symptoms, new myalgias/arthralgias or rash.  Review of Systems As above.    Objective:   Physical Exam  Constitutional: She is oriented to person, place, and time. Vital signs are normal. She appears well-developed and well-nourished. No distress.  BP 112/74 mmHg  Pulse 64  Temp(Src) 98.1 F (36.7 C) (Oral)  Resp 16  Ht 5\' 4"  (1.626 m)  Wt 213 lb 6.4 oz (96.798 kg)  BMI 36.61 kg/m2  SpO2 100%   HENT:  Head: Normocephalic and atraumatic.  Right Ear: Hearing, tympanic membrane, external ear and ear canal normal.  Left Ear: Hearing, tympanic membrane, external ear and ear canal normal.  Nose: Nose normal.  No foreign bodies. Right sinus exhibits no  maxillary sinus tenderness and no frontal sinus tenderness. Left sinus exhibits no maxillary sinus tenderness and no frontal sinus tenderness.  Mouth/Throat: Uvula is midline, oropharynx is clear and moist and mucous membranes are normal. No uvula swelling. No oropharyngeal exudate.  Eyes: Conjunctivae and EOM are normal. Pupils are equal, round, and reactive to light. Right eye exhibits no discharge. Left eye exhibits no discharge. No scleral icterus.  Neck: Trachea normal, normal range of motion and full passive range of motion without  pain. Neck supple. No thyroid mass and no thyromegaly present.  Cardiovascular: Normal rate, regular rhythm and normal heart sounds.   Pulmonary/Chest: Effort normal and breath sounds normal.  Lymphadenopathy:       Head (right side): No submandibular, no tonsillar, no preauricular, no posterior auricular and no occipital adenopathy present.       Head (left side): No submandibular, no tonsillar, no preauricular and no occipital adenopathy present.    She has no cervical adenopathy.       Right: No supraclavicular adenopathy present.       Left: No supraclavicular adenopathy present.  Neurological: She is alert and oriented to person, place, and time. She has normal strength. No cranial nerve deficit or sensory deficit.  Skin: Skin is warm, dry and intact. No rash noted.  Psychiatric: She has a normal mood and affect. Her speech is normal and behavior is normal.          Assessment & Plan:  1. Otalgia of left ear 2. Subacute sinusitis, unspecified location Supportive care.  Meds ordered this encounter  Medications  . amoxicillin-clavulanate (AUGMENTIN) 875-125 MG per tablet    Sig: Take 1 tablet by mouth 2 (two) times daily.    Dispense:  20 tablet    Refill:  0    Order Specific Question:  Supervising Provider    Answer:  DOOLITTLE, ROBERT P [[id number]]      Fara Chute, PA-C Physician Assistant-Certified [facility]

## 2014-08-23 ENCOUNTER — Telehealth: Payer: Self-pay

## 2014-08-23 MED ORDER — CLARITHROMYCIN ER 500 MG PO TB24: 1000.0000 mg | ORAL_TABLET | Freq: Every day | ORAL | Status: AC

## 2014-08-23 NOTE — Telephone Encounter (Signed)
Please advise patient I've sent in another antibiotic. While she's on this one, temporarily stop the simvastatin (Zocor), as taking the two together can increase the side effects of both.

## 2014-08-23 NOTE — Telephone Encounter (Signed)
Pt called and reported that when she took the Augmentin Rxd to her Sunday, she broke out in a fine rash, she kept getting hot periodically and her "throat felt funny". She took Benedryl and hasn't taken any more medication and Sxs resolved after 2-3 hrs. Pt would like another Abx called in for her. She reported that she has taken plain amox just fine in the past w/no reaction. I advised pt that if she ever has a similar reaction in the future, to take the Benedryl but get to the doctor/ER depending upon severity if reaction involves throat or swelling on lips/face because Sxs can worsen. I have added Augmentin to allergy list. Chelle, please advise.

## 2014-08-24 NOTE — Telephone Encounter (Signed)
Attempted to call pt. Left detailed VM that another antibiotic has been sent in; also said to stop taking simvastatin until she is done with the new antibiotic.

## 2014-09-15 DIAGNOSIS — Z1231 Encounter for screening mammogram for malignant neoplasm of breast: Secondary | ICD-10-CM | POA: Diagnosis not present

## 2014-09-25 ENCOUNTER — Encounter: Payer: Self-pay | Admitting: Internal Medicine

## 2014-09-26 DIAGNOSIS — H4011X2 Primary open-angle glaucoma, moderate stage: Secondary | ICD-10-CM | POA: Diagnosis not present

## 2014-09-28 DIAGNOSIS — H4011X2 Primary open-angle glaucoma, moderate stage: Secondary | ICD-10-CM | POA: Diagnosis not present

## 2014-10-02 DIAGNOSIS — H4011X2 Primary open-angle glaucoma, moderate stage: Secondary | ICD-10-CM | POA: Diagnosis not present

## 2014-10-03 DIAGNOSIS — H4011X2 Primary open-angle glaucoma, moderate stage: Secondary | ICD-10-CM | POA: Diagnosis not present

## 2014-10-04 DIAGNOSIS — H04322 Acute dacryocystitis of left lacrimal passage: Secondary | ICD-10-CM | POA: Diagnosis not present

## 2014-10-04 DIAGNOSIS — H4011X2 Primary open-angle glaucoma, moderate stage: Secondary | ICD-10-CM | POA: Diagnosis not present

## 2014-10-09 DIAGNOSIS — H4011X2 Primary open-angle glaucoma, moderate stage: Secondary | ICD-10-CM | POA: Diagnosis not present

## 2014-11-08 DIAGNOSIS — H4011X1 Primary open-angle glaucoma, mild stage: Secondary | ICD-10-CM | POA: Diagnosis not present

## 2014-11-13 DIAGNOSIS — H43392 Other vitreous opacities, left eye: Secondary | ICD-10-CM | POA: Diagnosis not present

## 2014-11-13 DIAGNOSIS — H43391 Other vitreous opacities, right eye: Secondary | ICD-10-CM | POA: Diagnosis not present

## 2014-11-15 DIAGNOSIS — H4011X2 Primary open-angle glaucoma, moderate stage: Secondary | ICD-10-CM | POA: Diagnosis not present

## 2014-11-22 ENCOUNTER — Encounter (HOSPITAL_COMMUNITY): Payer: Self-pay | Admitting: Emergency Medicine

## 2014-11-22 ENCOUNTER — Emergency Department (HOSPITAL_COMMUNITY): Payer: BLUE CROSS/BLUE SHIELD

## 2014-11-22 ENCOUNTER — Emergency Department (HOSPITAL_COMMUNITY)
Admission: EM | Admit: 2014-11-22 | Discharge: 2014-11-22 | Disposition: A | Discharge status: Home / Self Care | Payer: BLUE CROSS/BLUE SHIELD | Attending: Emergency Medicine | Admitting: Emergency Medicine

## 2014-11-22 DIAGNOSIS — K219 Gastro-esophageal reflux disease without esophagitis: Secondary | ICD-10-CM | POA: Insufficient documentation

## 2014-11-22 DIAGNOSIS — F419 Anxiety disorder, unspecified: Secondary | ICD-10-CM | POA: Diagnosis not present

## 2014-11-22 DIAGNOSIS — R200 Anesthesia of skin: Secondary | ICD-10-CM

## 2014-11-22 DIAGNOSIS — R002 Palpitations: Secondary | ICD-10-CM | POA: Diagnosis not present

## 2014-11-22 DIAGNOSIS — Z3202 Encounter for pregnancy test, result negative: Secondary | ICD-10-CM | POA: Insufficient documentation

## 2014-11-22 DIAGNOSIS — H538 Other visual disturbances: Secondary | ICD-10-CM | POA: Insufficient documentation

## 2014-11-22 DIAGNOSIS — M810 Age-related osteoporosis without current pathological fracture: Secondary | ICD-10-CM | POA: Diagnosis not present

## 2014-11-22 DIAGNOSIS — Z8679 Personal history of other diseases of the circulatory system: Secondary | ICD-10-CM | POA: Diagnosis not present

## 2014-11-22 DIAGNOSIS — E785 Hyperlipidemia, unspecified: Secondary | ICD-10-CM | POA: Diagnosis not present

## 2014-11-22 DIAGNOSIS — Z8669 Personal history of other diseases of the nervous system and sense organs: Secondary | ICD-10-CM | POA: Diagnosis not present

## 2014-11-22 DIAGNOSIS — Z9889 Other specified postprocedural states: Secondary | ICD-10-CM | POA: Insufficient documentation

## 2014-11-22 DIAGNOSIS — Z79899 Other long term (current) drug therapy: Secondary | ICD-10-CM | POA: Insufficient documentation

## 2014-11-22 DIAGNOSIS — R42 Dizziness and giddiness: Secondary | ICD-10-CM | POA: Diagnosis not present

## 2014-11-22 DIAGNOSIS — R202 Paresthesia of skin: Secondary | ICD-10-CM | POA: Insufficient documentation

## 2014-11-22 DIAGNOSIS — E559 Vitamin D deficiency, unspecified: Secondary | ICD-10-CM | POA: Insufficient documentation

## 2014-11-22 DIAGNOSIS — G8929 Other chronic pain: Secondary | ICD-10-CM | POA: Diagnosis not present

## 2014-11-22 LAB — COMPREHENSIVE METABOLIC PANEL
ALK PHOS: 72 U/L (ref 39–117)
ALT: 16 U/L (ref 0–35)
AST: 23 U/L (ref 0–37)
Albumin: 3.8 g/dL (ref 3.5–5.2)
Anion gap: 2 — ABNORMAL LOW (ref 5–15)
BUN: 9 mg/dL (ref 6–23)
CALCIUM: 8.4 mg/dL (ref 8.4–10.5)
CO2: 28 mmol/L (ref 19–32)
Chloride: 105 mmol/L (ref 96–112)
Creatinine, Ser: 0.56 mg/dL (ref 0.50–1.10)
GFR calc Af Amer: >90 mL/min (ref >90)
GFR calc non Af Amer: >90 mL/min (ref >90)
GLUCOSE: 94 mg/dL (ref 70–99)
POTASSIUM: 4 mmol/L (ref 3.5–5.1)
SODIUM: 135 mmol/L (ref 135–145)
Total Bilirubin: 0.7 mg/dL (ref 0.3–1.2)
Total Protein: 7.4 g/dL (ref 6.0–8.3)

## 2014-11-22 LAB — CBC WITH DIFFERENTIAL/PLATELET
Basophils Absolute: 0 10*3/uL (ref 0.0–0.1)
Basophils Relative: 0 % (ref 0–1)
EOS PCT: 2 % (ref 0–5)
Eosinophils Absolute: 0.1 10*3/uL (ref 0.0–0.7)
HEMATOCRIT: 39.1 % (ref 36.0–46.0)
Hemoglobin: 13 g/dL (ref 12.0–15.0)
LYMPHS ABS: 2.7 10*3/uL (ref 0.7–4.0)
Lymphocytes Relative: 43 % (ref 12–46)
MCH: 31.1 pg (ref 26.0–34.0)
MCHC: 33.2 g/dL (ref 30.0–36.0)
MCV: 93.5 fL (ref 78.0–100.0)
MONOS PCT: 8 % (ref 3–12)
Monocytes Absolute: 0.5 10*3/uL (ref 0.1–1.0)
NEUTROS PCT: 47 % (ref 43–77)
Neutro Abs: 2.8 10*3/uL (ref 1.7–7.7)
Platelets: 301 10*3/uL (ref 150–400)
RBC: 4.18 MIL/uL (ref 3.87–5.11)
RDW: 13.8 % (ref 11.5–15.5)
WBC: 6.1 10*3/uL (ref 4.0–10.5)

## 2014-11-22 LAB — POC URINE PREG, ED: Preg Test, Ur: NEGATIVE

## 2014-11-22 MED ORDER — SODIUM CHLORIDE 0.9 % IV BOLUS (SEPSIS)
1000.0000 mL | Freq: Once | INTRAVENOUS | Status: AC
  Administered 2014-11-22: 1000 mL via INTRAVENOUS

## 2014-11-22 NOTE — ED Provider Notes (Signed)
CSN: [id number]     Arrival date & time 11/22/14  1159 History   First MD Initiated Contact with Patient 11/22/14 1313     Chief Complaint  Patient presents with  . Dizziness  . Blurred Vision  . Pins and needles in left arm   . Palpitations     (Consider location/radiation/quality/duration/timing/severity/associated sxs/prior Treatment) HPI  49 year old female presents with dizziness and transient left eye blurry vision. She states that she started feeling a little dizzy last night as well as having left-sided dizziness. She states that her whole left head feels full and dizzy. There is no ear ringing. She has no headache. No neck pain or stiffness. No fevers or chills. When she woke up this morning her left eye was blurry and she is unable to read the clock across the room. 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 For the past couple days she's also been having tingling in both hands and feet. No weakness. Patient has not had new vomiting. Patient also has been having palpitations, most recently last week. This is a chronic issue for her. Currently she has a mild dizziness to her left head but denies headache and feels much better than she was this morning.  Past Medical History  Diagnosis Date  . HYPERLIPIDEMIA 03/27/2010  . ANXIETY 03/27/2010  . COMMON MIGRAINE 03/27/2010  . GERD 03/27/2010  . [place] DISEASE, CERVICAL 03/27/2010  . BACK PAIN, CHRONIC 03/27/2010  . FATIGUE 03/27/2010  . Unspecified vitamin D deficiency 10/23/2010  . Multiple sclerosis 10/23/2010    unconfirmed, but suspected  . Osteoporosis 09/24/2012   Past Surgical History  Procedure Laterality Date  . Abdominal hysterectomy    . S/p knee surgury    . S/p eye surgury    . Neg stress test  2007  . Left heart catheterization with coronary angiogram N/A 03/31/2014    Procedure: LEFT HEART CATHETERIZATION  WITH CORONARY ANGIOGRAM;  Surgeon: Pixie Casino, MD;  Location[facility] CATH LAB;  Service: Cardiovascular;  Laterality: N/A;   Family History  Problem Relation Age of Onset  . Heart disease Father   . Diabetes Father   . Hyperlipidemia Father   . Hypertension Father   . Diabetes Other   . Arthritis Other   . Dementia Other   . Cancer Other     2 uncles with lung cancer  . Heart disease Other     2 uncles with heart disease  . Cancer Mother   . Hyperlipidemia Mother    History  Substance Use Topics  . Smoking status: Never Smoker   . Smokeless tobacco: Never Used  . Alcohol Use: No   OB History    No data available     Review of Systems  Constitutional: Negative for fever.  Eyes: Positive for visual disturbance.  Respiratory: Negative for shortness of breath.   Cardiovascular: Positive for palpitations. Negative for chest pain.  Gastrointestinal: Negative for vomiting and abdominal pain.  Genitourinary: Negative for dysuria.  Neurological: Positive for dizziness. Negative for syncope, speech difficulty, weakness, light-headedness, numbness and headaches.       Paresthesias  All other systems reviewed and are negative.     Allergies  Augmentin; Lipitor; Lovastatin; Statins; and Topamax  Home Medications   Prior to Admission medications   Medication Sig Start Date End Date Taking? Authorizing Provider  ALPRAZolam Duanne Moron) 0.5 MG  tablet Take 0.5 tablets (0.25 mg total) by mouth 2 (two) times daily as needed for anxiety. 06/20/14  Yes Biagio Borg, MD  brimonidine (ALPHAGAN) 0.2 % ophthalmic solution Place 1 drop into the left eye 2 (two) times daily.   Yes Historical Provider, MD  cholecalciferol (VITAMIN D) 1000 UNITS tablet Take 1,000 Units by mouth daily.   Yes Historical Provider, MD  ibuprofen (ADVIL,MOTRIN) 200 MG tablet Take 400 mg by mouth every 6 (six) hours as needed for fever, headache or mild pain.   Yes Historical Provider, MD  Multiple Minerals  (CALCIUM-MAGNESIUM-ZINC) TABS Take 1 tablet by mouth daily.   Yes Historical Provider, MD  NITROSTAT 0.4 MG SL tablet Place 1 tablet under the tongue every 15 (fifteen) minutes as needed for chest pain.  12/20/13  Yes Historical Provider, MD  ranitidine (ZANTAC) 75 MG tablet Take 75 mg by mouth daily as needed for heartburn.   Yes Historical Provider, MD  simvastatin (ZOCOR) 40 MG tablet Take 40 mg by mouth daily. 03/24/14  Yes Historical Provider, MD   BP 105/77 mmHg  Pulse 74  Temp(Src) 97.9 F (36.6 C) (Oral)  Resp 13  SpO2 100% Physical Exam  Constitutional: She is oriented to person, place, and time. She appears well-developed and well-nourished.  HENT:  Head: Normocephalic and atraumatic.  Right Ear: Tympanic membrane, external ear and ear canal normal.  Left Ear: Tympanic membrane, external ear and ear canal normal.  Nose: Nose normal.  Eyes: Conjunctivae and EOM are normal. Right eye exhibits no discharge. Left eye exhibits no discharge.  Right eye moderately dilated without reactivity. Left eye normal pupil size, reacts appropriately  Cardiovascular: Normal rate, regular rhythm and normal heart sounds.   Pulmonary/Chest: Effort normal and breath sounds normal.  Abdominal: Soft. She exhibits no distension. There is no tenderness.  Neurological: She is alert and oriented to person, place, and time.  CN 2-12 grossly intact. 5/5 strength in all 4 extremities. Normal gross sensation. Normal finger to nose. No pronator drift  Skin: Skin is warm and dry.  Nursing note and vitals reviewed.   ED Course  Procedures (including critical care time) Labs Review Labs Reviewed  COMPREHENSIVE METABOLIC PANEL - Abnormal; Notable for the following:    Anion gap 2 (*)    All other components within normal limits  CBC WITH DIFFERENTIAL/PLATELET  POC URINE PREG, ED    Imaging Review Mr Brain Wo Contrast  11/22/2014   CLINICAL DATA:  Blurred vision of the left eye. Paresthesia is of the  left arm. Left-sided weakness. Symptoms began last week.  EXAM: MRI HEAD WITHOUT CONTRAST  TECHNIQUE: Multiplanar, multiecho pulse sequences of the brain and surrounding structures were obtained without intravenous contrast.  COMPARISON:  11/09/2010  FINDINGS: Diffusion imaging does not show any acute or subacute infarction. The brainstem and cerebellum are normal. Within the cerebral hemispheres, there are scattered foci of T2 and FLAIR signal in the white matter which are nonspecific. In a female of this age, this could be due to demyelinating disease. Differential diagnosis is that of an early manifestation of small vessel disease, migraine related foci, and old nonspecific brain insults. No cortical or large vessel territory abnormality. No mass lesion, hemorrhage, hydrocephalus or extra-axial collection. There is an insignificant small lipoma along the posterior corpus callosum, not of any significance. No pituitary mass. There is fluid in the mastoid air cells, right more than left. Paranasal sinuses are clear. 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  IMPRESSION:  No acute or subacute infarction.  Scattered foci of abnormal T2 signal in the cerebral hemispheric white matter raising the possibility of multiple sclerosis. See above discussion.   Electronically Signed   By: Nelson Chimes M.D.   On: 11/22/2014 15:19     EKG Interpretation   Date/Time:  Wednesday November 22 2014 12:06:17 EDT Ventricular Rate:  67 PR Interval:  171 QRS Duration: 82 QT Interval:  382 QTC Calculation: 403 R Axis:   8 Text Interpretation:  Sinus rhythm Low voltage, precordial leads Baseline  wander in lead(s) III aVF V5 no significant change since July 2015  Confirmed by Regenia Skeeter  MD, Kamilya Wakeman ([id number]) on 11/22/2014 1:20:51 PM      MDM   Final diagnoses:  Numbness and tingling    Patient's neurologic exam is normal. Her eye exam is asymmetric but this is chronic due to her prior history of  retinal detachment. Her MRI initially is concerning for possible abnormalities such as MS. I discussed with Dr. Leonel Ramsay who reviewed the MRI and feels that this is not MS and feels that is more likely nonspecific finding seen on multiple MRIs for many people. He compared this MRI to her 2012 MRI and states they're essentially unchanged. He does not think she has MS. She has followed up with [facility] in the past, I will refer her back there. As for her paresthesias, there is no significant electrolyte abnormality to account for this. Given that she is essentially a symptomatically this point I feel she is stable for discharge. Recommended close f/u with PCP.    Sherwood Gambler, MD 11/22/14 [phone]

## 2014-11-22 NOTE — ED Notes (Signed)
Patient transported to MRI 

## 2014-11-22 NOTE — ED Notes (Addendum)
Pt c/o blurry vision (left eye), left arm pain "pins and needles", slight weakness on left side (starting last week while driving and "chest started tightening up and I couldn't hardly breathe and my heart was racing). Says, "I thought I was having a panic attack last week but I've never been diagnosed with that. I didn't go with EMS because I felt better by the time they came. I've just been feeling worse and worse and weaker and weaker since then." Current symptoms started today around 0800. Denies hx anxiety. Has Xanax for migraine history. Took 0.25 mg yesterday. Was told by PCP that "I had a heart attack but he didn't recommend I go anywhere. I took it upon myself to get a cardiologist. The cardiologist told me everything looked fine." Denies hx stroke, not on blood thinners. Patient is speaking full/clear sentences. RR even/unlabored. Grips moderate, equal. Dorsiflexion and extension moderate, equal. Face symmetrical. Able to smile, touch tongue to top and bottom lip. No other c/c.

## 2014-12-04 ENCOUNTER — Telehealth: Payer: Self-pay | Admitting: Internal Medicine

## 2014-12-04 DIAGNOSIS — R930 Abnormal findings on diagnostic imaging of skull and head, not elsewhere classified: Secondary | ICD-10-CM

## 2014-12-04 DIAGNOSIS — R42 Dizziness and giddiness: Secondary | ICD-10-CM | POA: Diagnosis not present

## 2014-12-04 NOTE — Telephone Encounter (Signed)
Referral done

## 2014-12-04 NOTE — Telephone Encounter (Signed)
Patient is requesting a referral to [facility] for reading of previous MRI (dr. Jannifer Franklin).

## 2014-12-05 NOTE — Telephone Encounter (Signed)
Patient is aware of referral

## 2014-12-06 DIAGNOSIS — H4011X2 Primary open-angle glaucoma, moderate stage: Secondary | ICD-10-CM | POA: Diagnosis not present

## 2014-12-07 ENCOUNTER — Encounter: Payer: Self-pay | Admitting: Neurology

## 2014-12-07 ENCOUNTER — Ambulatory Visit (INDEPENDENT_AMBULATORY_CARE_PROVIDER_SITE_OTHER): Payer: BLUE CROSS/BLUE SHIELD | Admitting: Neurology

## 2014-12-07 VITALS — BP 103/75 | HR 72 | Ht 64.0 in | Wt 214.4 lb

## 2014-12-07 DIAGNOSIS — R202 Paresthesia of skin: Secondary | ICD-10-CM

## 2014-12-07 DIAGNOSIS — G43009 Migraine without aura, not intractable, without status migrainosus: Secondary | ICD-10-CM

## 2014-12-07 DIAGNOSIS — G35 Multiple sclerosis: Secondary | ICD-10-CM

## 2014-12-07 NOTE — Patient Instructions (Signed)

## 2014-12-07 NOTE — Progress Notes (Signed)
Reason for visit: Paresthesias  Referring physician: Dr. Doristine Johns is a 49 y.o. female  History of present illness:  [name] is a 49 year old right-handed black female who has been seen through this office previously, last seen on 04/03/2011 under the name of [name]. The past medical records were reviewed. The patient apparently has been seen and evaluated through [facility] approximately 15 years ago, and she was told that she had the diagnosis of multiple sclerosis. She was considered for Betaseron therapy, but this was never started. The patient has never been on any disease modifying agent over the last 15 years. The patient had MRI evaluation of the brain done in 2005, also with MRI of the cervical spine. The cervical spine MRI was completely normal, MRI of the brain showed minimal nonspecific white matter changes that could be seen with individuals with migraine headache. The patient does have a history of migraine, with persistent left-sided headaches that she claims are daily this point. The patient has had an increase in paresthesias on all 4 extremities that began in October 2015, and it has worsened since that time. She indicates that the arms and hands and the feet are involved. She has a area and the mid back with a burning sensation. She also has some squeezing sensations around the chest, and a sensation of shortness of breath. She also reports dizziness, left ear and temporal pain associated with her headache. The patient has some neck stiffness on the left side of the neck. She denies any issues controlling the bowels or the bladder, he denies any balance problems. She has not had any vision complaints with exception that she has had occasional events of loss of vision that last several moments, which then returns. I have reviewed the MRI brain evaluations from 2005 and the one done in March 2016, and I see very little change in the MRI evaluations. The formal reading on MRI  the brain done in March 2016 indicates that is consistent with multiple sclerosis. I disagree with the reading, the MRI is nonspecific, multiple sclerosis cannot be excluded, but the study is not typical for multiple sclerosis. The patient is sent back to this office for reevaluation.  Past Medical History  Diagnosis Date  . HYPERLIPIDEMIA 03/27/2010  . ANXIETY 03/27/2010  . COMMON MIGRAINE 03/27/2010  . GERD 03/27/2010  . [place] DISEASE, CERVICAL 03/27/2010  . BACK PAIN, CHRONIC 03/27/2010  . FATIGUE 03/27/2010  . Unspecified vitamin D deficiency 10/23/2010  . Multiple sclerosis 10/23/2010    unconfirmed, but suspected  . Osteoporosis 09/24/2012    Past Surgical History  Procedure Laterality Date  . Abdominal hysterectomy    . S/p knee surgury    . S/p eye surgury    . Neg stress test  2007  . Left heart catheterization with coronary angiogram N/A 03/31/2014    Procedure: LEFT HEART CATHETERIZATION WITH CORONARY ANGIOGRAM;  Surgeon: Pixie Casino, MD;  Location: [facility] CATH LAB;  Service: Cardiovascular;  Laterality: N/A;    Family History  Problem Relation Age of Onset  . Heart disease Father   . Diabetes Father   . Hyperlipidemia Father   . Hypertension Father   . Diabetes Other   . Arthritis Other   . Dementia Other   . Cancer Other     2 uncles with lung cancer  . Heart disease Other     2 uncles with heart disease  . Cancer Mother     breast  .  Hyperlipidemia Mother   . Lupus Maternal Aunt   . Lupus Cousin     mother and father's side    Social history:  reports that she has never smoked. She has never used smokeless tobacco. She reports that she does not drink alcohol or use illicit drugs.  Medications:  Prior to Admission medications   Medication Sig Start Date End Date Taking? Authorizing Provider  ALPRAZolam Duanne Moron) 0.5 MG tablet Take 0.5 tablets (0.25 mg total) by mouth 2 (two) times daily as needed for anxiety. 06/20/14  Yes Biagio Borg, MD  brimonidine-timolol  (COMBIGAN) 0.2-0.5 % ophthalmic solution Place 1 drop into the left eye every 12 (twelve) hours.   Yes Historical Provider, MD  cholecalciferol (VITAMIN D) 1000 UNITS tablet Take 1,000 Units by mouth daily.   Yes Historical Provider, MD  ibuprofen (ADVIL,MOTRIN) 200 MG tablet Take 400 mg by mouth every 6 (six) hours as needed for fever, headache or mild pain.   Yes Historical Provider, MD  Multiple Minerals (CALCIUM-MAGNESIUM-ZINC) TABS Take 1 tablet by mouth daily.   Yes Historical Provider, MD  NITROSTAT 0.4 MG SL tablet Place 1 tablet under the tongue every 15 (fifteen) minutes as needed for chest pain.  12/20/13  Yes Historical Provider, MD  ranitidine (ZANTAC) 75 MG tablet Take 150 mg by mouth daily as needed for heartburn.    Yes Historical Provider, MD  simvastatin (ZOCOR) 40 MG tablet Take 40 mg by mouth daily. 03/24/14  Yes Historical Provider, MD      Allergies  Allergen Reactions  . Augmentin [Amoxicillin-Pot Clavulanate] Rash    Pt stated "throat felt funny"  . Lipitor [Atorvastatin] Other (See Comments)    myalgia  . Lovastatin     myalgias  . Statins Nausea And Vomiting  . Topamax     dizziness    ROS:  Out of a complete 14 system review of symptoms, the patient complains only of the following symptoms, and all other reviewed systems are negative.  Fatigue Chest pain Dizziness Loss of vision Shortness of breath Feeling hot, cold Headache, numbness, weakness, dizziness Not enough sleep, decreased energy  Blood pressure 103/75, pulse 72, height 5\' 4"  (1.626 m), weight 214 lb 6.4 oz (97.251 kg).  Physical Exam  General: The patient is alert and cooperative at the time of the examination.  Eyes: Pupils are equal, round, and reactive to light. Discs are flat bilaterally.  Neck: The neck is supple, no carotid bruits are noted.  Respiratory: The respiratory examination is clear.  Cardiovascular: The cardiovascular examination reveals a regular rate and rhythm, no  obvious murmurs or rubs are noted.  Skin: Extremities are without significant edema.  Neurologic Exam  Mental status: The patient is alert and oriented x 3 at the time of the examination. The patient has apparent normal recent and remote memory, with an apparently normal attention span and concentration ability.  Cranial nerves: Facial symmetry is present. There is good sensation of the face to pinprick and soft touch bilaterally. The strength of the facial muscles and the muscles to head turning and shoulder shrug are normal bilaterally. Speech is well enunciated, no aphasia or dysarthria is noted. Extraocular movements are full. Visual fields are full. The tongue is midline, and the patient has symmetric elevation of the soft palate. No obvious hearing deficits are noted.  Motor: The motor testing reveals 5 over 5 strength of all 4 extremities. Good symmetric motor tone is noted throughout.  Sensory: Sensory testing is intact to  pinprick, soft touch, vibration sensation, and position sense on all 4 extremities. No evidence of extinction is noted.  Coordination: Cerebellar testing reveals good finger-nose-finger and heel-to-shin bilaterally.  Gait and station: Gait is normal. Tandem gait is normal. Romberg is negative. No drift is seen.  Reflexes: Deep tendon reflexes are symmetric and normal bilaterally. Toes are downgoing bilaterally.   MRI brain 11/22/14:  IMPRESSION: No acute or subacute infarction.  Scattered foci of abnormal T2 signal in the cerebral hemispheric white matter raising the possibility of multiple sclerosis. See above discussion.   Assessment/Plan:  1. Paresthesias all 4 extremities  2. Migraine headache, left temporal  3. Minimally abnormal MRI brain  The patient appears to have stable white matter changes in the brain by MRI from 2005 to the present. The diagnosis of multiple sclerosis is in question. The patient has not been on any disease modifying  agents for 15 years. The patient will be set up for MRI evaluation of the cervical and thoracic spine with and without gadolinium enhancement. Blood work will be done today. The patient will undergo a visual evoked response test. She will follow-up in 3-4 months. If changes are found in the spinal cord, the diagnosis of MS would be more likely, and medications can be started at that time.    Jill Alexanders MD 12/07/2014 6:41 PM  [facility] [address] [place], [place] [address]  Phone [phone] Fax [phone]

## 2014-12-09 LAB — RHEUMATOID FACTOR: Rhuematoid fact SerPl-aCnc: 13 IU/mL (ref 0.0–13.9)

## 2014-12-09 LAB — HIV ANTIBODY (ROUTINE TESTING W REFLEX): HIV Screen 4th Generation wRfx: NONREACTIVE

## 2014-12-09 LAB — VITAMIN B12: VITAMIN B 12: 1339 pg/mL — AB (ref 211–946)

## 2014-12-09 LAB — COPPER, SERUM: Copper: 136 ug/dL (ref 72–166)

## 2014-12-09 LAB — NEUROMYELITIS OPTICA AUTOAB, IGG: NMO-IgG: <1.5 U/mL (ref 0.0–3.0)

## 2014-12-09 LAB — SEDIMENTATION RATE: SED RATE: 13 mm/h (ref 0–32)

## 2014-12-09 LAB — ANA W/REFLEX: Anti Nuclear Antibody(ANA): NEGATIVE

## 2014-12-09 LAB — ANGIOTENSIN CONVERTING ENZYME: Angio Convert Enzyme: 24 U/L (ref 14–82)

## 2014-12-09 LAB — RPR: RPR Ser Ql: NONREACTIVE

## 2014-12-12 ENCOUNTER — Telehealth: Payer: Self-pay | Admitting: Neurology

## 2014-12-12 ENCOUNTER — Ambulatory Visit (INDEPENDENT_AMBULATORY_CARE_PROVIDER_SITE_OTHER): Payer: BLUE CROSS/BLUE SHIELD | Admitting: Neurology

## 2014-12-12 DIAGNOSIS — R202 Paresthesia of skin: Secondary | ICD-10-CM

## 2014-12-12 DIAGNOSIS — G35 Multiple sclerosis: Secondary | ICD-10-CM | POA: Diagnosis not present

## 2014-12-12 DIAGNOSIS — G43009 Migraine without aura, not intractable, without status migrainosus: Secondary | ICD-10-CM

## 2014-12-12 NOTE — Telephone Encounter (Signed)
I called the patient. The visual response test was abnormal on the left. This would lead one to consider the possibility of multiple sclerosis Morse significant leg. The patient will have MRI evaluation cervical spine. If this is abnormal, the diagnosis will be confirmed. If not, lumbar puncture may be done in the future. I discussed this with patient.

## 2014-12-12 NOTE — Procedures (Signed)
    History:   [name] is a 49 year old patient with the diagnosis of multiple sclerosis in the past, without initiation of disease modifying medication. The patient has carried the diagnosis for at least 15 years. She has had MRI evaluation showing minimal small vessel changes in the brain. The patient is being evaluated for demyelinating disease  Description: The visual evoked response test was performed today using 32 x 32 check sizes. The absolute latencies for the N1 and the P100 wave forms were within normal limits on the right, prolonged on the left. The amplitudes for the P100 wave forms were also within normal limits bilaterally. The visual acuity was 20/200 OD and 20/30 OS uncorrected.  Impression:  The visual evoked response test above was within normal limits on the right, abnormal on the left with evidence of conduction slowing within the anterior visual pathways on the left. Visual acuity testing appears to be much worse on the right than the left, not correlating with the results of this test. This study would be consistent with an ischemic or demyelinating lesion within the anterior visual pathways on the left.

## 2014-12-26 DIAGNOSIS — H3531 Nonexudative age-related macular degeneration: Secondary | ICD-10-CM | POA: Diagnosis not present

## 2014-12-26 DIAGNOSIS — H15841 Scleral ectasia, right eye: Secondary | ICD-10-CM | POA: Diagnosis not present

## 2014-12-27 ENCOUNTER — Other Ambulatory Visit: Payer: Medicare Other

## 2014-12-27 ENCOUNTER — Ambulatory Visit
Admission: RE | Admit: 2014-12-27 | Discharge: 2014-12-27 | Disposition: A | Discharge status: Home / Self Care | Payer: BLUE CROSS/BLUE SHIELD | Source: Ambulatory Visit | Attending: Neurology | Admitting: Neurology

## 2014-12-27 DIAGNOSIS — G35 Multiple sclerosis: Secondary | ICD-10-CM | POA: Diagnosis not present

## 2014-12-27 DIAGNOSIS — G43009 Migraine without aura, not intractable, without status migrainosus: Secondary | ICD-10-CM

## 2014-12-27 DIAGNOSIS — R202 Paresthesia of skin: Secondary | ICD-10-CM | POA: Diagnosis not present

## 2014-12-27 MED ORDER — GADOBENATE DIMEGLUMINE 529 MG/ML IV SOLN
17.0000 mL | Freq: Once | INTRAVENOUS | Status: AC | PRN
  Administered 2014-12-27: 17 mL via INTRAVENOUS

## 2015-01-01 ENCOUNTER — Telehealth: Payer: Self-pay | Admitting: Neurology

## 2015-01-01 DIAGNOSIS — R9082 White matter disease, unspecified: Secondary | ICD-10-CM

## 2015-01-01 NOTE — Telephone Encounter (Signed)
I called the patient. The MRI of the cervical and thoracic cord was unremarkable. We will get a spinal tap to help confirm. VER was abnormal, suggestiing MS. the patient is amenable to this.   MRI cervical 12/27/14:  IMPRESSION: This is an abnormal MRI of the cervical spine with and without contrast showed the following: 1. Mild degenerative changes at C3-C4 with disc bulging, C4-C5 with uncovertebral spurring and disc bulging and at C5-C6 with disc bulging. There is no significant foraminal narrowing and no nerve root impingement. The changes at C4-C5 have progressed since the MRI dated 06/20/2004 with uncovertebral spurring noted on the current study. 2. The spinal cord appears normal. This is unchanged when compared to the prior MRI.   MRI throracic 12/27/14:  IMPRESSION: Is a mildly abnormal MRI of the thoracic spine with and without contrast showing minimal degenerative changes at T7-T8, T11-T12 and T12L1 with small disc bulges. The spinal cord appears normal. There are no acute findings.

## 2015-01-03 ENCOUNTER — Encounter: Payer: Self-pay | Admitting: Internal Medicine

## 2015-01-03 ENCOUNTER — Other Ambulatory Visit (INDEPENDENT_AMBULATORY_CARE_PROVIDER_SITE_OTHER): Payer: BLUE CROSS/BLUE SHIELD

## 2015-01-03 ENCOUNTER — Ambulatory Visit (INDEPENDENT_AMBULATORY_CARE_PROVIDER_SITE_OTHER): Payer: BLUE CROSS/BLUE SHIELD | Admitting: Internal Medicine

## 2015-01-03 VITALS — BP 122/80 | HR 64 | Temp 98.1°F | Resp 18 | Ht 64.0 in | Wt 210.2 lb

## 2015-01-03 DIAGNOSIS — G35 Multiple sclerosis: Secondary | ICD-10-CM

## 2015-01-03 DIAGNOSIS — Z0000 Encounter for general adult medical examination without abnormal findings: Secondary | ICD-10-CM

## 2015-01-03 LAB — HEPATIC FUNCTION PANEL
ALBUMIN: 4.1 g/dL (ref 3.5–5.2)
ALT: 12 U/L (ref 0–35)
AST: 16 U/L (ref 0–37)
Alkaline Phosphatase: 75 U/L (ref 39–117)
BILIRUBIN DIRECT: 0.1 mg/dL (ref 0.0–0.3)
BILIRUBIN TOTAL: 0.4 mg/dL (ref 0.2–1.2)
TOTAL PROTEIN: 7.6 g/dL (ref 6.0–8.3)

## 2015-01-03 LAB — CBC WITH DIFFERENTIAL/PLATELET
BASOS ABS: 0.1 10*3/uL (ref 0.0–0.1)
Basophils Relative: 1.7 % (ref 0.0–3.0)
EOS PCT: 1.6 % (ref 0.0–5.0)
Eosinophils Absolute: 0.1 10*3/uL (ref 0.0–0.7)
HEMATOCRIT: 41 % (ref 36.0–46.0)
Hemoglobin: 13.9 g/dL (ref 12.0–15.0)
Lymphocytes Relative: 40.9 % (ref 12.0–46.0)
Lymphs Abs: 2.8 10*3/uL (ref 0.7–4.0)
MCHC: 33.8 g/dL (ref 30.0–36.0)
MCV: 92.5 fl (ref 78.0–100.0)
MONOS PCT: 6.8 % (ref 3.0–12.0)
Monocytes Absolute: 0.5 10*3/uL (ref 0.1–1.0)
Neutro Abs: 3.3 10*3/uL (ref 1.4–7.7)
Neutrophils Relative %: 49 % (ref 43.0–77.0)
PLATELETS: 257 10*3/uL (ref 150.0–400.0)
RBC: 4.44 Mil/uL (ref 3.87–5.11)
RDW: 13.6 % (ref 11.5–15.5)
WBC: 6.7 10*3/uL (ref 4.0–10.5)

## 2015-01-03 LAB — URINALYSIS, ROUTINE W REFLEX MICROSCOPIC
Bilirubin Urine: NEGATIVE
HGB URINE DIPSTICK: NEGATIVE
Ketones, ur: NEGATIVE
Leukocytes, UA: NEGATIVE
NITRITE: NEGATIVE
PH: 7 (ref 5.0–8.0)
Specific Gravity, Urine: 1.02 (ref 1.000–1.030)
Total Protein, Urine: NEGATIVE
Urine Glucose: NEGATIVE
Urobilinogen, UA: 0.2 (ref 0.0–1.0)

## 2015-01-03 LAB — BASIC METABOLIC PANEL
BUN: 10 mg/dL (ref 6–23)
CALCIUM: 9.5 mg/dL (ref 8.4–10.5)
CO2: 29 mEq/L (ref 19–32)
Chloride: 104 mEq/L (ref 96–112)
Creatinine, Ser: 0.7 mg/dL (ref 0.40–1.20)
GFR: 114.16 mL/min (ref >60.00)
Glucose, Bld: 86 mg/dL (ref 70–99)
Potassium: 4.3 mEq/L (ref 3.5–5.1)
SODIUM: 135 meq/L (ref 135–145)

## 2015-01-03 LAB — LIPID PANEL
CHOLESTEROL: 199 mg/dL (ref 0–200)
HDL: 50.4 mg/dL (ref >39.00)
LDL Cholesterol: 133 mg/dL — ABNORMAL HIGH (ref 0–99)
NONHDL: 148.6
Total CHOL/HDL Ratio: 4
Triglycerides: 76 mg/dL (ref 0.0–149.0)
VLDL: 15.2 mg/dL (ref 0.0–40.0)

## 2015-01-03 LAB — TSH: TSH: 0.67 u[IU]/mL (ref 0.35–4.50)

## 2015-01-03 MED ORDER — CYCLOBENZAPRINE HCL 5 MG PO TABS: 5.0000 mg | ORAL_TABLET | Freq: Three times a day (TID) | ORAL | Status: DC | PRN

## 2015-01-03 MED ORDER — ALPRAZOLAM 0.5 MG PO TABS: 0.2500 mg | ORAL_TABLET | Freq: Two times a day (BID) | ORAL | Status: DC | PRN

## 2015-01-03 MED ORDER — SIMVASTATIN 40 MG PO TABS: 40.0000 mg | ORAL_TABLET | Freq: Every day | ORAL | Status: DC

## 2015-01-03 NOTE — Assessment & Plan Note (Signed)

## 2015-01-03 NOTE — Patient Instructions (Signed)
Please continue all other medications as before, and refills have been done if requested - the xanax and flexeril  Your disability form was filled out today  Please have the pharmacy call with any other refills you may need.  Please continue your efforts at being more active, low cholesterol diet, and weight control.  You are otherwise up to date with prevention measures today.  Please keep your appointments with your specialists as you may have planned  Please go to the LAB in the Basement (turn left off the elevator) for the tests to be done today  You will be contacted by phone if any changes need to be made immediately.  Otherwise, you will receive a letter about your results with an explanation, but please check with MyChart first.  Please remember to sign up for MyChart if you have not done so, as this will be important to you in the future with finding out test results, communicating by private email, and scheduling acute appointments online when needed.  Please return in 1 year for your yearly visit, or sooner if needed, with Lab testing done 3-5 days before

## 2015-01-03 NOTE — Assessment & Plan Note (Signed)
Disability form filled out today

## 2015-01-03 NOTE — Progress Notes (Signed)
Pre visit review using our clinic review tool, if applicable. No additional management support is needed unless otherwise documented below in the visit note. 

## 2015-01-03 NOTE — Progress Notes (Signed)
Subjective:    Patient ID: [name], female    DOB: [birth year], 49 y.o.   MRN: [id number]  HPI   Here for wellness and f/u;  Overall doing ok;  Pt denies Chest pain, worsening SOB, DOE, wheezing, orthopnea, PND, worsening LE edema, palpitations, dizziness or syncope.  Pt denies neurological change such as new headache, facial or extremity weakness.  Pt denies polydipsia, polyuria, or low sugar symptoms. Pt states overall good compliance with treatment and medications, good tolerability, and has been trying to follow appropriate diet.  Pt denies worsening depressive symptoms, suicidal ideation or panic. No fever, night sweats, wt loss, loss of appetite, or other constitutional symptoms.  Pt states good ability with ADL's, has low fall risk, home safety reviewed and adequate, no other significant changes in hearing or vision, and only occasionally active with exercise. Has been seeing Dr willis/neuro, recent test abnormal for possible MS per pt, then had brain/cervical/thoracic with brain MRI c/w possibl MS.  For LP suggested per neuro next. Still having numbness /pins and needles to legs since aug 2015, double vision left only, and only in the AM.   Past Medical History  Diagnosis Date  . HYPERLIPIDEMIA 03/27/2010  . ANXIETY 03/27/2010  . COMMON MIGRAINE 03/27/2010  . GERD 03/27/2010  . [place] DISEASE, CERVICAL 03/27/2010  . BACK PAIN, CHRONIC 03/27/2010  . FATIGUE 03/27/2010  . Unspecified vitamin D deficiency 10/23/2010  . Multiple sclerosis 10/23/2010    unconfirmed, but suspected  . Osteoporosis 09/24/2012   Past Surgical History  Procedure Laterality Date  . Abdominal hysterectomy    . S/p knee surgury    . S/p eye surgury    . Neg stress test  2007  . Left heart catheterization with coronary angiogram N/A 03/31/2014    Procedure: LEFT HEART CATHETERIZATION WITH CORONARY ANGIOGRAM;  Surgeon: Pixie Casino, MD;  [facility] CATH LAB;  Service: Cardiovascular;  Laterality: N/A;    reports that she has never smoked. She has never used smokeless tobacco. She reports that she does not drink alcohol or use illicit drugs. family history includes Arthritis in her other; Cancer in her mother and other; Dementia in her other; Diabetes in her father and other; Heart disease in her father and other; Hyperlipidemia in her father and mother; Hypertension in her father; Lupus in her cousin and maternal aunt. Allergies  Allergen Reactions  . Augmentin [Amoxicillin-Pot Clavulanate] Rash    Pt stated "throat felt funny"  . Lipitor [Atorvastatin] Other (See Comments)    myalgia  . Lovastatin     myalgias  . Statins Nausea And Vomiting  . Topamax     dizziness   . Current Outpatient Prescriptions on File Prior to Visit  Medication Sig Dispense Refill  . ALPRAZolam (XANAX) 0.5 MG tablet Take 0.5 tablets (0.25 mg total) by mouth 2 (two) times daily as needed for anxiety. 60 tablet 2  . brimonidine-timolol (COMBIGAN) 0.2-0.5 % ophthalmic solution Place 1 drop into the left eye every 12 (twelve) hours.    . cholecalciferol (VITAMIN D) 1000 UNITS tablet Take 1,000 Units by mouth daily.    Marland Kitchen ibuprofen (ADVIL,MOTRIN) 200 MG tablet Take 400 mg by mouth every 6 (six) hours as needed for fever, headache or mild pain.    . Multiple Minerals (CALCIUM-MAGNESIUM-ZINC) TABS Take 1 tablet by mouth daily.    Marland Kitchen NITROSTAT 0.4 MG SL tablet Place 1 tablet under the tongue every 15 (fifteen) minutes as needed for chest pain.     Marland Kitchen  ranitidine (ZANTAC) 75 MG tablet Take 150 mg by mouth daily as needed for heartburn.     . simvastatin (ZOCOR) 40 MG tablet Take 40 mg by mouth daily.     No current facility-administered medications on file prior to visit.   Review of Systems Constitutional: Negative for increased diaphoresis, other activity, appetite or siginficant weight change other than noted HENT: Negative for worsening hearing loss, ear pain, facial swelling, mouth sores and neck stiffness.   Eyes:  Negative for other worsening pain, redness or visual disturbance.  Respiratory: Negative for shortness of breath and wheezing  Cardiovascular: Negative for chest pain and palpitations.  Gastrointestinal: Negative for diarrhea, blood in stool, abdominal distention or other pain Genitourinary: Negative for hematuria, flank pain or change in urine volume.  Musculoskeletal: Negative for myalgias or other joint complaints.  Skin: Negative for color change and wound or drainage.  Neurological: Negative for syncope and numbness. other than noted Hematological: Negative for adenopathy. or other swelling Psychiatric/Behavioral: Negative for hallucinations, SI, self-injury, decreased concentration or other worsening agitation.      Objective:   Physical Exam BP 122/80 mmHg  Pulse 64  Temp(Src) 98.1 F (36.7 C) (Oral)  Resp 18  Ht 5\' 4"  (1.626 m)  Wt 210 lb 3.2 oz (95.346 kg)  BMI 36.06 kg/m2  SpO2 98% VS noted,  Constitutional: Pt is oriented to person, place, and time. Appears well-developed and well-nourished, in no significant distress Head: Normocephalic and atraumatic.  Right Ear: External ear normal.  Left Ear: External ear normal.  Nose: Nose normal.  Mouth/Throat: Oropharynx is clear and moist.  Eyes: Conjunctivae and EOM are normal. Pupils are equal, round, and reactive to light.  Neck: Normal range of motion. Neck supple. No JVD present. No tracheal deviation present or significant neck LA or mass Cardiovascular: Normal rate, regular rhythm, normal heart sounds and intact distal pulses.   Pulmonary/Chest: Effort normal and breath sounds without rales or wheezing  Abdominal: Soft. Bowel sounds are normal. NT. No HSM  Musculoskeletal: Normal range of motion. Exhibits no edema.  Lymphadenopathy:  Has no cervical adenopathy.  Neurological: Pt is alert and oriented to person, place, and time. Pt has normal reflexes. No cranial nerve deficit. Motor grossly intact, o/w not done in  detail Skin: Skin is warm and dry. No rash noted.  Psychiatric:  Has normal mood and affect. Behavior is normal.     Assessment & Plan:

## 2015-01-09 ENCOUNTER — Ambulatory Visit
Admission: RE | Admit: 2015-01-09 | Discharge: 2015-01-09 | Disposition: A | Discharge status: Home / Self Care | Payer: BLUE CROSS/BLUE SHIELD | Source: Ambulatory Visit | Attending: Neurology | Admitting: Neurology

## 2015-01-09 ENCOUNTER — Other Ambulatory Visit: Payer: Self-pay | Admitting: Neurology

## 2015-01-09 DIAGNOSIS — G35 Multiple sclerosis: Secondary | ICD-10-CM | POA: Diagnosis not present

## 2015-01-09 DIAGNOSIS — R9082 White matter disease, unspecified: Secondary | ICD-10-CM | POA: Diagnosis not present

## 2015-01-09 LAB — CSF CELL COUNT WITH DIFFERENTIAL
RBC Count, CSF: 1 cu mm — ABNORMAL HIGH
TUBE #: 3
WBC, CSF: 1 cu mm (ref 0–5)

## 2015-01-09 LAB — GLUCOSE, CSF: Glucose, CSF: 63 mg/dL (ref 43–76)

## 2015-01-09 LAB — PROTEIN, CSF: TOTAL PROTEIN, CSF: 34 mg/dL (ref 15–45)

## 2015-01-09 NOTE — Progress Notes (Signed)
Blood drawn to go with spinal fluid. 1 SST tube collected from right AC. Pt states her mom says she is allergic to valium, so none was given. Blood draw site is unremarkable and pt tolerated procedure fairly well.

## 2015-01-09 NOTE — Discharge Instructions (Signed)

## 2015-01-11 LAB — VDRL, CSF: SYPHILIS VDRL QUANT CSF: NONREACTIVE

## 2015-01-11 LAB — ANGIOTENSIN CONVERTING ENZYME, CSF: ACE, CSF: 12 U/L (ref <=15)

## 2015-01-16 LAB — B. BURGDORFI ANTIBODIES, CSF: Lyme Ab: NEGATIVE

## 2015-01-18 ENCOUNTER — Telehealth: Payer: Self-pay | Admitting: Neurology

## 2015-01-18 ENCOUNTER — Telehealth: Payer: Self-pay | Admitting: Internal Medicine

## 2015-01-18 LAB — OLIGOCLONAL BANDS, CSF + SERM

## 2015-01-18 NOTE — Telephone Encounter (Signed)
I called the patient. Lumbar puncture evaluation was unremarkable, no oligoclonal banding. Diagnosis of multiple sclerosis cannot be confirmed. The patient has mild abnormalities by MRI of the brain, and the visual response test is abnormal. If the patient is having new ongoing symptoms, we'll consider use of one of the platform medications for multiple sclerosis. The patient will be seen back in July, but if she is not doing well, we can see her earlier.

## 2015-01-25 NOTE — Telephone Encounter (Signed)
Close encounter 

## 2015-02-01 ENCOUNTER — Telehealth: Payer: Self-pay | Admitting: Internal Medicine

## 2015-02-01 ENCOUNTER — Encounter: Payer: Self-pay | Admitting: Internal Medicine

## 2015-02-02 NOTE — Telephone Encounter (Signed)
Close encounter 

## 2015-03-06 DIAGNOSIS — Z01419 Encounter for gynecological examination (general) (routine) without abnormal findings: Secondary | ICD-10-CM | POA: Diagnosis not present

## 2015-03-06 DIAGNOSIS — Z1389 Encounter for screening for other disorder: Secondary | ICD-10-CM | POA: Diagnosis not present

## 2015-03-06 DIAGNOSIS — Z130 Encounter for screening for diseases of the blood and blood-forming organs and certain disorders involving the immune mechanism: Secondary | ICD-10-CM | POA: Diagnosis not present

## 2015-03-08 DIAGNOSIS — H4011X2 Primary open-angle glaucoma, moderate stage: Secondary | ICD-10-CM | POA: Diagnosis not present

## 2015-03-27 ENCOUNTER — Ambulatory Visit (INDEPENDENT_AMBULATORY_CARE_PROVIDER_SITE_OTHER): Payer: BLUE CROSS/BLUE SHIELD | Admitting: Neurology

## 2015-03-27 ENCOUNTER — Encounter: Payer: Self-pay | Admitting: Neurology

## 2015-03-27 VITALS — BP 102/69 | HR 67 | Ht 64.0 in | Wt 218.0 lb

## 2015-03-27 DIAGNOSIS — G35 Multiple sclerosis: Secondary | ICD-10-CM | POA: Diagnosis not present

## 2015-03-27 DIAGNOSIS — G43009 Migraine without aura, not intractable, without status migrainosus: Secondary | ICD-10-CM

## 2015-03-27 MED ORDER — SUMATRIPTAN SUCCINATE 100 MG PO TABS: 100.0000 mg | ORAL_TABLET | Freq: Two times a day (BID) | ORAL | Status: DC | PRN

## 2015-03-27 MED ORDER — PROPRANOLOL HCL 10 MG PO TABS: ORAL_TABLET | ORAL | Status: DC

## 2015-03-27 NOTE — Patient Instructions (Signed)

## 2015-03-27 NOTE — Progress Notes (Signed)
Reason for visit: Migraine headache  [name] is an 49 y.o. female  History of present illness:  [name] is a 49 year old right-handed black female with a history of migraine headaches since age 27. The patient has had chronic undulating sensory symptoms with paresthesias of all 4 extremities that come and go. The patient has intermittent squeezing sensations around the body. She has had MRI evaluation of the brain that shows very minimal white matter changes that could be consistent with the history of migraine. There is some question of demyelinating disease. The patient was set up for a visual evoked response test, she had conduction slowing on the left, normal on the right. She does have a history of glaucoma, she takes eye drops for this. The patient indicates that the left side of her head is quite sensitive, she cannot sleep on the left side as it induces pain and discomfort around the left ear that may last several days. She indicates that 15 or 16 days of the month she has migraine headache. When the headache does occur, it generally will last about 2 days. She has been on Topamax before, she could not tolerate this secondary to dizziness and gait instability. She has never been on other prophylactic medications for migraine. The patient has undergone MRI evaluation of the cervical and thoracic spine which did not show any cord abnormalities. Lumbar puncture was unremarkable. The diagnosis of multiple sclerosis has been entertained, but this is in question. MRI evaluations of the brain have been stable over greater than 15 years. The patient indicates that with her headaches, she will have photophobia, phonophobia, some occasional nausea. The patient indicates that bright sunlight and he will bring on headache. She returns for an evaluation.  Past Medical History  Diagnosis Date  . HYPERLIPIDEMIA 03/27/2010  . ANXIETY 03/27/2010  . COMMON MIGRAINE 03/27/2010  . GERD 03/27/2010  .  [place] DISEASE, CERVICAL 03/27/2010  . BACK PAIN, CHRONIC 03/27/2010  . FATIGUE 03/27/2010  . Unspecified vitamin D deficiency 10/23/2010  . Multiple sclerosis 10/23/2010    unconfirmed, but suspected  . Osteoporosis 09/24/2012  . Glaucoma     bilateral    Past Surgical History  Procedure Laterality Date  . Abdominal hysterectomy    . S/p knee surgury    . S/p eye surgury    . Neg stress test  2007  . Left heart catheterization with coronary angiogram N/A 03/31/2014    Procedure: LEFT HEART CATHETERIZATION WITH CORONARY ANGIOGRAM;  Surgeon: Pixie Casino, MD;  Location: [facility] CATH LAB;  Service: Cardiovascular;  Laterality: N/A;    Family History  Problem Relation Age of Onset  . Heart disease Father   . Diabetes Father   . Hyperlipidemia Father   . Hypertension Father   . Diabetes Other   . Arthritis Other   . Dementia Other   . Cancer Other     2 uncles with lung cancer  . Heart disease Other     2 uncles with heart disease  . Cancer Mother     breast  . Hyperlipidemia Mother   . Lupus Maternal Aunt   . Lupus Cousin     mother and father's side    Social history:  reports that she has never smoked. She has never used smokeless tobacco. She reports that she does not drink alcohol or use illicit drugs.    Allergies  Allergen Reactions  . Augmentin [Amoxicillin-Pot Clavulanate] Rash and Other (See Comments)  Pt stated "throat felt funny"  . Lipitor [Atorvastatin] Other (See Comments)    myalgia  . Lovastatin Other (See Comments)    myalgias  . Statins Nausea And Vomiting  . Topamax Other (See Comments)    dizziness    Medications:  Prior to Admission medications   Medication Sig Start Date End Date Taking? Authorizing Provider  cholecalciferol (VITAMIN D) 1000 UNITS tablet Take 1,000 Units by mouth daily.   Yes Historical Provider, MD  cyclobenzaprine (FLEXERIL) 5 MG tablet Take 1 tablet (5 mg total) by mouth 3 (three) times daily as needed for muscle spasms.  01/03/15  Yes Biagio Borg, MD  ibuprofen (ADVIL,MOTRIN) 200 MG tablet Take 400 mg by mouth every 6 (six) hours as needed for fever, headache or mild pain.   Yes Historical Provider, MD  Multiple Minerals (CALCIUM-MAGNESIUM-ZINC) TABS Take 1 tablet by mouth daily.   Yes Historical Provider, MD  NITROSTAT 0.4 MG SL tablet Place 1 tablet under the tongue every 15 (fifteen) minutes as needed for chest pain.  12/20/13  Yes Historical Provider, MD  ranitidine (ZANTAC) 75 MG tablet Take 150 mg by mouth daily as needed for heartburn.    Yes Historical Provider, MD  simvastatin (ZOCOR) 40 MG tablet Take 1 tablet (40 mg total) by mouth daily. 01/03/15  Yes Biagio Borg, MD    ROS:  Out of a complete 14 system review of symptoms, the patient complains only of the following symptoms, and all other reviewed systems are negative.  Fatigue Double vision, monocular, left eye Wheezing, shortness of breath Palpitations of the heart Dizziness, headache, numbness, weakness Muscle cramps, neck stiffness  Blood pressure 102/69, pulse 67, height 5\' 4"  (1.626 m), weight 218 lb (98.884 kg).  Physical Exam  General: The patient is alert and cooperative at the time of the examination.  Skin: No significant peripheral edema is noted.   Neurologic Exam  Mental status: The patient is alert and oriented x 3 at the time of the examination. The patient has apparent normal recent and remote memory, with an apparently normal attention span and concentration ability.   Cranial nerves: Facial symmetry is present. Speech is normal, no aphasia or dysarthria is noted. Extraocular movements are full. Visual fields are full. Pupils are equal, round, and reactive to light. Discs are flat bilaterally. Discs are oblong in shape, appear to be pale bilaterally.  Motor: The patient has good strength in all 4 extremities.  Sensory examination: Soft touch sensation is symmetric on the face, arms, and legs.  Coordination: The  patient has good finger-nose-finger and heel-to-shin bilaterally.  Gait and station: The patient has a normal gait. Tandem gait is normal. Romberg is negative. No drift is seen.  Reflexes: Deep tendon reflexes are symmetric.   MRI cervical 12/27/14:  IMPRESSION: This is an abnormal MRI of the cervical spine with and without contrast showed the following: 1. Mild degenerative changes at C3-C4 with disc bulging, C4-C5 with uncovertebral spurring and disc bulging and at C5-C6 with disc bulging. There is no significant foraminal narrowing and no nerve root impingement. The changes at C4-C5 have progressed since the MRI dated 06/20/2004 with uncovertebral spurring noted on the current study. 2. The spinal cord appears normal. This is unchanged when compared to the prior MRI.   MRI throracic 12/27/14:  IMPRESSION: Is a mildly abnormal MRI of the thoracic spine with and without contrast showing minimal degenerative changes at T7-T8, T11-T12 and T12-L1 with small disc bulges. The spinal cord  appears normal. There are no acute findings.  * MRI scan images were reviewed online. I agree with the written report.    MRI brain 11/22/14:  IMPRESSION: No acute or subacute infarction.  Scattered foci of abnormal T2 signal in the cerebral hemispheric white matter raising the possibility of multiple sclerosis. See above discussion.   Assessment/Plan:  1. Abnormal MRI brain  2. Migraine headache  The diagnosis of multiple sclerosis remains in question. She does have abnormal findings on the visual evoked response test on the left, but no abnormalities are seen on the cervical or thoracic spinal cord, and lumbar puncture was unremarkable. The MRI findings of the brain could be very consistent with a history of migraine headache. The migraine remains a significant issue for her, she is having 15 or 16 headache days a month. The patient will be placed on propranolol, and she will be given Imitrex  to take for the headache if needed. The patient will contact me if she is not tolerating the medication or she needs a higher dose to manage the headache. She will otherwise follow-up in about 4 months.  Jill Alexanders MD 03/27/2015 8:13 PM  [facility] [address] [place], [place] [address]  Phone [phone] Fax [phone]

## 2015-04-16 ENCOUNTER — Ambulatory Visit: Payer: BLUE CROSS/BLUE SHIELD | Admitting: Internal Medicine

## 2015-04-20 ENCOUNTER — Ambulatory Visit (INDEPENDENT_AMBULATORY_CARE_PROVIDER_SITE_OTHER): Payer: BLUE CROSS/BLUE SHIELD | Admitting: Internal Medicine

## 2015-04-20 ENCOUNTER — Encounter: Payer: Self-pay | Admitting: Internal Medicine

## 2015-04-20 VITALS — BP 126/62 | HR 66 | Ht 64.0 in | Wt 214.9 lb

## 2015-04-20 DIAGNOSIS — M791 Myalgia, unspecified site: Secondary | ICD-10-CM

## 2015-04-20 DIAGNOSIS — R202 Paresthesia of skin: Secondary | ICD-10-CM

## 2015-04-20 DIAGNOSIS — R0789 Other chest pain: Secondary | ICD-10-CM | POA: Diagnosis not present

## 2015-04-20 NOTE — Patient Instructions (Signed)
Dr Hilty recommends that you follow-up with him as needed. 

## 2015-04-20 NOTE — Progress Notes (Signed)
OFFICE NOTE  Chief Complaint:  Mild chest discomfort with exertion  Primary Care Physician: Cathlean Cower, MD  HPI:  [name] is a pleasant 49 year old female who has been suffering with symptoms of chest pain and palpitations for quite some time. Apparently in 2008 she had an episode of chest pain and was admitted to the hospital. She was treated and ruled out and continued to have very intermittent episodes of discomfort over the years. Recently she had some worsening symptoms including chest discomfort and feeling of palpitations. This is followed by significant fatigue and lack of energy for several days after the palpitations have resolved. It is associated with heaviness in the chest that is behind the sternum. It does not radiate to the arm or to the jaw. Sometimes the symptoms are worse when she is exerting herself and improve at rest. Other times they occur at rest. She had recently seen Dr. Terrence Dupont, for a cardiac opinion. He ordered a stress test which was performed at [facility]. 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 No evidence for inducible ischemia. Normal function with an EF of 66%".  This was interpreted by a Development worker, community.  Apparently she received a call saying her stress test was negative and she was told to followup with him in the office in 3 weeks. When she came back she was allegedly told that her stress test showed a prior heart attack. There does however appear to be conflicting data. Based on this she is seeking another opinion, especially in light of recurrent symptoms. She does report that the palpitation episodes are actually fairly infrequent. She says she's had about 3 episodes of this year and perhaps 2 or 3 episodes last year.  [name] returns today for follow-up. After undergoing noninvasive testing  which was equivocal, there was still some concern for possible coronary ischemia. She underwent definitive cardiac catheterization by myself which demonstrated normal coronary arteries. Based on these findings I'm not suspicious that her chest pain is cardiac. Additionally, she has few risk factors for variant angina or small vessel disease. She does have migraine headaches which could put her at risk for coronary spasm. Treatments for possible coronary spasm include beta blockers, calcium channel blockers or nitrates. She seems to have had problems with low blood pressure in the past and I did not think will tolerate these medicines. She was started on low-dose propranolol for migraines and she said she only took 1 pill and felt horrible with it. Therefore, I do not feel she'll tolerate a beta blocker. Her discomfort in the chest is not lifestyle or activity limiting.  PMHx:  Past Medical History  Diagnosis Date  . HYPERLIPIDEMIA 03/27/2010  . ANXIETY 03/27/2010  . COMMON MIGRAINE 03/27/2010  . GERD 03/27/2010  . [place] DISEASE, CERVICAL 03/27/2010  . BACK PAIN, CHRONIC 03/27/2010  . FATIGUE 03/27/2010  . Unspecified vitamin D deficiency 10/23/2010  . Multiple sclerosis 10/23/2010    unconfirmed, but suspected  . Osteoporosis 09/24/2012  . Glaucoma     bilateral    Past Surgical History  Procedure Laterality Date  . Abdominal hysterectomy    . S/p knee surgury    . S/p eye surgury    . Neg stress test  2007  . Left heart catheterization with coronary angiogram N/A 03/31/2014    Procedure: LEFT HEART CATHETERIZATION WITH  CORONARY ANGIOGRAM;  Surgeon: Pixie Casino, MD;  Location: [facility] CATH LAB;  Service: Cardiovascular;  Laterality: N/A;    FAMHx:  Family History  Problem Relation Age of Onset  . Heart disease Father   . Diabetes Father   . Hyperlipidemia Father   . Hypertension Father   . Diabetes Other   . Arthritis Other   . Dementia Other   . Cancer Other     2 uncles with lung  cancer  . Heart disease Other     2 uncles with heart disease  . Cancer Mother     breast  . Hyperlipidemia Mother   . Lupus Maternal Aunt   . Lupus Cousin     mother and father's side    SOCHx:   reports that she has never smoked. She has never used smokeless tobacco. She reports that she does not drink alcohol or use illicit drugs.  ALLERGIES:  Allergies  Allergen Reactions  . Augmentin [Amoxicillin-Pot Clavulanate] Rash and Other (See Comments)    Pt stated "throat felt funny"  . Lipitor [Atorvastatin] Other (See Comments)    myalgia  . Lovastatin Other (See Comments)    myalgias  . Statins Nausea And Vomiting  . Topamax Other (See Comments)    dizziness    ROS: A comprehensive review of systems was negative except for: Constitutional: positive for fatigue Respiratory: positive for dyspnea on exertion Cardiovascular: positive for chest pain and palpitations Behavioral/Psych: positive for anxiety  HOME MEDS: Current Outpatient Prescriptions  Medication Sig Dispense Refill  . brimonidine-timolol (COMBIGAN) 0.2-0.5 % ophthalmic solution Place 1 drop into both eyes every 12 (twelve) hours.    . cholecalciferol (VITAMIN D) 1000 UNITS tablet Take 1,000 Units by mouth daily.    . cyclobenzaprine (FLEXERIL) 5 MG tablet Take 1 tablet (5 mg total) by mouth 3 (three) times daily as needed for muscle spasms. 60 tablet 2  . ibuprofen (ADVIL,MOTRIN) 200 MG tablet Take 400 mg by mouth every 6 (six) hours as needed for fever, headache or mild pain.    . Multiple Minerals (CALCIUM-MAGNESIUM-ZINC) TABS Take 1 tablet by mouth daily.    Marland Kitchen NITROSTAT 0.4 MG SL tablet Place 1 tablet under the tongue every 15 (fifteen) minutes as needed for chest pain.     Marland Kitchen propranolol (INDERAL) 10 MG tablet One tablet twice a day for 2 weeks, then take 2 tablets twice a day 120 tablet 2  . ranitidine (ZANTAC) 75 MG tablet Take 150 mg by mouth daily as needed for heartburn.     . simvastatin (ZOCOR) 40 MG  tablet Take 1 tablet (40 mg total) by mouth daily. 90 tablet 3  . SUMAtriptan (IMITREX) 100 MG tablet Take 1 tablet (100 mg total) by mouth 2 (two) times daily as needed for migraine. 10 tablet 2   No current facility-administered medications for this visit.    LABS/IMAGING: No results found for this or any previous visit (from the past 48 hour(s)). No results found.  VITALS: BP 126/62 mmHg  Pulse 66  Ht 5\' 4"  (1.626 m)  Wt 214 lb 14.4 oz (97.478 kg)  BMI 36.87 kg/m2  EXAM: General appearance: alert and no distress Neck: no carotid bruit, no JVD and thyroid not enlarged, symmetric, no tenderness/mass/nodules Lungs: clear to auscultation bilaterally Heart: regular rate and rhythm, S1, S2 normal, no murmur, click, rub or gallop Abdomen: soft, non-tender; bowel sounds normal; no masses,  no organomegaly Extremities: extremities normal, atraumatic, no cyanosis or edema Pulses:  2+ and symmetric Skin: Skin color, texture, turgor normal. No rashes or lesions Neurologic: Grossly normal Psych: Mood, affect normal  EKG: Normal sinus rhythm at 66  ASSESSMENT: 1. Persistent chest pain and palpitations - normal coronary arteries by catheterization 2. Possible history of multiple sclerosis 3. Dyslipidemia  PLAN: 1.   [name] is still having some mild discomfort in the chest which she says is not a pain. It sees to be tolerated and she can still do activities. Her coronary arteries were normal on catheterization I did not think this represents any small vessel coronary disease. She could be at risk for spasm however it does not seem that she'll tolerate medications due to side effects and a history of low blood pressure. I recommend conservative treatment and can see her back on an as-needed basis if her symptoms worsen.  Pixie Casino, MD, [facility] Attending Cardiologist [place] 04/20/2015, 1:33 PM

## 2015-05-08 ENCOUNTER — Emergency Department (HOSPITAL_COMMUNITY)
Admission: EM | Admit: 2015-05-08 | Discharge: 2015-05-08 | Disposition: A | Discharge status: Home / Self Care | Payer: BLUE CROSS/BLUE SHIELD | Attending: Emergency Medicine | Admitting: Emergency Medicine

## 2015-05-08 ENCOUNTER — Emergency Department (HOSPITAL_COMMUNITY): Payer: BLUE CROSS/BLUE SHIELD

## 2015-05-08 ENCOUNTER — Encounter (HOSPITAL_COMMUNITY): Payer: Self-pay | Admitting: Emergency Medicine

## 2015-05-08 DIAGNOSIS — G43009 Migraine without aura, not intractable, without status migrainosus: Secondary | ICD-10-CM | POA: Insufficient documentation

## 2015-05-08 DIAGNOSIS — M810 Age-related osteoporosis without current pathological fracture: Secondary | ICD-10-CM | POA: Diagnosis not present

## 2015-05-08 DIAGNOSIS — E785 Hyperlipidemia, unspecified: Secondary | ICD-10-CM | POA: Diagnosis not present

## 2015-05-08 DIAGNOSIS — E559 Vitamin D deficiency, unspecified: Secondary | ICD-10-CM | POA: Insufficient documentation

## 2015-05-08 DIAGNOSIS — Z79899 Other long term (current) drug therapy: Secondary | ICD-10-CM | POA: Diagnosis not present

## 2015-05-08 DIAGNOSIS — R102 Pelvic and perineal pain: Secondary | ICD-10-CM | POA: Diagnosis present

## 2015-05-08 DIAGNOSIS — G8929 Other chronic pain: Secondary | ICD-10-CM | POA: Insufficient documentation

## 2015-05-08 DIAGNOSIS — F419 Anxiety disorder, unspecified: Secondary | ICD-10-CM | POA: Insufficient documentation

## 2015-05-08 DIAGNOSIS — H409 Unspecified glaucoma: Secondary | ICD-10-CM | POA: Insufficient documentation

## 2015-05-08 DIAGNOSIS — A599 Trichomoniasis, unspecified: Secondary | ICD-10-CM

## 2015-05-08 DIAGNOSIS — A5901 Trichomonal vulvovaginitis: Secondary | ICD-10-CM | POA: Insufficient documentation

## 2015-05-08 DIAGNOSIS — K219 Gastro-esophageal reflux disease without esophagitis: Secondary | ICD-10-CM | POA: Diagnosis not present

## 2015-05-08 LAB — CBC WITH DIFFERENTIAL/PLATELET
BASOS PCT: 0 % (ref 0–1)
Basophils Absolute: 0 10*3/uL (ref 0.0–0.1)
Eosinophils Absolute: 0.1 10*3/uL (ref 0.0–0.7)
Eosinophils Relative: 1 % (ref 0–5)
HEMATOCRIT: 39.4 % (ref 36.0–46.0)
HEMOGLOBIN: 13 g/dL (ref 12.0–15.0)
Lymphocytes Relative: 39 % (ref 12–46)
Lymphs Abs: 2.6 10*3/uL (ref 0.7–4.0)
MCH: 30.8 pg (ref 26.0–34.0)
MCHC: 33 g/dL (ref 30.0–36.0)
MCV: 93.4 fL (ref 78.0–100.0)
MONOS PCT: 6 % (ref 3–12)
Monocytes Absolute: 0.4 10*3/uL (ref 0.1–1.0)
NEUTROS ABS: 3.5 10*3/uL (ref 1.7–7.7)
NEUTROS PCT: 54 % (ref 43–77)
Platelets: 261 10*3/uL (ref 150–400)
RBC: 4.22 MIL/uL (ref 3.87–5.11)
RDW: 13.8 % (ref 11.5–15.5)
WBC: 6.6 10*3/uL (ref 4.0–10.5)

## 2015-05-08 LAB — WET PREP, GENITAL
Clue Cells Wet Prep HPF POC: NONE SEEN
Yeast Wet Prep HPF POC: NONE SEEN

## 2015-05-08 LAB — COMPREHENSIVE METABOLIC PANEL
ALBUMIN: 3.6 g/dL (ref 3.5–5.0)
ALK PHOS: 73 U/L (ref 38–126)
ALT: 19 U/L (ref 14–54)
AST: 21 U/L (ref 15–41)
Anion gap: 6 (ref 5–15)
BILIRUBIN TOTAL: 0.8 mg/dL (ref 0.3–1.2)
BUN: 8 mg/dL (ref 6–20)
CALCIUM: 8.9 mg/dL (ref 8.9–10.3)
CO2: 27 mmol/L (ref 22–32)
CREATININE: 0.55 mg/dL (ref 0.44–1.00)
Chloride: 104 mmol/L (ref 101–111)
GFR calc Af Amer: >60 mL/min (ref >60)
GFR calc non Af Amer: >60 mL/min (ref >60)
GLUCOSE: 90 mg/dL (ref 65–99)
Potassium: 3.9 mmol/L (ref 3.5–5.1)
Sodium: 137 mmol/L (ref 135–145)
TOTAL PROTEIN: 7.3 g/dL (ref 6.5–8.1)

## 2015-05-08 LAB — URINALYSIS, ROUTINE W REFLEX MICROSCOPIC
BILIRUBIN URINE: NEGATIVE
GLUCOSE, UA: NEGATIVE mg/dL
Hgb urine dipstick: NEGATIVE
KETONES UR: NEGATIVE mg/dL
Nitrite: NEGATIVE
PH: 7.5 (ref 5.0–8.0)
Protein, ur: NEGATIVE mg/dL
SPECIFIC GRAVITY, URINE: 1.007 (ref 1.005–1.030)
Urobilinogen, UA: 0.2 mg/dL (ref 0.0–1.0)

## 2015-05-08 LAB — URINE MICROSCOPIC-ADD ON

## 2015-05-08 MED ORDER — LIDOCAINE HCL (PF) 1 % IJ SOLN
INTRAMUSCULAR | Status: AC
  Administered 2015-05-08: 1.2 mL
  Filled 2015-05-08: qty 5

## 2015-05-08 MED ORDER — METRONIDAZOLE 500 MG PO TABS
2000.0000 mg | ORAL_TABLET | Freq: Once | ORAL | Status: AC
  Administered 2015-05-08: 2000 mg via ORAL
  Filled 2015-05-08 (×2): qty 4

## 2015-05-08 MED ORDER — METRONIDAZOLE 500 MG PO TABS: 500.0000 mg | ORAL_TABLET | Freq: Two times a day (BID) | ORAL | Status: DC

## 2015-05-08 MED ORDER — OXYCODONE-ACETAMINOPHEN 5-325 MG PO TABS
2.0000 | ORAL_TABLET | Freq: Once | ORAL | Status: AC
  Administered 2015-05-08: 2 via ORAL
  Filled 2015-05-08: qty 2

## 2015-05-08 MED ORDER — CEFTRIAXONE SODIUM 250 MG IJ SOLR
250.0000 mg | Freq: Once | INTRAMUSCULAR | Status: AC
  Administered 2015-05-08: 250 mg via INTRAMUSCULAR
  Filled 2015-05-08: qty 250

## 2015-05-08 MED ORDER — DOXYCYCLINE HYCLATE 100 MG PO CAPS: 100.0000 mg | ORAL_CAPSULE | Freq: Two times a day (BID) | ORAL | Status: DC

## 2015-05-08 MED ORDER — AZITHROMYCIN 250 MG PO TABS
1000.0000 mg | ORAL_TABLET | Freq: Once | ORAL | Status: AC
  Administered 2015-05-08: 1000 mg via ORAL
  Filled 2015-05-08: qty 4

## 2015-05-08 NOTE — ED Notes (Addendum)
Pt A+ox4, reports pelvic and rectal pain, worse with sitting/movement, worse with eating.  +nausea.  Pt reports "had this with my menstrual cycle" but reports no cycle x11 years s/p hysterectomy.  Pt appears uncomfortable.  MAEI, ambulatory with steady gait.  Skin PWD.  Speaking full/clear sentences, rr even/un-lab.  NAD.

## 2015-05-08 NOTE — ED Provider Notes (Signed)
CSN: [id number]     Arrival date & time 05/08/15  1146 History   First MD Initiated Contact with Patient 05/08/15 1208     Chief Complaint  Patient presents with  . Pelvic Pain    x3 days  . Rectal Pain    xweeks intermittently     (Consider location/radiation/quality/duration/timing/severity/associated sxs/prior Treatment) Patient is a 49 y.o. female presenting with pelvic pain. The history is provided by the patient and medical records.  Pelvic Pain   This is a 49 y.o. F with hx of hyperlipidemia, anxiety, migraine headaches, GERD, glaucoma, MS, presenting to the ED for pelvic pain. Patient states pain has been intermittent for the past 3 weeks, usually lasting a few hours at this time. She states pain is localized to her right and left groin which she describes as a "burning sensation".  States pain becomes intense, thinks she is becoming nauseated due to the pain.  She has no eaten thus far today.  She denies any dysuria, urinary frequency, or vaginal discharge. Bowel movements have been normal, no melena or hematochezia. Patient equates her pain to how her menstrual cycles used to feel, however she had a hysterectomy approximately 11 years ago. She does still have both ovaries.  She denies history of ovarian cysts in the past. She last saw her OB/GYN, Dr. Verl Blalock, 2 months ago, exam was normal at that time.  No fever or chills.  VSS.  Past Medical History  Diagnosis Date  . HYPERLIPIDEMIA 03/27/2010  . ANXIETY 03/27/2010  . COMMON MIGRAINE 03/27/2010  . GERD 03/27/2010  . [place] DISEASE, CERVICAL 03/27/2010  . BACK PAIN, CHRONIC 03/27/2010  . FATIGUE 03/27/2010  . Unspecified vitamin D deficiency 10/23/2010  . Multiple sclerosis 10/23/2010    unconfirmed, but suspected  . Osteoporosis 09/24/2012  . Glaucoma     bilateral   Past Surgical History  Procedure Laterality Date  . Abdominal hysterectomy    . S/p knee surgury    . S/p eye surgury    . Neg stress test  2007  . Left heart  catheterization with coronary angiogram N/A 03/31/2014    Procedure: LEFT HEART CATHETERIZATION WITH CORONARY ANGIOGRAM;  Surgeon: Pixie Casino, MD;  Location: [facility] CATH LAB;  Service: Cardiovascular;  Laterality: N/A;   Family History  Problem Relation Age of Onset  . Heart disease Father   . Diabetes Father   . Hyperlipidemia Father   . Hypertension Father   . Diabetes Other   . Arthritis Other   . Dementia Other   . Cancer Other     2 uncles with lung cancer  . Heart disease Other     2 uncles with heart disease  . Cancer Mother     breast  . Hyperlipidemia Mother   . Lupus Maternal Aunt   . Lupus Cousin     mother and father's side   Social History  Substance Use Topics  . Smoking status: Never Smoker   . Smokeless tobacco: Never Used  . Alcohol Use: No   OB History    No data available     Review of Systems  Genitourinary: Positive for pelvic pain.  All other systems reviewed and are negative.     Allergies  Augmentin; Lipitor; Lovastatin; Statins; and Topamax  Home Medications   Prior to Admission medications   Medication Sig Start Date End Date Taking? Authorizing Provider  brimonidine-timolol (COMBIGAN) 0.2-0.5 % ophthalmic solution Place 1 drop into both eyes every 12 (twelve)  hours.   Yes Historical Provider, MD  cholecalciferol (VITAMIN D) 1000 UNITS tablet Take 1,000 Units by mouth daily.   Yes Historical Provider, MD  cyclobenzaprine (FLEXERIL) 5 MG tablet Take 1 tablet (5 mg total) by mouth 3 (three) times daily as needed for muscle spasms. 01/03/15  Yes Biagio Borg, MD  ibuprofen (ADVIL,MOTRIN) 200 MG tablet Take 400 mg by mouth every 6 (six) hours as needed for fever, headache or mild pain.   Yes Historical Provider, MD  NITROSTAT 0.4 MG SL tablet Place 1 tablet under the tongue every 15 (fifteen) minutes as needed for chest pain.  12/20/13  Yes Historical Provider, MD  psyllium (METAMUCIL SMOOTH TEXTURE) 28 % packet Take 1 packet by mouth 2 (two)  times daily.   Yes Historical Provider, MD  ranitidine (ZANTAC) 75 MG tablet Take 150 mg by mouth daily as needed for heartburn.    Yes Historical Provider, MD  simvastatin (ZOCOR) 40 MG tablet Take 1 tablet (40 mg total) by mouth daily. 01/03/15  Yes Biagio Borg, MD  SUMAtriptan (IMITREX) 100 MG tablet Take 1 tablet (100 mg total) by mouth 2 (two) times daily as needed for migraine. 03/27/15  Yes Kathrynn Ducking, MD  propranolol (INDERAL) 10 MG tablet One tablet twice a day for 2 weeks, then take 2 tablets twice a day Patient not taking: Reported on 05/08/2015 03/27/15   Kathrynn Ducking, MD   BP 120/69 mmHg  Pulse 89  Temp(Src) 97.7 F (36.5 C) (Oral)  Resp 20  SpO2 99%   Physical Exam  Constitutional: She is oriented to person, place, and time. She appears well-developed and well-nourished. No distress.  HENT:  Head: Normocephalic and atraumatic.  Mouth/Throat: Oropharynx is clear and moist.  Eyes: Conjunctivae and EOM are normal. Pupils are equal, round, and reactive to light.  Neck: Normal range of motion. Neck supple.  Cardiovascular: Normal rate, regular rhythm and normal heart sounds.   Pulmonary/Chest: Effort normal and breath sounds normal. No respiratory distress. She has no wheezes.  Abdominal: Soft. Bowel sounds are normal. There is no tenderness. There is no guarding and no CVA tenderness.  No focal abdominal tenderness or peritoneal signs  Genitourinary: Rectum normal. There is no lesion on the right labia. There is no lesion on the left labia. Cervix exhibits no motion tenderness. Right adnexum displays tenderness. Left adnexum displays tenderness. No bleeding in the vagina. No foreign body around the vagina. No vaginal discharge found.  Normal female external genitalia without visible lesions; no vaginal discharge, bleeding, or FB noted; bilateral adnexal tenderness, R > L; uterus surgically absent  Musculoskeletal: Normal range of motion.  Neurological: She is alert and  oriented to person, place, and time.  Skin: Skin is warm and dry. She is not diaphoretic.  Psychiatric: She has a normal mood and affect.  Nursing note and vitals reviewed.   ED Course  Procedures (including critical care time) Labs Review Labs Reviewed  WET PREP, GENITAL - Abnormal; Notable for the following:    Trich, Wet Prep FEW (*)    WBC, Wet Prep HPF POC FEW (*)    All other components within normal limits  URINALYSIS, ROUTINE W REFLEX MICROSCOPIC (NOT AT [facility]) - Abnormal; Notable for the following:    Leukocytes, UA SMALL (*)    All other components within normal limits  URINE MICROSCOPIC-ADD ON - Abnormal; Notable for the following:    Squamous Epithelial / LPF MANY (*)    All other  components within normal limits  CBC WITH DIFFERENTIAL/PLATELET  COMPREHENSIVE METABOLIC PANEL  GC/CHLAMYDIA PROBE AMP ([place]) NOT AT [facility]    Imaging Review US Transvaginal Non-ob  05/08/2015   CLINICAL DATA:  Bilateral pelvic pain for 2 weeks. Prior hysterectomy.  EXAM: TRANSABDOMINAL AND TRANSVAGINAL ULTRASOUND OF PELVIS  DOPPLER ULTRASOUND OF OVARIES  TECHNIQUE: Both transabdominal and transvaginal ultrasound examinations of the pelvis were performed. Transabdominal technique was performed for global imaging of the pelvis including uterus, ovaries, adnexal regions, and pelvic cul-de-sac.  It was necessary to proceed with endovaginal exam following the transabdominal exam to visualize the uterus, endometrium, ovaries and adnexa . Color and duplex Doppler ultrasound was utilized to evaluate blood flow to the ovaries.  COMPARISON:  CT 05/16/2009  FINDINGS: Uterus  Measurements: Prior hysterectomy.  Endometrium  Thickness: N/A.  Right ovary  Measurements: 2.5 x 1.3 x 1.8 cm. Normal appearance/no adnexal mass.  Left ovary  Measurements: 2.6 x 1.4 x 1.4 cm. Small follicle. No adnexal masses.  Pulsed Doppler evaluation of both ovaries demonstrates normal low-resistance arterial and venous waveforms.   Other findings  Trace free fluid in the pelvis.  IMPRESSION: No acute findings.  Prior hysterectomy.   Electronically Signed   By: Rolm Baptise M.D.   On: 05/08/2015 14:25   US Pelvis Complete  05/08/2015   CLINICAL DATA:  Bilateral pelvic pain for 2 weeks. Prior hysterectomy.  EXAM: TRANSABDOMINAL AND TRANSVAGINAL ULTRASOUND OF PELVIS  DOPPLER ULTRASOUND OF OVARIES  TECHNIQUE: Both transabdominal and transvaginal ultrasound examinations of the pelvis were performed. Transabdominal technique was performed for global imaging of the pelvis including uterus, ovaries, adnexal regions, and pelvic cul-de-sac.  It was necessary to proceed with endovaginal exam following the transabdominal exam to visualize the uterus, endometrium, ovaries and adnexa . Color and duplex Doppler ultrasound was utilized to evaluate blood flow to the ovaries.  COMPARISON:  CT 05/16/2009  FINDINGS: Uterus  Measurements: Prior hysterectomy.  Endometrium  Thickness: N/A.  Right ovary  Measurements: 2.5 x 1.3 x 1.8 cm. Normal appearance/no adnexal mass.  Left ovary  Measurements: 2.6 x 1.4 x 1.4 cm. Small follicle. No adnexal masses.  Pulsed Doppler evaluation of both ovaries demonstrates normal low-resistance arterial and venous waveforms.  Other findings  Trace free fluid in the pelvis.  IMPRESSION: No acute findings.  Prior hysterectomy.   Electronically Signed   By: Rolm Baptise M.D.   On: 05/08/2015 14:25   [place] Art/ven Flow Abd Pelv Doppler  05/08/2015   CLINICAL DATA:  Bilateral pelvic pain for 2 weeks. Prior hysterectomy.  EXAM: TRANSABDOMINAL AND TRANSVAGINAL ULTRASOUND OF PELVIS  DOPPLER ULTRASOUND OF OVARIES  TECHNIQUE: Both transabdominal and transvaginal ultrasound examinations of the pelvis were performed. Transabdominal technique was performed for global imaging of the pelvis including uterus, ovaries, adnexal regions, and pelvic cul-de-sac.  It was necessary to proceed with endovaginal exam following the transabdominal exam to  visualize the uterus, endometrium, ovaries and adnexa . Color and duplex Doppler ultrasound was utilized to evaluate blood flow to the ovaries.  COMPARISON:  CT 05/16/2009  FINDINGS: Uterus  Measurements: Prior hysterectomy.  Endometrium  Thickness: N/A.  Right ovary  Measurements: 2.5 x 1.3 x 1.8 cm. Normal appearance/no adnexal mass.  Left ovary  Measurements: 2.6 x 1.4 x 1.4 cm. Small follicle. No adnexal masses.  Pulsed Doppler evaluation of both ovaries demonstrates normal low-resistance arterial and venous waveforms.  Other findings  Trace free fluid in the pelvis.  IMPRESSION: No acute findings.  Prior  hysterectomy.   Electronically Signed   By: Rolm Baptise M.D.   On: 05/08/2015 14:25   I have personally reviewed and evaluated these images and lab results as part of my medical decision-making.   EKG Interpretation None      MDM   Final diagnoses:  Pelvic pain in female  Trichomonas infection   49 year old female here with intermittent pelvic pain over the past 3 weeks. Patient is status post hysterectomy, has both ovaries. Denies urinary symptoms or vaginal discharge. Pelvic exam was obtained, bilateral adnexal tenderness, right > left.  Wet prep with trichomonas, gc/chl pending.  U/a appear contaminated.  Pelvic ultrasound was obtained, no acute findings.  I have discussed results with patient, she elected for empiric treatment for gc/chl in addition to her treatment for trichomonas.  Will d/c home with 2 weeks flagyl and doxycycline for coverage of potential PID.  Patient will FU with her PCP and/or OB-GYN.  Discussed plan with patient, he/she acknowledged understanding and agreed with plan of care.  Return precautions given for new or worsening symptoms.  Larene Pickett, PA-C 05/08/15 1521  Evelina Bucy, MD 05/08/15 [phone]

## 2015-05-08 NOTE — Discharge Instructions (Signed)
Take the prescribed medication as directed. Follow-up with your primary care physician. May also follow-up with your OB/GYN. Return to the ED for new or worsening symptoms.

## 2015-05-09 LAB — GC/CHLAMYDIA PROBE AMP (~~LOC~~) NOT AT ARMC
CHLAMYDIA, DNA PROBE: NEGATIVE
Neisseria Gonorrhea: NEGATIVE

## 2015-05-11 ENCOUNTER — Telehealth: Payer: Self-pay | Admitting: Neurology

## 2015-05-11 NOTE — Telephone Encounter (Signed)
Patient called stating last night she experienced distorted vison(letters were upside down, pain left side of her head, dull pain left ear with heaviness in bil legs and inability to move them. This lasted about 30 minutes. Today she is experiencing tightness in upper spine. She requested to see Dr Jannifer Franklin today, I explained to her he would was out of the office until Tuesday.  Please call and advise. She can be reached at [phone].

## 2015-05-14 NOTE — Telephone Encounter (Signed)
I called patient. The patient has had episodes of disordered vision and left-sided head pain. Is likely representative migraine. She is on propranolol taking 20 mg twice daily, she is to go to 30 mg twice daily, and if she is tolerating the drug another week, she is to contact our office, and I will call in a 40 mg tablet to take 1 twice daily.

## 2015-05-20 ENCOUNTER — Emergency Department (HOSPITAL_COMMUNITY)
Admission: EM | Admit: 2015-05-20 | Discharge: 2015-05-20 | Disposition: A | Discharge status: Home / Self Care | Payer: BLUE CROSS/BLUE SHIELD | Attending: Emergency Medicine | Admitting: Emergency Medicine

## 2015-05-20 ENCOUNTER — Encounter (HOSPITAL_COMMUNITY): Payer: Self-pay

## 2015-05-20 DIAGNOSIS — G43009 Migraine without aura, not intractable, without status migrainosus: Secondary | ICD-10-CM | POA: Insufficient documentation

## 2015-05-20 DIAGNOSIS — H409 Unspecified glaucoma: Secondary | ICD-10-CM | POA: Insufficient documentation

## 2015-05-20 DIAGNOSIS — R159 Full incontinence of feces: Secondary | ICD-10-CM | POA: Insufficient documentation

## 2015-05-20 DIAGNOSIS — R197 Diarrhea, unspecified: Secondary | ICD-10-CM | POA: Diagnosis not present

## 2015-05-20 DIAGNOSIS — Z79899 Other long term (current) drug therapy: Secondary | ICD-10-CM | POA: Insufficient documentation

## 2015-05-20 DIAGNOSIS — K219 Gastro-esophageal reflux disease without esophagitis: Secondary | ICD-10-CM | POA: Diagnosis not present

## 2015-05-20 DIAGNOSIS — E559 Vitamin D deficiency, unspecified: Secondary | ICD-10-CM | POA: Insufficient documentation

## 2015-05-20 DIAGNOSIS — E785 Hyperlipidemia, unspecified: Secondary | ICD-10-CM | POA: Diagnosis not present

## 2015-05-20 DIAGNOSIS — Z9889 Other specified postprocedural states: Secondary | ICD-10-CM | POA: Insufficient documentation

## 2015-05-20 DIAGNOSIS — F419 Anxiety disorder, unspecified: Secondary | ICD-10-CM | POA: Insufficient documentation

## 2015-05-20 DIAGNOSIS — G8929 Other chronic pain: Secondary | ICD-10-CM | POA: Insufficient documentation

## 2015-05-20 LAB — URINALYSIS, ROUTINE W REFLEX MICROSCOPIC
Bilirubin Urine: NEGATIVE
GLUCOSE, UA: NEGATIVE mg/dL
Hgb urine dipstick: NEGATIVE
Ketones, ur: NEGATIVE mg/dL
NITRITE: NEGATIVE
PH: 6 (ref 5.0–8.0)
Protein, ur: NEGATIVE mg/dL
SPECIFIC GRAVITY, URINE: 1.025 (ref 1.005–1.030)
Urobilinogen, UA: 0.2 mg/dL (ref 0.0–1.0)

## 2015-05-20 LAB — I-STAT BETA HCG BLOOD, ED (MC, WL, AP ONLY): I-stat hCG, quantitative: <5 m[IU]/mL (ref <5)

## 2015-05-20 LAB — COMPREHENSIVE METABOLIC PANEL
ALT: 20 U/L (ref 14–54)
AST: 27 U/L (ref 15–41)
Albumin: 3.7 g/dL (ref 3.5–5.0)
Alkaline Phosphatase: 71 U/L (ref 38–126)
Anion gap: 6 (ref 5–15)
BUN: 11 mg/dL (ref 6–20)
CHLORIDE: 106 mmol/L (ref 101–111)
CO2: 27 mmol/L (ref 22–32)
CREATININE: 0.44 mg/dL (ref 0.44–1.00)
Calcium: 8.9 mg/dL (ref 8.9–10.3)
GFR calc Af Amer: >60 mL/min (ref >60)
Glucose, Bld: 80 mg/dL (ref 65–99)
POTASSIUM: 3.8 mmol/L (ref 3.5–5.1)
SODIUM: 139 mmol/L (ref 135–145)
Total Bilirubin: 0.5 mg/dL (ref 0.3–1.2)
Total Protein: 7.4 g/dL (ref 6.5–8.1)

## 2015-05-20 LAB — CBC
HEMATOCRIT: 39.6 % (ref 36.0–46.0)
Hemoglobin: 13.2 g/dL (ref 12.0–15.0)
MCH: 30.9 pg (ref 26.0–34.0)
MCHC: 33.3 g/dL (ref 30.0–36.0)
MCV: 92.7 fL (ref 78.0–100.0)
Platelets: 274 10*3/uL (ref 150–400)
RBC: 4.27 MIL/uL (ref 3.87–5.11)
RDW: 14.2 % (ref 11.5–15.5)
WBC: 5.9 10*3/uL (ref 4.0–10.5)

## 2015-05-20 LAB — URINE MICROSCOPIC-ADD ON

## 2015-05-20 MED ORDER — SODIUM CHLORIDE 0.9 % IV BOLUS (SEPSIS)
1000.0000 mL | Freq: Once | INTRAVENOUS | Status: AC
  Administered 2015-05-20: 1000 mL via INTRAVENOUS

## 2015-05-20 NOTE — ED Provider Notes (Signed)
CSN: [id number]     Arrival date & time 05/20/15  1215 History   First MD Initiated Contact with Patient 05/20/15 1339     Chief Complaint  Patient presents with  . Encopresis  . Rectal Pain     (Consider location/radiation/quality/duration/timing/severity/associated sxs/prior Treatment) HPI   49 year old female who presents with concern for fecal incontinence. Has a history of migraine headaches, and follows with a neurologist for unconfirmed multiple sclerosis she says. On my chart review, she follows with [facility] neurology for migraine headaches, and has MRI findings with minimal white matter changes that was felt to be consistent with her migraine headaches and not multiple sclerosis. She has had stable imaging for 15 years, and her neurologist did not feel her symptoms were consistent with multiple sclerosis.  She reports that she typically is very constipated, but over the course of the past 3-4 days she has been having multiple episodes of loose stools. She has also felt weak and unwell during this time complaining of tingling as well as myalgias. She reports that as a result she has been leaking stool. Reports that she is aware that she has to have a bowel movement, but leaks stool in small amounts without trying. Denies any nausea, vomiting, abdominal pain, fevers, chills, or urinary symptoms. States that she has not had any associating back pain, headache, numbness or weakness, urinary retention or incontinence.   Past Medical History  Diagnosis Date  . HYPERLIPIDEMIA 03/27/2010  . ANXIETY 03/27/2010  . COMMON MIGRAINE 03/27/2010  . GERD 03/27/2010  . [place] DISEASE, CERVICAL 03/27/2010  . BACK PAIN, CHRONIC 03/27/2010  . FATIGUE 03/27/2010  . Unspecified vitamin D deficiency 10/23/2010  . Multiple sclerosis 10/23/2010    unconfirmed, but suspected  . Osteoporosis 09/24/2012  . Glaucoma     bilateral   Past Surgical History  Procedure Laterality Date  . Abdominal hysterectomy    .  S/p knee surgury    . S/p eye surgury    . Neg stress test  2007  . Left heart catheterization with coronary angiogram N/A 03/31/2014    Procedure: LEFT HEART CATHETERIZATION WITH CORONARY ANGIOGRAM;  Surgeon: Pixie Casino, MD;  Location: [facility] CATH LAB;  Service: Cardiovascular;  Laterality: N/A;   Family History  Problem Relation Age of Onset  . Heart disease Father   . Diabetes Father   . Hyperlipidemia Father   . Hypertension Father   . Diabetes Other   . Arthritis Other   . Dementia Other   . Cancer Other     2 uncles with lung cancer  . Heart disease Other     2 uncles with heart disease  . Cancer Mother     breast  . Hyperlipidemia Mother   . Lupus Maternal Aunt   . Lupus Cousin     mother and father's side   Social History  Substance Use Topics  . Smoking status: Never Smoker   . Smokeless tobacco: Never Used  . Alcohol Use: No   OB History    No data available     Review of Systems 10/14 systems reviewed and are negative other than those stated in the HPI  Allergies  Augmentin; Lipitor; Lovastatin; Statins; and Topamax  Home Medications   Prior to Admission medications   Medication Sig Start Date End Date Taking? Authorizing Provider  brimonidine-timolol (COMBIGAN) 0.2-0.5 % ophthalmic solution Place 1 drop into both eyes every 12 (twelve) hours.   Yes Historical Provider, MD  cholecalciferol (VITAMIN  D) 1000 UNITS tablet Take 1,000 Units by mouth daily.   Yes Historical Provider, MD  cyclobenzaprine (FLEXERIL) 5 MG tablet Take 1 tablet (5 mg total) by mouth 3 (three) times daily as needed for muscle spasms. 01/03/15  Yes Biagio Borg, MD  doxycycline (VIBRAMYCIN) 100 MG capsule Take 1 capsule (100 mg total) by mouth 2 (two) times daily. 05/08/15  Yes Larene Pickett, PA-C  ibuprofen (ADVIL,MOTRIN) 200 MG tablet Take 400 mg by mouth every 6 (six) hours as needed for fever, headache or mild pain.   Yes Historical Provider, MD  metroNIDAZOLE (FLAGYL) 500 MG  tablet Take 1 tablet (500 mg total) by mouth 2 (two) times daily. 05/08/15  Yes Larene Pickett, PA-C  NITROSTAT 0.4 MG SL tablet Place 1 tablet under the tongue every 15 (fifteen) minutes as needed for chest pain.  12/20/13  Yes Historical Provider, MD  psyllium (HYDROCIL/METAMUCIL) 95 % PACK Take 1 packet by mouth daily as needed for mild constipation.   Yes Historical Provider, MD  ranitidine (ZANTAC) 75 MG tablet Take 150 mg by mouth daily as needed for heartburn.    Yes Historical Provider, MD  simvastatin (ZOCOR) 40 MG tablet Take 1 tablet (40 mg total) by mouth daily. 01/03/15  Yes Biagio Borg, MD  SUMAtriptan (IMITREX) 100 MG tablet Take 1 tablet (100 mg total) by mouth 2 (two) times daily as needed for migraine. 03/27/15  Yes Kathrynn Ducking, MD  propranolol (INDERAL) 10 MG tablet One tablet twice a day for 2 weeks, then take 2 tablets twice a day Patient not taking: Reported on 05/08/2015 03/27/15   Kathrynn Ducking, MD   BP 113/59 mmHg  Pulse 65  Temp(Src) 98.3 F (36.8 C) (Oral)  Resp 18  SpO2 100% Physical Exam Physical Exam  Nursing note and vitals reviewed. Constitutional: Well developed, well nourished, non-toxic, and in no acute distress Head: Normocephalic and atraumatic.  Mouth/Throat: Oropharynx is clear and moist.  Neck: Normal range of motion. Neck supple.  Cardiovascular: Normal rate and regular rhythm.   Pulmonary/Chest: Effort normal and breath sounds normal.  Abdominal: Soft. There is no tenderness. There is no rebound and no guarding. Rectal exam reveals loose tone initially, but able to  full strength in contract of her rectum.  Musculoskeletal: Normal range of motion.  Skin: Skin is warm and dry.  Psychiatric: Cooperative  Neurological:  Alert, oriented to person, place, time, and situation. Memory grossly in tact. Fluent speech. No dysarthria or aphasia.  Cranial nerves:  Pupils are symmetric, and reactive to light. EOMI without nystagmus. No gaze deviation.  Facial muscles symmetric with activation. Sensation to light touch over face in tact bilaterally. Hearing grossly in tact. Palate elevates symmetrically. Head turn and shoulder shrug are intact. Tongue midline.  +1 patellar and achilles reflexes bilaterally Muscle bulk and tone normal. No pronator drift. Moves all extremities symmetrically. Full strength in hip/knee flexion/extension, ankle dorsi or plantar flexion bilaterally Sensation to light touch is in tact throughout in bilateral upper and lower extremities. Coordination reveals no dysmetria with finger to nose. Gait is narrow-based and steady. Non-ataxic.   ED Course  Procedures (including critical care time) Labs Review Labs Reviewed  URINALYSIS, ROUTINE W REFLEX MICROSCOPIC (NOT AT [facility]) - Abnormal; Notable for the following:    Color, Urine AMBER (*)    Leukocytes, UA TRACE (*)    All other components within normal limits  COMPREHENSIVE METABOLIC PANEL  CBC  URINE MICROSCOPIC-ADD ON  I-STAT  BETA HCG BLOOD, ED ([facility], [facility], [facility] ONLY)    Imaging Review No results found. I have personally reviewed and evaluated these images and lab results as part of my medical decision-making.   MDM   Final diagnoses:  Diarrhea  Fecal incontinence   49 year old female who presents with diarrhea and concern for fecal incontinence. She is well-appearing and nontoxic on arrival. Vital signs are unremarkable. On exam she is a soft and nontender abdomen. Neurological exam is also intact. Basic blood work including CBC, CMP, UA are unremarkable. Episode of fecal incontinence seem more likely due to her diarrhea (especially since she is normally constipated at baseline) , not truly neurological in nature. No evidence of back pain, neurological complaints, urinary incontinence or retention, to suspect acute spinal cord lesion. She does report questionable multiple sclerosis, but on my chart review her neurologist is not concerned about this. I also  discussed this with neurology on call, who did not feel MRI was consistent with MS and lesion should not lead to fecal incontinence for her. I do not feel that patient requires any additional workup at this time. Strict return and follow-up instructions are reviewed with her. She expressed understanding of all discharge instructions and felt comfortable to plan of care.   Forde Dandy, MD 05/20/15 [phone]

## 2015-05-20 NOTE — ED Notes (Signed)
Pt c/o bowel incontinence and rectal pain x 4 days and lightheadedness x 3 days.  Pain score 5/10.  Denies n/v.  Denies blood in stool.  Denies back pain/injuries.

## 2015-05-20 NOTE — Discharge Instructions (Signed)
Return without fail for worsening symptoms, including back pain, loss of control of your bladder, numbness or weakness, vomiting unable to keep down food or fluids, or any other symptoms concerning to you. Please follow-up with your primary care provider regarding further ongoing management.  Diarrhea Diarrhea is frequent loose and watery bowel movements. It can cause you to feel weak and dehydrated. Dehydration can cause you to become tired and thirsty, have a dry mouth, and have decreased urination that often is dark yellow. Diarrhea is a sign of another problem, most often an infection that will not last long. In most cases, diarrhea typically lasts 2-3 days. However, it can last longer if it is a sign of something more serious. It is important to treat your diarrhea as directed by your caregiver to lessen or prevent future episodes of diarrhea. CAUSES  Some common causes include:  Gastrointestinal infections caused by viruses, bacteria, or parasites.  Food poisoning or food allergies.  Certain medicines, such as antibiotics, chemotherapy, and laxatives.  Artificial sweeteners and fructose.  Digestive disorders. HOME CARE INSTRUCTIONS  Ensure adequate fluid intake (hydration): Have 1 cup (8 oz) of fluid for each diarrhea episode. Avoid fluids that contain simple sugars or sports drinks, fruit juices, whole milk products, and sodas. Your urine should be clear or pale yellow if you are drinking enough fluids. Hydrate with an oral rehydration solution that you can purchase at pharmacies, retail stores, and online. You can prepare an oral rehydration solution at home by mixing the following ingredients together:   - tsp table salt.   tsp baking soda.   tsp salt substitute containing potassium chloride.  1  tablespoons sugar.  1 L (34 oz) of water.  Certain foods and beverages may increase the speed at which food moves through the gastrointestinal (GI) tract. These foods and beverages  should be avoided and include:  Caffeinated and alcoholic beverages.  High-fiber foods, such as raw fruits and vegetables, nuts, seeds, and whole grain breads and cereals.  Foods and beverages sweetened with sugar alcohols, such as xylitol, sorbitol, and mannitol.  Some foods may be well tolerated and may help thicken stool including:  Starchy foods, such as rice, toast, pasta, low-sugar cereal, oatmeal, grits, baked potatoes, crackers, and bagels.  Bananas.  Applesauce.  Add probiotic-rich foods to help increase healthy bacteria in the GI tract, such as yogurt and fermented milk products.  Wash your hands well after each diarrhea episode.  Only take over-the-counter or prescription medicines as directed by your caregiver.  Take a warm bath to relieve any burning or pain from frequent diarrhea episodes. SEEK IMMEDIATE MEDICAL CARE IF:   You are unable to keep fluids down.  You have persistent vomiting.  You have blood in your stool, or your stools are black and tarry.  You do not urinate in 6-8 hours, or there is only a small amount of very dark urine.  You have abdominal pain that increases or localizes.  You have weakness, dizziness, confusion, or light-headedness.  You have a severe headache.  Your diarrhea gets worse or does not get better.  You have a fever or persistent symptoms for more than 2-3 days.  You have a fever and your symptoms suddenly get worse. MAKE SURE YOU:   Understand these instructions.  Will watch your condition.  Will get help right away if you are not doing well or get worse. Document Released: 08/15/2002 Document Revised: 01/09/2014 Document Reviewed: 05/02/2012 [facility] Patient Information 2015 [place], [place]. This  information is not intended to replace advice given to you by your health care provider. Make sure you discuss any questions you have with your health care provider.  Fecal Incontinence Fecal incontinence is the inability  to control your bowels. When you feel the urge to have a bowel movement, you may not be able to wait until you can get to a restroom. Stool may leak from the rectum unexpectedly. CAUSES   Constipation.  Damage to the nerves of the anal sphincter muscles or the rectum.  Diarrhea.  Damage to the anal sphincter muscles.  Loss of storage capacity in the rectum.  Pelvic floor dysfunction. DIAGNOSIS  Your caregiver will ask questions, do a physical exam, and possibly order other tests. These tests may include:  Anal manometry. This checks the anal sphincter tightness and its ability to respond to signals, as well as the sensitivity and function of the rectum.  Anorectal ultrasonography. This test evaluates the structure of the anal sphincters.  Proctography (also called defecography). This test shows how much stool the rectum can hold, how well the rectum holds it, and how well the rectum can get rid of the stool.  Proctosigmoidoscopy. This test allows caregivers to look inside the rectum for signs of disease or other problems that could cause fecal incontinence, such as inflammation, tumors, or scar tissue.  Anal electromyography. This tests for nerve damage. TREATMENT  Treatment depends on the cause and severity of fecal incontinence. It may include dietary changes, medicine, bowel training, or surgery. More than one treatment may be necessary for successful control. Treatment may include:  Adjusting what and how you eat.  Medicine to help you develop a more regular bowel pattern. Medicine may also be prescribed for diarrhea.  Bowel training to help you learn how to control your bowels (biofeedback). In some cases, it involves strengthening muscles. In others, it means training the bowels to empty at a specific time of day.  Surgery to repair damaged areas or to replace the anal muscle.  A colostomy if other treatments fail. This involves removing a portion of the bowel. The  remaining part is then attached to either the anus, or to a hole in the abdomen (stoma) through which stool leaves the body and is collected in a pouch. HOME CARE INSTRUCTIONS   Eat high fiber foods only if directed by your caregiver. Fiber adds bulk and makes stool easier to control. Oppositely, high fiber foods can act as a laxative and can make the problem worse.  Keep a food diary. List what you eat, how much you eat, and when you have an incontinent episode. After a few days, you may begin to see a pattern involving certain foods and incontinence. After you identify foods that seem to cause problems, cut back on them and see whether incontinence improves.  Avoid the following foods and drinks that often cause diarrhea:  Caffeine.  Spicy foods.  Fatty and greasy foods.  Dairy products (milk, cheese, and ice cream).  Cured or smoked meat like sausage, ham, or [place].  Alcohol.  Fruits like apples, peaches, or pears.  Ask your doctor if you need a vitamin supplement.  Drink enough water and fluids to keep your urine clear or pale yellow.  Wear cotton underwear and loose clothes that "breathe." Tight clothes that block air can worsen anal problems. Change soiled underwear as soon as possible.  Wash the anal area with water, not soap, after each bowel movement to help with anal discomfort. Use  pre-moistened, alcohol-free wipes. Try using non-medicated talcum powder or corn starch to relieve anal discomfort.  Your caregiver may recommend an appropriate cream or ointment that can help prevent skin irritation from direct contact with stool.  Talk to your caregiver if you are having emotional distress. TIPS  Take a bag containing cleanup supplies and a change of clothing with you everywhere.  Locate public restrooms before you need them so you know where to go.  Use the toilet before heading out.  Wear disposable undergarments or sanitary pads if you think an episode is  likely.  Use oral fecal deodorants to add to your comfort level if episodes are frequent. FOR MORE INFORMATION  American Academy [employer]: www.AromatherapyParty.no International Foundation for Functional Gastrointestinal Disorders: www.iffgd.org Document Released: 08/06/2004 Document Revised: 11/17/2011 Document Reviewed: 12/31/2009 [facility] Patient Information 2015 [place], [place]. This information is not intended to replace advice given to you by your health care provider. Make sure you discuss any questions you have with your health care provider.

## 2015-05-24 ENCOUNTER — Emergency Department (HOSPITAL_COMMUNITY): Payer: BLUE CROSS/BLUE SHIELD

## 2015-05-24 ENCOUNTER — Encounter (HOSPITAL_COMMUNITY): Payer: Self-pay | Admitting: Emergency Medicine

## 2015-05-24 ENCOUNTER — Emergency Department (HOSPITAL_COMMUNITY)
Admission: EM | Admit: 2015-05-24 | Discharge: 2015-05-24 | Disposition: A | Discharge status: Home / Self Care | Payer: BLUE CROSS/BLUE SHIELD | Attending: Emergency Medicine | Admitting: Emergency Medicine

## 2015-05-24 DIAGNOSIS — E785 Hyperlipidemia, unspecified: Secondary | ICD-10-CM | POA: Insufficient documentation

## 2015-05-24 DIAGNOSIS — G43109 Migraine with aura, not intractable, without status migrainosus: Secondary | ICD-10-CM | POA: Insufficient documentation

## 2015-05-24 DIAGNOSIS — R51 Headache: Secondary | ICD-10-CM | POA: Diagnosis present

## 2015-05-24 DIAGNOSIS — G8929 Other chronic pain: Secondary | ICD-10-CM | POA: Diagnosis not present

## 2015-05-24 DIAGNOSIS — Z792 Long term (current) use of antibiotics: Secondary | ICD-10-CM | POA: Insufficient documentation

## 2015-05-24 DIAGNOSIS — M810 Age-related osteoporosis without current pathological fracture: Secondary | ICD-10-CM | POA: Insufficient documentation

## 2015-05-24 DIAGNOSIS — G43909 Migraine, unspecified, not intractable, without status migrainosus: Secondary | ICD-10-CM

## 2015-05-24 DIAGNOSIS — E559 Vitamin D deficiency, unspecified: Secondary | ICD-10-CM | POA: Insufficient documentation

## 2015-05-24 DIAGNOSIS — H409 Unspecified glaucoma: Secondary | ICD-10-CM | POA: Insufficient documentation

## 2015-05-24 DIAGNOSIS — F419 Anxiety disorder, unspecified: Secondary | ICD-10-CM | POA: Diagnosis not present

## 2015-05-24 DIAGNOSIS — R42 Dizziness and giddiness: Secondary | ICD-10-CM | POA: Diagnosis not present

## 2015-05-24 DIAGNOSIS — Z9889 Other specified postprocedural states: Secondary | ICD-10-CM | POA: Diagnosis not present

## 2015-05-24 DIAGNOSIS — Z79899 Other long term (current) drug therapy: Secondary | ICD-10-CM | POA: Insufficient documentation

## 2015-05-24 DIAGNOSIS — K219 Gastro-esophageal reflux disease without esophagitis: Secondary | ICD-10-CM | POA: Diagnosis not present

## 2015-05-24 DIAGNOSIS — M6281 Muscle weakness (generalized): Secondary | ICD-10-CM | POA: Diagnosis not present

## 2015-05-24 LAB — CBC WITH DIFFERENTIAL/PLATELET
Basophils Absolute: 0 10*3/uL (ref 0.0–0.1)
Basophils Relative: 0 %
EOS ABS: 0.2 10*3/uL (ref 0.0–0.7)
EOS PCT: 2 %
HCT: 39.9 % (ref 36.0–46.0)
Hemoglobin: 13.4 g/dL (ref 12.0–15.0)
LYMPHS ABS: 2.9 10*3/uL (ref 0.7–4.0)
LYMPHS PCT: 39 %
MCH: 30.8 pg (ref 26.0–34.0)
MCHC: 33.6 g/dL (ref 30.0–36.0)
MCV: 91.7 fL (ref 78.0–100.0)
MONOS PCT: 7 %
Monocytes Absolute: 0.6 10*3/uL (ref 0.1–1.0)
Neutro Abs: 3.9 10*3/uL (ref 1.7–7.7)
Neutrophils Relative %: 52 %
PLATELETS: 302 10*3/uL (ref 150–400)
RBC: 4.35 MIL/uL (ref 3.87–5.11)
RDW: 13.7 % (ref 11.5–15.5)
WBC: 7.5 10*3/uL (ref 4.0–10.5)

## 2015-05-24 LAB — PROTIME-INR
INR: 0.99 (ref 0.00–1.49)
Prothrombin Time: 13.3 seconds (ref 11.6–15.2)

## 2015-05-24 LAB — COMPREHENSIVE METABOLIC PANEL
ALK PHOS: 69 U/L (ref 38–126)
ALT: 19 U/L (ref 14–54)
ANION GAP: 6 (ref 5–15)
AST: 27 U/L (ref 15–41)
Albumin: 4 g/dL (ref 3.5–5.0)
BUN: 9 mg/dL (ref 6–20)
CALCIUM: 9.1 mg/dL (ref 8.9–10.3)
CO2: 26 mmol/L (ref 22–32)
CREATININE: 0.55 mg/dL (ref 0.44–1.00)
Chloride: 104 mmol/L (ref 101–111)
Glucose, Bld: 107 mg/dL — ABNORMAL HIGH (ref 65–99)
Potassium: 4 mmol/L (ref 3.5–5.1)
SODIUM: 136 mmol/L (ref 135–145)
Total Bilirubin: 0.5 mg/dL (ref 0.3–1.2)
Total Protein: 8 g/dL (ref 6.5–8.1)

## 2015-05-24 LAB — APTT: APTT: 27 s (ref 24–37)

## 2015-05-24 MED ORDER — VALPROATE SODIUM 500 MG/5ML IV SOLN: 500.0000 mg | Freq: Once | INTRAVENOUS | Status: DC

## 2015-05-24 MED ORDER — SODIUM CHLORIDE 0.9 % IV BOLUS (SEPSIS)
1000.0000 mL | Freq: Once | INTRAVENOUS | Status: AC
  Administered 2015-05-24: 1000 mL via INTRAVENOUS

## 2015-05-24 MED ORDER — LORAZEPAM 2 MG/ML IJ SOLN
0.5000 mg | Freq: Once | INTRAMUSCULAR | Status: DC
  Filled 2015-05-24: qty 1

## 2015-05-24 MED ORDER — ACETAMINOPHEN 500 MG PO TABS
1000.0000 mg | ORAL_TABLET | Freq: Once | ORAL | Status: AC
  Administered 2015-05-24: 1000 mg via ORAL
  Filled 2015-05-24: qty 2

## 2015-05-24 MED ORDER — KETOROLAC TROMETHAMINE 30 MG/ML IJ SOLN
30.0000 mg | Freq: Once | INTRAMUSCULAR | Status: AC
  Administered 2015-05-24: 30 mg via INTRAVENOUS
  Filled 2015-05-24: qty 1

## 2015-05-24 MED ORDER — MORPHINE SULFATE (PF) 4 MG/ML IV SOLN
4.0000 mg | Freq: Once | INTRAVENOUS | Status: AC
  Administered 2015-05-24: 4 mg via INTRAVENOUS
  Filled 2015-05-24: qty 1

## 2015-05-24 MED ORDER — VALPROATE SODIUM 500 MG/5ML IV SOLN
500.0000 mg | Freq: Once | INTRAVENOUS | Status: DC
  Filled 2015-05-24: qty 5

## 2015-05-24 MED ORDER — DEXAMETHASONE SODIUM PHOSPHATE 10 MG/ML IJ SOLN
10.0000 mg | Freq: Once | INTRAMUSCULAR | Status: DC
  Filled 2015-05-24: qty 1

## 2015-05-24 MED ORDER — ONDANSETRON HCL 4 MG/2ML IJ SOLN
4.0000 mg | Freq: Once | INTRAMUSCULAR | Status: AC
  Administered 2015-05-24: 4 mg via INTRAVENOUS
  Filled 2015-05-24: qty 2

## 2015-05-24 MED ORDER — METOCLOPRAMIDE HCL 5 MG/ML IJ SOLN
10.0000 mg | Freq: Once | INTRAMUSCULAR | Status: AC
  Administered 2015-05-24: 10 mg via INTRAVENOUS
  Filled 2015-05-24: qty 2

## 2015-05-24 NOTE — ED Notes (Signed)
Pt states she woke up about an hour ago with pain in the right side of her head  Pt states it feels like someone hitting her with a hammer  Pt states she has had distorted vision and difficulty ambulating to the restroom  No neuro deficit noted

## 2015-05-24 NOTE — ED Notes (Signed)
Pt not wanting remaining meds.  Will notify MD

## 2015-05-24 NOTE — ED Provider Notes (Signed)
CSN: [id number]     Arrival date & time 05/24/15  0341 History   First MD Initiated Contact with Patient 05/24/15 0356     Chief Complaint  Patient presents with  . Headache     (Consider location/radiation/quality/duration/timing/severity/associated sxs/prior Treatment) HPI Patient states that she went to bed with a "nagging" headache. She woke at 1 AM with severe right sided headache. Associated with vertiginous symptoms. She states she was having trouble reading. Also having trouble ambulating. No changes in speech pattern. Patient states this is not similar to previous migraines. Denies any nausea or vomiting. Has mild right-sided weakness compared to left side.  Past Medical History  Diagnosis Date  . HYPERLIPIDEMIA 03/27/2010  . ANXIETY 03/27/2010  . COMMON MIGRAINE 03/27/2010  . GERD 03/27/2010  . [place] DISEASE, CERVICAL 03/27/2010  . BACK PAIN, CHRONIC 03/27/2010  . FATIGUE 03/27/2010  . Unspecified vitamin D deficiency 10/23/2010  . Multiple sclerosis 10/23/2010    unconfirmed, but suspected  . Osteoporosis 09/24/2012  . Glaucoma     bilateral   Past Surgical History  Procedure Laterality Date  . Abdominal hysterectomy    . S/p knee surgury    . S/p eye surgury    . Neg stress test  2007  . Left heart catheterization with coronary angiogram N/A 03/31/2014    Procedure: LEFT HEART CATHETERIZATION WITH CORONARY ANGIOGRAM;  Surgeon: Pixie Casino, MD;  Location: [facility] CATH LAB;  Service: Cardiovascular;  Laterality: N/A;   Family History  Problem Relation Age of Onset  . Heart disease Father   . Diabetes Father   . Hyperlipidemia Father   . Hypertension Father   . Diabetes Other   . Arthritis Other   . Dementia Other   . Cancer Other     2 uncles with lung cancer  . Heart disease Other     2 uncles with heart disease  . Cancer Mother     breast  . Hyperlipidemia Mother   . Lupus Maternal Aunt   . Lupus Cousin     mother and father's side   Social History  Substance  Use Topics  . Smoking status: Never Smoker   . Smokeless tobacco: Never Used  . Alcohol Use: No   OB History    No data available     Review of Systems  Constitutional: Negative for fever and chills.  Eyes: Positive for visual disturbance. Negative for photophobia.  Respiratory: Negative for shortness of breath.   Cardiovascular: Negative for chest pain.  Gastrointestinal: Negative for nausea, vomiting, abdominal pain and diarrhea.  Musculoskeletal: Negative for back pain, neck pain and neck stiffness.  Skin: Negative for rash and wound.  Neurological: Positive for dizziness, weakness and headaches. Negative for speech difficulty and numbness.  All other systems reviewed and are negative.     Allergies  Augmentin; Lipitor; Lovastatin; Statins; and Topamax  Home Medications   Prior to Admission medications   Medication Sig Start Date End Date Taking? Authorizing Provider  brimonidine-timolol (COMBIGAN) 0.2-0.5 % ophthalmic solution Place 1 drop into both eyes every 12 (twelve) hours.   Yes Historical Provider, MD  cholecalciferol (VITAMIN D) 1000 UNITS tablet Take 1,000 Units by mouth daily.   Yes Historical Provider, MD  ibuprofen (ADVIL,MOTRIN) 200 MG tablet Take 400 mg by mouth every 6 (six) hours as needed for fever, headache or mild pain.   Yes Historical Provider, MD  simvastatin (ZOCOR) 40 MG tablet Take 1 tablet (40 mg total) by mouth daily. 01/03/15  Yes Biagio Borg, MD  cyclobenzaprine (FLEXERIL) 5 MG tablet Take 1 tablet (5 mg total) by mouth 3 (three) times daily as needed for muscle spasms. Patient not taking: Reported on 05/24/2015 01/03/15   Biagio Borg, MD  doxycycline (VIBRAMYCIN) 100 MG capsule Take 1 capsule (100 mg total) by mouth 2 (two) times daily. 05/08/15   Larene Pickett, PA-C  metroNIDAZOLE (FLAGYL) 500 MG tablet Take 1 tablet (500 mg total) by mouth 2 (two) times daily. Patient not taking: Reported on 05/24/2015 05/08/15   Larene Pickett, PA-C  NITROSTAT  0.4 MG SL tablet Place 1 tablet under the tongue every 15 (fifteen) minutes as needed for chest pain.  12/20/13   Historical Provider, MD  propranolol (INDERAL) 10 MG tablet One tablet twice a day for 2 weeks, then take 2 tablets twice a day Patient not taking: Reported on 05/08/2015 03/27/15   Kathrynn Ducking, MD  ranitidine (ZANTAC) 75 MG tablet Take 150 mg by mouth daily as needed for heartburn.     Historical Provider, MD  SUMAtriptan (IMITREX) 100 MG tablet Take 1 tablet (100 mg total) by mouth 2 (two) times daily as needed for migraine. 03/27/15   Kathrynn Ducking, MD   BP 94/71 mmHg  Pulse 66  Temp(Src) 97.6 F (36.4 C) (Oral)  Resp 20  Ht 5\' 3"  (1.6 m)  Wt 170 lb (77.111 kg)  BMI 30.12 kg/m2  SpO2 100% Physical Exam  Constitutional: She is oriented to person, place, and time. She appears well-developed and well-nourished. No distress.  HENT:  Head: Normocephalic and atraumatic.  Mouth/Throat: Oropharynx is clear and moist. No oropharyngeal exudate.  Eyes: EOM are normal.  Right pupil is 4 mm and minimally reactive. Left pupil is 2 mm and reactive. Rotary nystagmus present  Neck: Normal range of motion. Neck supple.  No meningismus  Cardiovascular: Normal rate and regular rhythm.  Exam reveals no gallop and no friction rub.   No murmur heard. Pulmonary/Chest: Effort normal and breath sounds normal. No respiratory distress. She has no wheezes. She has no rales. She exhibits no tenderness.  Abdominal: Soft. Bowel sounds are normal. She exhibits no distension and no mass. There is no tenderness. There is no rebound and no guarding.  Musculoskeletal: Normal range of motion. She exhibits no edema or tenderness.  Neurological: She is alert and oriented to person, place, and time.  Questionable mild right upper and lower extremity weakness compared to left. Sensation fully intact. Finger to nose bilaterally intact.  Skin: Skin is warm and dry. No rash noted. No erythema.  Psychiatric:  She has a normal mood and affect. Her behavior is normal.  Nursing note and vitals reviewed.   ED Course  Procedures (including critical care time) Labs Review Labs Reviewed  COMPREHENSIVE METABOLIC PANEL - Abnormal; Notable for the following:    Glucose, Bld 107 (*)    All other components within normal limits  CBC WITH DIFFERENTIAL/PLATELET  PROTIME-INR  APTT    Imaging Review Ct Head Wo Contrast  05/24/2015   CLINICAL DATA:  Right-sided headache. Right-sided weakness for 4 hours. Unsteady gait.  EXAM: CT HEAD WITHOUT CONTRAST  TECHNIQUE: Contiguous axial images were obtained from the base of the skull through the vertex without intravenous contrast.  COMPARISON:  Brain MRI 11/22/2014  FINDINGS: No intracranial hemorrhage, mass effect, or midline shift. No hydrocephalus. The basilar cisterns are patent. No evidence of territorial infarct. No intracranial fluid collection. Calvarium is intact. Included paranasal sinuses  and mastoid air cells are well aerated.  IMPRESSION: No acute intracranial abnormality.   Electronically Signed   By: Jeb Levering M.D.   On: 05/24/2015 05:01   Mr Brain Wo Contrast  05/24/2015   CLINICAL DATA:  Woke up in our ago with pain in the right-sided head. Dizziness.  EXAM: MRI HEAD WITHOUT CONTRAST  TECHNIQUE: Multiplanar, multiecho pulse sequences of the brain and surrounding structures were obtained without intravenous contrast.  COMPARISON:  11/22/2014  FINDINGS: Calvarium and upper cervical spine: No focal marrow signal abnormality.  Orbits: Bilateral staphyloma.  Bilateral cataract resection.  Sinuses and Mastoids: Chronic right more than left mastoid fluid/ mucosal thickening, stable from previous. The nasopharynx is clear. The sinuses are clear.  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  Abnormal number of T2 and FLAIR hyperintense foci in the periventricular, deep, and juxta  cortical cerebral white matter. No infratentorial or corpus callosum lesions. No progression since prior and none of these restrict diffusion. As noted previously, these could be related to multiple sclerosis (patient has EMR history of multiple sclerosis listed in 2012) premature chronic small vessel disease, or complicated migraine (history of migraine in the EMR).  No acute infarct, hemorrhage, hydrocephalus, or mass lesion.  IMPRESSION: 1. No acute finding. 2. Stable mild white matter disease which could be related to patient's history of multiple sclerosis or migraine. Early small vessel ischemic disease would also have this appearance. No progression since March 2016. 3. 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   Electronically Signed   By: Monte Fantasia M.D.   On: 05/24/2015 07:29   I have personally reviewed and evaluated these images and lab results as part of my medical decision-making.   EKG Interpretation   Date/Time:  Thursday May 24 2015 04:18:38 EDT Ventricular Rate:  74 PR Interval:  162 QRS Duration: 83 QT Interval:  405 QTC Calculation: 449 R Axis:   3 Text Interpretation:  Sinus rhythm Low voltage, precordial leads Confirmed  by Lita Mains  MD, Gabrille Kilbride ([id number]) on 05/24/2015 5:40:58 AM      MDM   Final diagnoses:  Complicated migraine   Patient's headache is improved though continues to have dizziness. Discussed with Dr. Leonel Ramsay. We'll get MRI and neurology will see patient.   Signed out to oncoming emergency physician pending results of MRI and neurology consultation.  Julianne Rice, MD 05/24/15 2300

## 2015-05-24 NOTE — ED Notes (Signed)
Pt states she has a hx of migraine headaches but this does not feel like any migraine she has had before

## 2015-05-24 NOTE — Discharge Instructions (Signed)

## 2015-05-24 NOTE — ED Provider Notes (Signed)
Care assumed from Dr. Lita Mains.  MRI is negative.  Neurology saw patient and suspected her symptoms were secondary to complicated migraine.  Her HA has improved somewhat and she would like to go home to go to sleep.  Her weakness has also improved and she was able to stand and ambulate well to the restroom.   Clinical Impression: 1. Complicated migraine       Serita Grit, MD 05/24/15 1114

## 2015-05-24 NOTE — Consult Note (Addendum)
NEURO HOSPITALIST CONSULT NOTE   Referring physician: ED Reason for Consult: HA  HPI:                                                                                                                                          [name] is an 49 y.o. female with a past medical history significant for hyperlipidemia, anxiety, chronic back pain, right retinal detachment, s/p right eye surgery, and episodic migraine without aura, comes in with complains of having " a different type of HA". She indicated that this morning she was woke up by an unusually severe HA that for the first time in her life is located in the right right side of her head and was followed by visual distortion  as well as imbalance, dizziness, and heaviness of her right arm-leg that she never experienced before. There was not associated vomiting, slurred speech, confusion, or language impairment. Stated that she decided to come to the ED because this HA is something different and she was concerned about a serious brain issue. Denies recent fever, infection, head or neck trauma. MRI brain performed today was personally reviewed and showed no acute abnormality. Chronic T2 and FLAIR hyperintense foci in the periventricular, deep, and juxta cortical cerebral white matter are unchanged.  [name] tells me that she is averaging one migraine attack weekly which is typically aborted by sumatriptan in a short period of time without causing side effects. Unfortunately, she stopped taking propranolol for migraine prevention " because it caused low blood pressure". While in the ED she received 1 liter NSS ,zofran 4 mg IV x 1 dose, reglan 10 mg IV x 1 dose, toradol 30 mg IV x 1 dose, and morphine 4 mg IV once and said that the HA is improving.   Past Medical History  Diagnosis Date  . HYPERLIPIDEMIA 03/27/2010  . ANXIETY 03/27/2010  . COMMON MIGRAINE 03/27/2010  . GERD 03/27/2010  . [place] DISEASE, CERVICAL 03/27/2010  .  BACK PAIN, CHRONIC 03/27/2010  . FATIGUE 03/27/2010  . Unspecified vitamin D deficiency 10/23/2010  . Multiple sclerosis 10/23/2010    unconfirmed, but suspected  . Osteoporosis 09/24/2012  . Glaucoma     bilateral    Past Surgical History  Procedure Laterality Date  . Abdominal hysterectomy    . S/p knee surgury    . S/p eye surgury    . Neg stress test  2007  . Left heart catheterization with coronary angiogram N/A 03/31/2014    Procedure: LEFT HEART CATHETERIZATION WITH CORONARY ANGIOGRAM;  Surgeon: Pixie Casino, MD;  Location: [facility] CATH LAB;  Service: Cardiovascular;  Laterality: N/A;    Family History  Problem Relation Age of Onset  . Heart disease Father   . Diabetes Father   .  Hyperlipidemia Father   . Hypertension Father   . Diabetes Other   . Arthritis Other   . Dementia Other   . Cancer Other     2 uncles with lung cancer  . Heart disease Other     2 uncles with heart disease  . Cancer Mother     breast  . Hyperlipidemia Mother   . Lupus Maternal Aunt   . Lupus Cousin     mother and father's side    Family History: no migraine, brain tumor, or brain aneurysm   Social History:  reports that she has never smoked. She has never used smokeless tobacco. She reports that she does not drink alcohol or use illicit drugs.  Allergies  Allergen Reactions  . Augmentin [Amoxicillin-Pot Clavulanate] Rash and Other (See Comments)    Pt stated "throat felt funny"  . Lipitor [Atorvastatin] Other (See Comments)    myalgia  . Lovastatin Other (See Comments)    myalgias  . Statins Nausea And Vomiting  . Topamax Other (See Comments)    dizziness    MEDICATIONS:                                                                                                                     I have reviewed the patient's current medications.   ROS:                                                                                                                                        History obtained from chart review and the patient  General ROS: negative for - chills, fatigue, fever, night sweats, or weight loss Psychological ROS: negative for - behavioral disorder, hallucinations, memory difficulties, mood swings or suicidal ideation Ophthalmic ROS: negative for - blurry vision, double vision, eye pain or loss of vision ENT ROS: negative for - epistaxis, nasal discharge, oral lesions, sore throat, tinnitus or vertigo Allergy and Immunology ROS: negative for - hives or itchy/watery eyes Hematological and Lymphatic ROS: negative for - bleeding problems, bruising or swollen lymph nodes Endocrine ROS: negative for - galactorrhea, hair pattern changes, polydipsia/polyuria or temperature intolerance Respiratory ROS: negative for - cough, hemoptysis, shortness of breath or wheezing Cardiovascular ROS: negative for - chest pain, dyspnea on exertion, edema or irregular heartbeat Gastrointestinal ROS: negative for - abdominal pain, diarrhea, hematemesis, nausea/vomiting or stool incontinence Genito-Urinary ROS: negative for - dysuria, hematuria, incontinence  or urinary frequency/urgency Musculoskeletal ROS: negative for - joint swelling or muscular weakness Neurological ROS: as noted in HPI Dermatological ROS: negative for rash and skin lesion changes   Physical exam:  Constitutional: well developed, pleasant female in no apparent distress. Blood pressure 107/58, pulse 61, temperature 98.2 F (36.8 C), temperature source Oral, resp. rate 18, height '5\' 3"'  (1.6 m), weight 77.111 kg (170 lb), SpO2 100 %. Eyes: no jaundice or exophthalmos.  Head: normocephalic. Neck: supple, no bruits, no JVD. Cardiac: no murmurs. Lungs: clear. Abdomen: soft, no tender, no mass. Extremities: no edema, clubbing, or cyanosis.  Skin: no rash  Neurologic Examination:                                                                                                      General: Mental  Status: Alert, oriented, thought content appropriate.  Speech fluent without evidence of aphasia.  Able to follow 3 step commands without difficulty. Cranial Nerves: II: Discs flat bilaterally; Visual fields grossly normal, pupils asymmetric right larger than left but reactive to light and accommodation III,IV, VI: ptosis not present, extra-ocular motions intact bilaterally V,VII: smile symmetric, facial light touch sensation normal bilaterally VIII: hearing normal bilaterally IX,X: uvula rises symmetrically XI: bilateral shoulder shrug XII: midline tongue extension without atrophy or fasciculations  Motor: Right : Upper extremity   5/5    Left:     Upper extremity   5/5  Lower extremity   5/5     Lower extremity   5/5 Tone and bulk:normal tone throughout; no atrophy noted Sensory: Pinprick and light touch intact throughout, bilaterally Deep Tendon Reflexes:  Right: Upper Extremity   Left: Upper extremity   biceps (C-5 to C-6) 2/4   biceps (C-5 to C-6) 2/4 tricep (C7) 2/4    triceps (C7) 2/4 Brachioradialis (C6) 2/4  Brachioradialis (C6) 2/4  Lower Extremity Lower Extremity  quadriceps (L-2 to L-4) 2/4   quadriceps (L-2 to L-4) 2/4 Achilles (S1) 2/4   Achilles (S1) 2/4  Plantars: Right: downgoing   Left: downgoing Cerebellar: normal finger-to-nose,  normal heel-to-shin test Gait:  No tested due to multiple leads and safety concerns.    Lab Results  Component Value Date/Time   CHOL 199 01/03/2015 04:33 PM    Results for orders placed or performed during the hospital encounter of 05/24/15 (from the past 48 hour(s))  CBC with Differential/Platelet     Status: None   Collection Time: 05/24/15  4:15 AM  Result Value Ref Range   WBC 7.5 4.0 - 10.5 K/uL   RBC 4.35 3.87 - 5.11 MIL/uL   Hemoglobin 13.4 12.0 - 15.0 g/dL   HCT 39.9 36.0 - 46.0 %   MCV 91.7 78.0 - 100.0 fL   MCH 30.8 26.0 - 34.0 pg   MCHC 33.6 30.0 - 36.0 g/dL   RDW 13.7 11.5 - 15.5 %   Platelets 302 150 -  400 K/uL   Neutrophils Relative % 52 %   Neutro Abs 3.9 1.7 - 7.7 K/uL   Lymphocytes Relative 39 %   Lymphs Abs  2.9 0.7 - 4.0 K/uL   Monocytes Relative 7 %   Monocytes Absolute 0.6 0.1 - 1.0 K/uL   Eosinophils Relative 2 %   Eosinophils Absolute 0.2 0.0 - 0.7 K/uL   Basophils Relative 0 %   Basophils Absolute 0.0 0.0 - 0.1 K/uL  Comprehensive metabolic panel     Status: Abnormal   Collection Time: 05/24/15  4:15 AM  Result Value Ref Range   Sodium 136 135 - 145 mmol/L   Potassium 4.0 3.5 - 5.1 mmol/L   Chloride 104 101 - 111 mmol/L   CO2 26 22 - 32 mmol/L   Glucose, Bld 107 (H) 65 - 99 mg/dL   BUN 9 6 - 20 mg/dL   Creatinine, Ser 0.55 0.44 - 1.00 mg/dL   Calcium 9.1 8.9 - 10.3 mg/dL   Total Protein 8.0 6.5 - 8.1 g/dL   Albumin 4.0 3.5 - 5.0 g/dL   AST 27 15 - 41 U/L   ALT 19 14 - 54 U/L   Alkaline Phosphatase 69 38 - 126 U/L   Total Bilirubin 0.5 0.3 - 1.2 mg/dL   GFR calc non Af Amer >60 >60 mL/min   GFR calc Af Amer >60 >60 mL/min    Comment: (NOTE) The eGFR has been calculated using the CKD EPI equation. This calculation has not been validated in all clinical situations. eGFR's persistently <60 mL/min signify possible Chronic Kidney Disease.    Anion gap 6 5 - 15  Protime-INR     Status: None   Collection Time: 05/24/15  4:15 AM  Result Value Ref Range   Prothrombin Time 13.3 11.6 - 15.2 seconds   INR 0.99 0.00 - 1.49  APTT     Status: None   Collection Time: 05/24/15  4:15 AM  Result Value Ref Range   aPTT 27 24 - 37 seconds    Ct Head Wo Contrast  05/24/2015   CLINICAL DATA:  Right-sided headache. Right-sided weakness for 4 hours. Unsteady gait.  EXAM: CT HEAD WITHOUT CONTRAST  TECHNIQUE: Contiguous axial images were obtained from the base of the skull through the vertex without intravenous contrast.  COMPARISON:  Brain MRI 11/22/2014  FINDINGS: No intracranial hemorrhage, mass effect, or midline shift. No hydrocephalus. The basilar cisterns are patent. No  evidence of territorial infarct. No intracranial fluid collection. Calvarium is intact. Included paranasal sinuses and mastoid air cells are well aerated.  IMPRESSION: No acute intracranial abnormality.   Electronically Signed   By: Jeb Levering M.D.   On: 05/24/2015 05:01   Mr Brain Wo Contrast  05/24/2015   CLINICAL DATA:  Woke up in our ago with pain in the right-sided head. Dizziness.  EXAM: MRI HEAD WITHOUT CONTRAST  TECHNIQUE: Multiplanar, multiecho pulse sequences of the brain and surrounding structures were obtained without intravenous contrast.  COMPARISON:  11/22/2014  FINDINGS: Calvarium and upper cervical spine: No focal marrow signal abnormality.  Orbits: Bilateral staphyloma.  Bilateral cataract resection.  Sinuses and Mastoids: Chronic right more than left mastoid fluid/ mucosal thickening, stable from previous. The nasopharynx is clear. The sinuses are clear.  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  Abnormal number of T2 and FLAIR hyperintense foci in the periventricular, deep, and juxta cortical cerebral white matter. No infratentorial or corpus callosum lesions. No progression since prior and none of these restrict diffusion. As noted previously, these could be related to multiple sclerosis (patient has EMR history of  multiple sclerosis listed in 2012) premature chronic small vessel disease, or complicated migraine (history of migraine in the EMR).  No acute infarct, hemorrhage, hydrocephalus, or mass lesion.  IMPRESSION: 1. No acute finding. 2. Stable mild white matter disease which could be related to patient's history of multiple sclerosis or migraine. Early small vessel ischemic disease would also have this appearance. No progression since March 2016. 3. 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   Electronically Signed   By: Monte Fantasia M.D.   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 Margette Fast at [facility], comes in with concerns about experiencing " a new type of HA located in the right side of the head with heaviness of the right arm-leg, visual distortion, imbalance, and dizziness". Neuroexam showed asymmetric but reactive pupils (old finding as per patient, result of right eye surgery) and mild grip weakness. MRI brain is without acute abnormality. Patient HA is likely consistent with a complicated migraine, and she is improving with the treatment administered in the ED. No further inpatient neurological intervention required at this time. Advised to follow up with her neurologist as scheduled.   Dorian Pod, MD 05/24/2015, 9:20 AM  [facility]

## 2015-05-29 ENCOUNTER — Telehealth: Payer: Self-pay | Admitting: Neurology

## 2015-05-29 NOTE — Telephone Encounter (Signed)
I called the patient. She has been having distorted vision. She states sometimes someone's eyes look like they are on the person's chin. Sometimes she cannot see at all. She has been to the ED several times for this without any answers, per the patient. She would like to come in to see Dr. Jannifer Franklin. Appointment scheduled 9/22.

## 2015-05-29 NOTE — Telephone Encounter (Signed)
Patient is having distorted vision to the point she cannot see at all.  She has been to the ER 3 times since her last appointment where she had a cat-skan and MRI. She states this comes and goes and at that time she can't see a thing and cannot walk. Please call patient.

## 2015-05-30 ENCOUNTER — Ambulatory Visit: Payer: BLUE CROSS/BLUE SHIELD | Admitting: Adult Health

## 2015-05-30 ENCOUNTER — Encounter: Payer: Self-pay | Admitting: Neurology

## 2015-05-30 ENCOUNTER — Other Ambulatory Visit: Payer: Self-pay | Admitting: Internal Medicine

## 2015-05-30 ENCOUNTER — Ambulatory Visit (INDEPENDENT_AMBULATORY_CARE_PROVIDER_SITE_OTHER): Payer: BLUE CROSS/BLUE SHIELD | Admitting: Neurology

## 2015-05-30 VITALS — BP 120/82 | HR 60 | Ht 64.0 in | Wt 221.0 lb

## 2015-05-30 DIAGNOSIS — G43109 Migraine with aura, not intractable, without status migrainosus: Secondary | ICD-10-CM

## 2015-05-30 DIAGNOSIS — G43119 Migraine with aura, intractable, without status migrainosus: Secondary | ICD-10-CM | POA: Diagnosis not present

## 2015-05-30 HISTORY — DX: Migraine with aura, not intractable, without status migrainosus: G43.109

## 2015-05-30 MED ORDER — NORTRIPTYLINE HCL 10 MG PO CAPS: ORAL_CAPSULE | ORAL | Status: DC

## 2015-05-30 NOTE — Patient Instructions (Addendum)
 Pamelor (nortriptyline) is an antidepressant medication that has many uses that may include headache, whiplash injuries, or for peripheral neuropathy pain. Side effects may include drowsiness, dry mouth, blurred vision, or constipation. As with any antidepressant medication, worsening depression may occur. If you had any significant side effects, please call our office. The full effects of this medication may take 7-10 days after starting the drug, or going up on the dose.  Migraine Headache A migraine headache is an intense, throbbing pain on one or both sides of your head. A migraine can last for 30 minutes to several hours. CAUSES  The exact cause of a migraine headache is not always known. However, a migraine may be caused when nerves in the brain become irritated and release chemicals that cause inflammation. This causes pain. Certain things may also trigger migraines, such as:  Alcohol.  Smoking.  Stress.  Menstruation.  Aged cheeses.  Foods or drinks that contain nitrates, glutamate, aspartame, or tyramine.  Lack of sleep.  Chocolate.  Caffeine.  Hunger.  Physical exertion.  Fatigue.  Medicines used to treat chest pain (nitroglycerine), birth control pills, estrogen, and some blood pressure medicines. SIGNS AND SYMPTOMS  Pain on one or both sides of your head.  Pulsating or throbbing pain.  Severe pain that prevents daily activities.  Pain that is aggravated by any physical activity.  Nausea, vomiting, or both.  Dizziness.  Pain with exposure to bright lights, loud noises, or activity.  General sensitivity to bright lights, loud noises, or smells. Before you get a migraine, you may get warning signs that a migraine is coming (aura). An aura may include:  Seeing flashing lights.  Seeing bright spots, halos, or zigzag lines.  Having tunnel vision or blurred vision.  Having feelings of numbness or tingling.  Having trouble talking.  Having muscle  weakness. DIAGNOSIS  A migraine headache is often diagnosed based on:  Symptoms.  Physical exam.  A CT scan or MRI of your head. These imaging tests cannot diagnose migraines, but they can help rule out other causes of headaches. TREATMENT Medicines may be given for pain and nausea. Medicines can also be given to help prevent recurrent migraines.  HOME CARE INSTRUCTIONS  Only take over-the-counter or prescription medicines for pain or discomfort as directed by your health care provider. The use of long-term narcotics is not recommended.  Lie down in a dark, quiet room when you have a migraine.  Keep a journal to find out what may trigger your migraine headaches. For example, write down:  What you eat and drink.  How much sleep you get.  Any change to your diet or medicines.  Limit alcohol consumption.  Quit smoking if you smoke.  Get 7-9 hours of sleep, or as recommended by your health care provider.  Limit stress.  Keep lights dim if bright lights bother you and make your migraines worse. SEEK IMMEDIATE MEDICAL CARE IF:   Your migraine becomes severe.  You have a fever.  You have a stiff neck.  You have vision loss.  You have muscular weakness or loss of muscle control.  You start losing your balance or have trouble walking.  You feel faint or pass out.  You have severe symptoms that are different from your first symptoms. MAKE SURE YOU:   Understand these instructions.  Will watch your condition.  Will get help right away if you are not doing well or get worse. Document Released: 08/25/2005 Document Revised: 01/09/2014 Document Reviewed: 05/02/2013 ExitCare   Information 2015 [employer]. This information is not intended to replace advice given to you by your health care provider. Make sure you discuss any questions you have with your health care provider.  

## 2015-05-30 NOTE — Progress Notes (Signed)
Reason for visit: Migraine headache  [name] is an 49 y.o. female  History of present illness:  [name] is a 49 year old right-handed black female with a history of migraine headache. The patient has begun having events approximately 6 weeks ago. The patient had been on propranolol, but she stopped the medication as there were concerns about low blood pressure. Shortly after coming off of the propranolol, the patient began having episodes of visual changes associated with fortification spectra with bright lights and a central scotoma. The episodes would start in one visual field, and then cross midline to the other visual field, affecting both eyes. The patient would have visual symptoms for about 20 or 25 minutes, the vision was clear up, and a mild headache may ensue. The patient has had frequent events, almost daily at this point. The patient has taken Imitrex, but more recently she has had some generalized burning sensations associated with the medication. She has become quite concerned about the visual changes, she has been to the emergency room on 3 occasions since 05/08/2015, the last visit was on 05/24/2015. MRI of the brain was done that time, this was reviewed online and appears to show nonspecific punctate changes in the brain with good stability from the prior study. The lesions are consistent with a history of migraine headache. There has been some question of demyelinating disease in the past. The patient returns this office for further evaluation. She indicates that with one of the headaches she had some sensation of numbness on the right base and some question small weakness on the right side.  Past Medical History  Diagnosis Date  . HYPERLIPIDEMIA 03/27/2010  . ANXIETY 03/27/2010  . COMMON MIGRAINE 03/27/2010  . GERD 03/27/2010  . [place] DISEASE, CERVICAL 03/27/2010  . BACK PAIN, CHRONIC 03/27/2010  . FATIGUE 03/27/2010  . Unspecified vitamin D deficiency 10/23/2010  .  Multiple sclerosis 10/23/2010    unconfirmed, but suspected  . Osteoporosis 09/24/2012  . Glaucoma     bilateral  . Classic migraine with aura 05/30/2015    Past Surgical History  Procedure Laterality Date  . Abdominal hysterectomy    . S/p knee surgury    . S/p eye surgury    . Neg stress test  2007  . Left heart catheterization with coronary angiogram N/A 03/31/2014    Procedure: LEFT HEART CATHETERIZATION WITH CORONARY ANGIOGRAM;  Surgeon: Pixie Casino, MD;  Location: [facility] CATH LAB;  Service: Cardiovascular;  Laterality: N/A;    Family History  Problem Relation Age of Onset  . Heart disease Father   . Diabetes Father   . Hyperlipidemia Father   . Hypertension Father   . Diabetes Other   . Arthritis Other   . Dementia Other   . Cancer Other     2 uncles with lung cancer  . Heart disease Other     2 uncles with heart disease  . Cancer Mother     breast  . Hyperlipidemia Mother   . Lupus Maternal Aunt   . Lupus Cousin     mother and father's side    Social history:  reports that she has never smoked. She has never used smokeless tobacco. She reports that she does not drink alcohol or use illicit drugs.    Allergies  Allergen Reactions  . Augmentin [Amoxicillin-Pot Clavulanate] Rash and Other (See Comments)    Pt stated "throat felt funny"  . Lipitor [Atorvastatin] Other (See Comments)    myalgia  .  Lovastatin Other (See Comments)    myalgias  . Statins Nausea And Vomiting  . Topamax Other (See Comments)    dizziness    Medications:  Prior to Admission medications   Medication Sig Start Date End Date Taking? Authorizing Provider  brimonidine-timolol (COMBIGAN) 0.2-0.5 % ophthalmic solution Place 1 drop into both eyes every 12 (twelve) hours.   Yes Historical Provider, MD  cholecalciferol (VITAMIN D) 1000 UNITS tablet Take 1,000 Units by mouth daily.   Yes Historical Provider, MD  cyclobenzaprine (FLEXERIL) 5 MG tablet Take 1 tablet (5 mg total) by mouth 3  (three) times daily as needed for muscle spasms. 01/03/15  Yes Biagio Borg, MD  ibuprofen (ADVIL,MOTRIN) 200 MG tablet Take 400 mg by mouth every 6 (six) hours as needed for fever, headache or mild pain.   Yes Historical Provider, MD  metroNIDAZOLE (FLAGYL) 500 MG tablet Take 1 tablet (500 mg total) by mouth 2 (two) times daily. 05/08/15  Yes Larene Pickett, PA-C  NITROSTAT 0.4 MG SL tablet Place 1 tablet under the tongue every 15 (fifteen) minutes as needed for chest pain.  12/20/13  Yes Historical Provider, MD  ranitidine (ZANTAC) 75 MG tablet Take 150 mg by mouth daily as needed for heartburn.    Yes Historical Provider, MD  simvastatin (ZOCOR) 40 MG tablet Take 1 tablet (40 mg total) by mouth daily. 01/03/15  Yes Biagio Borg, MD  SUMAtriptan (IMITREX) 100 MG tablet Take 1 tablet (100 mg total) by mouth 2 (two) times daily as needed for migraine. 03/27/15  Yes Kathrynn Ducking, MD  nortriptyline (PAMELOR) 10 MG capsule Take one capsule at night for one week, then take 2 capsules at night for one week, then take 3 capsules at night 05/30/15   Kathrynn Ducking, MD    ROS:  Out of a complete 14 system review of symptoms, the patient complains only of the following symptoms, and all other reviewed systems are negative.  Fatigue Ear pain Light sensitivity, double vision Neck pain, neck stiffness Headache, dizziness, weakness  Blood pressure 120/82, pulse 60, height 5\' 4"  (1.626 m), weight 221 lb (100.245 kg).  Physical Exam  General: The patient is alert and cooperative at the time of the examination.  Skin: No significant peripheral edema is noted.   Neurologic Exam  Mental status: The patient is alert and oriented x 3 at the time of the examination. The patient has apparent normal recent and remote memory, with an apparently normal attention span and concentration ability.   Cranial nerves: Facial symmetry is present. Speech is normal, no aphasia or dysarthria is noted. Extraocular  movements are full. Visual fields are full.  Motor: The patient has good strength in all 4 extremities.  Sensory examination: Soft touch sensation is symmetric on the face, arms, and legs.  Coordination: The patient has good finger-nose-finger and heel-to-shin bilaterally.  Gait and station: The patient has a normal gait. Tandem gait is normal. Romberg is negative. No drift is seen.  Reflexes: Deep tendon reflexes are symmetric.   Assessment/Plan:  1. Classic migraine headache  The patient is having episodes of visual phenomenon quite consistent with classic migraine. The patient has had onset of symptoms with coming off of propranolol. She will be started on nortriptyline at this time, working up slowly on the dose. She will follow-up in November 2016.  Jill Alexanders MD 05/30/2015 8:07 PM  [facility] [address] [place], [place] [address]  Phone [phone]  Fax [phone]

## 2015-05-30 NOTE — Telephone Encounter (Signed)
Done hardcopy to Dahlia  

## 2015-05-31 ENCOUNTER — Ambulatory Visit: Payer: Self-pay | Admitting: Neurology

## 2015-05-31 NOTE — Telephone Encounter (Signed)
Rx faxed to pharmacy  

## 2015-06-05 ENCOUNTER — Other Ambulatory Visit: Payer: Self-pay | Admitting: Internal Medicine

## 2015-06-05 NOTE — Telephone Encounter (Signed)
xanax denied, just done sept 21

## 2015-06-06 ENCOUNTER — Telehealth: Payer: Self-pay | Admitting: Internal Medicine

## 2015-06-06 NOTE — Telephone Encounter (Signed)
Pt called in and would like to get a referral to Neurology   Best number [phone]

## 2015-06-06 NOTE — Telephone Encounter (Signed)
I dont mean to be difficult, but pt has just seen Dr Jannifer Franklin, neurology sept 21; does she really need another referral?

## 2015-06-07 NOTE — Telephone Encounter (Signed)
Called pt, no voicemail available.

## 2015-06-08 NOTE — Telephone Encounter (Signed)
Left message on machine for pt to return my call  

## 2015-06-27 DIAGNOSIS — H401232 Low-tension glaucoma, bilateral, moderate stage: Secondary | ICD-10-CM | POA: Diagnosis not present

## 2015-07-31 ENCOUNTER — Other Ambulatory Visit: Payer: Self-pay

## 2015-07-31 ENCOUNTER — Ambulatory Visit (INDEPENDENT_AMBULATORY_CARE_PROVIDER_SITE_OTHER): Payer: BLUE CROSS/BLUE SHIELD | Admitting: Adult Health

## 2015-07-31 ENCOUNTER — Encounter: Payer: Self-pay | Admitting: Adult Health

## 2015-07-31 VITALS — BP 106/75 | HR 81 | Ht 64.0 in | Wt 227.0 lb

## 2015-07-31 DIAGNOSIS — G43119 Migraine with aura, intractable, without status migrainosus: Secondary | ICD-10-CM

## 2015-07-31 MED ORDER — GABAPENTIN 100 MG PO CAPS: 100.0000 mg | ORAL_CAPSULE | Freq: Every day | ORAL | Status: DC

## 2015-07-31 MED ORDER — ALPRAZOLAM 0.5 MG PO TABS: ORAL_TABLET | ORAL | Status: DC

## 2015-07-31 NOTE — Telephone Encounter (Signed)
Rx faxed to pharmacy  

## 2015-07-31 NOTE — Telephone Encounter (Signed)
Done hardcopy to Dahlia  

## 2015-07-31 NOTE — Progress Notes (Addendum)
PATIENT: [name] DOB: 1966/03/02  REASON FOR VISIT: follow up- migraine headaches HISTORY FROM: patient  HISTORY OF PRESENT ILLNESS: [name] is a 49 year old female with a history of migraine headaches. She returns today for follow-up. The patient was tried on nortriptyline however she was unable to tolerate this due to hallucinations. She states that she was seeing shapes and objects in the corner of her room. She states that when she discontinued the medication the hallucinations stopped. Patient states that she typically has one headache a week. The headache type can vary. However they are always located on the left side of the head. She states that occasionally it'll start in the left temporal region and then migrate to the left ear. She states that she will have sharp stabbing pains in the ear. Then the headache may migrate to the left side of the neck. She states that when she takes Imitrex she does get some benefit from this medication. She states that she does not like to take medication and for that reason she would  like to refrain from taking something daily. The patient also states that she has a lot of stomach issues. She states that occasionally she'll have burning in the esophagus when she eats food. She also had a period of time where she had a choking feeling but that has resolved. She's had an upper endoscopy that was relatively unremarkable according to the patient. She returns today for an evaluation.  HISTORY 05/30/15: [name] is a 49 year old right-handed black female with a history of migraine headache. The patient has begun having events approximately 6 weeks ago. The patient had been on propranolol, but she stopped the medication as there were concerns about low blood pressure. Shortly after coming off of the propranolol, the patient began having episodes of visual changes associated with fortification spectra with bright lights and a central scotoma. The episodes  would start in one visual field, and then cross midline to the other visual field, affecting both eyes. The patient would have visual symptoms for about 20 or 25 minutes, the vision was clear up, and a mild headache may ensue. The patient has had frequent events, almost daily at this point. The patient has taken Imitrex, but more recently she has had some generalized burning sensations associated with the medication. She has become quite concerned about the visual changes, she has been to the emergency room on 3 occasions since 05/08/2015, the last visit was on 05/24/2015. MRI of the brain was done that time, this was reviewed online and appears to show nonspecific punctate changes in the brain with good stability from the prior study. The lesions are consistent with a history of migraine headache. There has been some question of demyelinating disease in the past. The patient returns this office for further evaluation. She indicates that with one of the headaches she had some sensation of numbness on the right base and some question small weakness on the right side.  REVIEW OF SYSTEMS: Out of a complete 14 system review of symptoms, the patient complains only of the following symptoms, and all other reviewed systems are negative.  Fatigue, ear pain, nausea, neck pain, neck stiffness, dizziness, headache, numbness, weakness  ALLERGIES: Allergies  Allergen Reactions  . Augmentin [Amoxicillin-Pot Clavulanate] Rash and Other (See Comments)    Pt stated "throat felt funny"  . Lipitor [Atorvastatin] Other (See Comments)    myalgia  . Lovastatin Other (See Comments)    myalgias  . Statins  Nausea And Vomiting  . Topamax Other (See Comments)    dizziness    HOME MEDICATIONS: Outpatient Prescriptions Prior to Visit  Medication Sig Dispense Refill  . ALPRAZolam (XANAX) 0.5 MG tablet TAKE 1/2 TABLET BY MOUTH TWICE DAILY AS NEEDED FOR ANXIETY 60 tablet 1  . brimonidine-timolol (COMBIGAN) 0.2-0.5 %  ophthalmic solution Place 1 drop into both eyes every 12 (twelve) hours.    . cholecalciferol (VITAMIN D) 1000 UNITS tablet Take 1,000 Units by mouth daily.    . cyclobenzaprine (FLEXERIL) 5 MG tablet Take 1 tablet (5 mg total) by mouth 3 (three) times daily as needed for muscle spasms. 60 tablet 2  . ibuprofen (ADVIL,MOTRIN) 200 MG tablet Take 400 mg by mouth every 6 (six) hours as needed for fever, headache or mild pain.    . metroNIDAZOLE (FLAGYL) 500 MG tablet Take 1 tablet (500 mg total) by mouth 2 (two) times daily. 28 tablet 0  . NITROSTAT 0.4 MG SL tablet Place 1 tablet under the tongue every 15 (fifteen) minutes as needed for chest pain.     . ranitidine (ZANTAC) 75 MG tablet Take 150 mg by mouth daily as needed for heartburn.     . simvastatin (ZOCOR) 40 MG tablet Take 1 tablet (40 mg total) by mouth daily. 90 tablet 3  . SUMAtriptan (IMITREX) 100 MG tablet Take 1 tablet (100 mg total) by mouth 2 (two) times daily as needed for migraine. 10 tablet 2  . nortriptyline (PAMELOR) 10 MG capsule Take one capsule at night for one week, then take 2 capsules at night for one week, then take 3 capsules at night 90 capsule 3   No facility-administered medications prior to visit.    PAST MEDICAL HISTORY: Past Medical History  Diagnosis Date  . HYPERLIPIDEMIA 03/27/2010  . ANXIETY 03/27/2010  . COMMON MIGRAINE 03/27/2010  . GERD 03/27/2010  . [place] DISEASE, CERVICAL 03/27/2010  . BACK PAIN, CHRONIC 03/27/2010  . FATIGUE 03/27/2010  . Unspecified vitamin D deficiency 10/23/2010  . Multiple sclerosis ([place]) 10/23/2010    unconfirmed, but suspected  . Osteoporosis 09/24/2012  . Glaucoma     bilateral  . Classic migraine with aura 05/30/2015    PAST SURGICAL HISTORY: Past Surgical History  Procedure Laterality Date  . Abdominal hysterectomy    . S/p knee surgury    . S/p eye surgury    . Neg stress test  2007  . Left heart catheterization with coronary angiogram N/A 03/31/2014    Procedure: LEFT  HEART CATHETERIZATION WITH CORONARY ANGIOGRAM;  Surgeon: Pixie Casino, MD;  Location: [facility] CATH LAB;  Service: Cardiovascular;  Laterality: N/A;    FAMILY HISTORY: Family History  Problem Relation Age of Onset  . Heart disease Father   . Diabetes Father   . Hyperlipidemia Father   . Hypertension Father   . Diabetes Other   . Arthritis Other   . Dementia Other   . Cancer Other     2 uncles with lung cancer  . Heart disease Other     2 uncles with heart disease  . Cancer Mother     breast  . Hyperlipidemia Mother   . Lupus Maternal Aunt   . Lupus Cousin     mother and father's side    SOCIAL HISTORY: Social History   Social History  . Marital Status: Married    Spouse Name: [place]  . Number of Children: 1  . Years of Education: College   Occupational History  .  Disabled      due to "MS" since 2005   Social History Main Topics  . Smoking status: Never Smoker   . Smokeless tobacco: Never Used  . Alcohol Use: No  . Drug Use: No  . Sexual Activity: Not on file   Other Topics Concern  . Not on file   Social History Narrative   Lives with her husband and son.   Patient is right handed.   Patient does not drink caffeine.      PHYSICAL EXAM  Filed Vitals:   07/31/15 0810  BP: 106/75  Pulse: 81  Height: 5\' 4"  (1.626 m)  Weight: 227 lb (102.967 kg)   Body mass index is 38.95 kg/(m^2).  Generalized: Well developed, in no acute distress   Neurological examination  Mentation: Alert oriented to time, place, history taking. Follows all commands speech and language fluent Cranial nerve II-XII: Left pupil  equal round reactive to light. 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 Extraocular movements were full, visual field were full on confrontational test. Facial sensation and strength were normal. Uvula tongue midline. Head turning and shoulder shrug  were normal and symmetric. Motor: The motor testing reveals 5 over 5 strength of  all 4 extremities. Good symmetric motor tone is noted throughout.  Sensory: Sensory testing is intact to soft touch on all 4 extremities. No evidence of extinction is noted.  Coordination: Cerebellar testing reveals good finger-nose-finger and heel-to-shin bilaterally.  Gait and station: Gait is normal. Tandem gait is normal. Romberg is negative. No drift is seen.  Reflexes: Deep tendon reflexes are symmetric and normal bilaterally.   DIAGNOSTIC DATA (LABS, IMAGING, TESTING) - I reviewed patient records, labs, notes, testing and imaging myself where available.  Lab Results  Component Value Date   WBC 7.5 05/24/2015   HGB 13.4 05/24/2015   HCT 39.9 05/24/2015   MCV 91.7 05/24/2015   PLT 302 05/24/2015      Component Value Date/Time   NA 136 05/24/2015 0415   K 4.0 05/24/2015 0415   CL 104 05/24/2015 0415   CO2 26 05/24/2015 0415   GLUCOSE 107* 05/24/2015 0415   BUN 9 05/24/2015 0415   CREATININE 0.55 05/24/2015 0415   CREATININE 0.67 03/28/2014 1524   CALCIUM 9.1 05/24/2015 0415   CALCIUM 9.1 08/04/2012 1652   PROT 8.0 05/24/2015 0415   ALBUMIN 4.0 05/24/2015 0415   AST 27 05/24/2015 0415   ALT 19 05/24/2015 0415   ALKPHOS 69 05/24/2015 0415   BILITOT 0.5 05/24/2015 0415   GFRNONAA >60 05/24/2015 0415   GFRAA >60 05/24/2015 0415   Lab Results  Component Value Date   CHOL 199 01/03/2015   HDL 50.40 01/03/2015   LDLCALC 133* 01/03/2015   LDLDIRECT 184.9 08/04/2012   TRIG 76.0 01/03/2015   CHOLHDL 4 01/03/2015    Lab Results  Component Value Date   VITAMINB12 1339* 12/07/2014   Lab Results  Component Value Date   TSH 0.67 01/03/2015      ASSESSMENT AND PLAN 49 y.o. year old female  has a past medical history of HYPERLIPIDEMIA (03/27/2010); ANXIETY (03/27/2010); COMMON MIGRAINE (03/27/2010); GERD (03/27/2010); DISC DISEASE, CERVICAL (03/27/2010); BACK PAIN, CHRONIC (03/27/2010); FATIGUE (03/27/2010); Unspecified vitamin D deficiency (10/23/2010); Multiple sclerosis  ([place]) (10/23/2010); Osteoporosis (09/24/2012); Glaucoma; and Classic migraine with aura (05/30/2015). here with:  1. Migraine headaches  I explained to the patient the purpose of preventative medication for migraine headaches. She verbalized understanding and is amenable to trying a  medication daily. I will try the patient on a low-dose of gabapentin. She will begin taking 100 mg at bedtime. I have reviewed the side effects with the patient and provided her with a handout. Patient advised that if she is unable to tolerate this medication she should let us know. She will follow-up in 3-4 months or sooner if needed.   Ward Givens, MSN, NP-C 07/31/2015, 8:26 AM [facility] [address], [place], [place] [address] [phone]   Personally participated in and made any corrections needed to history, physical, neuro exam,assessment and plan as stated above.  I have personally obtained the history, evaluated lab date, reviewed imaging studies and agree with radiology interpretations.    Sarina Ill, MD [facility]

## 2015-07-31 NOTE — Patient Instructions (Signed)
Try Gabapentin 100 mg at bedtime for headache If unable to tolerate please let me know If your symptoms worsen or you develop new symptoms please let us know.   Gabapentin capsules or tablets What is this medicine? GABAPENTIN (GA ba pen tin) is used to control partial seizures in adults with epilepsy. It is also used to treat certain types of nerve pain. This medicine may be used for other purposes; ask your health care provider or pharmacist if you have questions. What should I tell my health care provider before I take this medicine? They need to know if you have any of these conditions: -kidney disease -suicidal thoughts, plans, or attempt; a previous suicide attempt by you or a family member -an unusual or allergic reaction to gabapentin, other medicines, foods, dyes, or preservatives -pregnant or trying to get pregnant -breast-feeding How should I use this medicine? Take this medicine by mouth with a glass of water. Follow the directions on the prescription label. You can take it with or without food. If it upsets your stomach, take it with food.Take your medicine at regular intervals. Do not take it more often than directed. Do not stop taking except on your doctor's advice. If you are directed to break the 600 or 800 mg tablets in half as part of your dose, the extra half tablet should be used for the next dose. If you have not used the extra half tablet within 28 days, it should be thrown away. A special MedGuide will be given to you by the pharmacist with each prescription and refill. Be sure to read this information carefully each time. Talk to your pediatrician regarding the use of this medicine in children. Special care may be needed. Overdosage: If you think you have taken too much of this medicine contact a poison control center or emergency room at once. NOTE: This medicine is only for you. Do not share this medicine with others. What if I miss a dose? If you miss a dose, take  it as soon as you can. If it is almost time for your next dose, take only that dose. Do not take double or extra doses. What may interact with this medicine? Do not take this medicine with any of the following medications: -other gabapentin products This medicine may also interact with the following medications: -alcohol -antacids -antihistamines for allergy, cough and cold -certain medicines for anxiety or sleep -certain medicines for depression or psychotic disturbances -homatropine; hydrocodone -naproxen -narcotic medicines (opiates) for pain -phenothiazines like chlorpromazine, mesoridazine, prochlorperazine, thioridazine This list may not describe all possible interactions. Give your health care provider a list of all the medicines, herbs, non-prescription drugs, or dietary supplements you use. Also tell them if you smoke, drink alcohol, or use illegal drugs. Some items may interact with your medicine. What should I watch for while using this medicine? Visit your doctor or health care professional for regular checks on your progress. You may want to keep a record at home of how you feel your condition is responding to treatment. You may want to share this information with your doctor or health care professional at each visit. You should contact your doctor or health care professional if your seizures get worse or if you have any new types of seizures. Do not stop taking this medicine or any of your seizure medicines unless instructed by your doctor or health care professional. Stopping your medicine suddenly can increase your seizures or their severity. Wear a medical identification bracelet or  chain if you are taking this medicine for seizures, and carry a card that lists all your medications. You may get drowsy, dizzy, or have blurred vision. Do not drive, use machinery, or do anything that needs mental alertness until you know how this medicine affects you. To reduce dizzy or fainting  spells, do not sit or stand up quickly, especially if you are an older patient. Alcohol can increase drowsiness and dizziness. Avoid alcoholic drinks. Your mouth may get dry. Chewing sugarless gum or sucking hard candy, and drinking plenty of water will help. The use of this medicine may increase the chance of suicidal thoughts or actions. Pay special attention to how you are responding while on this medicine. Any worsening of mood, or thoughts of suicide or dying should be reported to your health care professional right away. Women who become pregnant while using this medicine may enroll in the [place] Pregnancy Registry by calling [phone]. This registry collects information about the safety of antiepileptic drug use during pregnancy. What side effects may I notice from receiving this medicine? Side effects that you should report to your doctor or health care professional as soon as possible: -allergic reactions like skin rash, itching or hives, swelling of the face, lips, or tongue -worsening of mood, thoughts or actions of suicide or dying Side effects that usually do not require medical attention (report to your doctor or health care professional if they continue or are bothersome): -constipation -difficulty walking or controlling muscle movements -dizziness -nausea -slurred speech -tiredness -tremors -weight gain This list may not describe all possible side effects. Call your doctor for medical advice about side effects. You may report side effects to FDA at 1-800-FDA-1088. Where should I keep my medicine? Keep out of reach of children. This medicine may cause accidental overdose and death if it taken by other adults, children, or pets. Mix any unused medicine with a substance like cat litter or coffee grounds. Then throw the medicine away in a sealed container like a sealed bag or a coffee can with a lid. Do not use the medicine after the expiration  date. Store at room temperature between 15 and 30 degrees C (59 and 86 degrees F). NOTE: This sheet is a summary. It may not cover all possible information. If you have questions about this medicine, talk to your doctor, pharmacist, or health care provider.    2016, [employer]vier/[employer] Standard. (2013-10-21 15:26:50)

## 2015-10-01 DIAGNOSIS — H401112 Primary open-angle glaucoma, right eye, moderate stage: Secondary | ICD-10-CM | POA: Diagnosis not present

## 2015-10-01 DIAGNOSIS — H43392 Other vitreous opacities, left eye: Secondary | ICD-10-CM | POA: Diagnosis not present

## 2015-10-05 DIAGNOSIS — Z803 Family history of malignant neoplasm of breast: Secondary | ICD-10-CM | POA: Diagnosis not present

## 2015-10-05 DIAGNOSIS — Z1231 Encounter for screening mammogram for malignant neoplasm of breast: Secondary | ICD-10-CM | POA: Diagnosis not present

## 2015-10-05 LAB — HM MAMMOGRAPHY

## 2015-10-09 ENCOUNTER — Encounter: Payer: Self-pay | Admitting: Internal Medicine

## 2015-10-15 DIAGNOSIS — H43812 Vitreous degeneration, left eye: Secondary | ICD-10-CM | POA: Diagnosis not present

## 2015-10-26 ENCOUNTER — Telehealth: Payer: Self-pay | Admitting: *Deleted

## 2015-10-26 MED ORDER — OSELTAMIVIR PHOSPHATE 75 MG PO CAPS: 75.0000 mg | ORAL_CAPSULE | Freq: Two times a day (BID) | ORAL | Status: DC

## 2015-10-26 NOTE — Telephone Encounter (Signed)
Left msg on triage stating her husband has been dx with the flu, and she was told to contact her PCP to have a rx for Tamiflu sent to pharmacy for preventive...[name]

## 2015-10-26 NOTE — Telephone Encounter (Signed)
Ok, done erx 

## 2015-10-26 NOTE — Telephone Encounter (Signed)
Notified pt with md response.../lmb 

## 2015-10-28 ENCOUNTER — Emergency Department (HOSPITAL_COMMUNITY): Payer: BLUE CROSS/BLUE SHIELD

## 2015-10-28 ENCOUNTER — Encounter (HOSPITAL_COMMUNITY): Payer: Self-pay | Admitting: Emergency Medicine

## 2015-10-28 ENCOUNTER — Emergency Department (HOSPITAL_COMMUNITY)
Admission: EM | Admit: 2015-10-28 | Discharge: 2015-10-28 | Disposition: A | Discharge status: Home / Self Care | Payer: BLUE CROSS/BLUE SHIELD | Attending: Emergency Medicine | Admitting: Emergency Medicine

## 2015-10-28 DIAGNOSIS — F419 Anxiety disorder, unspecified: Secondary | ICD-10-CM | POA: Diagnosis not present

## 2015-10-28 DIAGNOSIS — E785 Hyperlipidemia, unspecified: Secondary | ICD-10-CM | POA: Diagnosis not present

## 2015-10-28 DIAGNOSIS — R0789 Other chest pain: Secondary | ICD-10-CM | POA: Insufficient documentation

## 2015-10-28 DIAGNOSIS — K219 Gastro-esophageal reflux disease without esophagitis: Secondary | ICD-10-CM | POA: Insufficient documentation

## 2015-10-28 DIAGNOSIS — E559 Vitamin D deficiency, unspecified: Secondary | ICD-10-CM | POA: Diagnosis not present

## 2015-10-28 DIAGNOSIS — G43909 Migraine, unspecified, not intractable, without status migrainosus: Secondary | ICD-10-CM | POA: Insufficient documentation

## 2015-10-28 DIAGNOSIS — M818 Other osteoporosis without current pathological fracture: Secondary | ICD-10-CM | POA: Diagnosis not present

## 2015-10-28 DIAGNOSIS — R5383 Other fatigue: Secondary | ICD-10-CM | POA: Insufficient documentation

## 2015-10-28 DIAGNOSIS — R0602 Shortness of breath: Secondary | ICD-10-CM | POA: Diagnosis not present

## 2015-10-28 DIAGNOSIS — R079 Chest pain, unspecified: Secondary | ICD-10-CM | POA: Diagnosis present

## 2015-10-28 DIAGNOSIS — Z79899 Other long term (current) drug therapy: Secondary | ICD-10-CM | POA: Insufficient documentation

## 2015-10-28 DIAGNOSIS — G8929 Other chronic pain: Secondary | ICD-10-CM | POA: Insufficient documentation

## 2015-10-28 LAB — CBC
HEMATOCRIT: 36.5 % (ref 36.0–46.0)
HEMOGLOBIN: 11.9 g/dL — AB (ref 12.0–15.0)
MCH: 30.4 pg (ref 26.0–34.0)
MCHC: 32.6 g/dL (ref 30.0–36.0)
MCV: 93.4 fL (ref 78.0–100.0)
Platelets: 273 10*3/uL (ref 150–400)
RBC: 3.91 MIL/uL (ref 3.87–5.11)
RDW: 14.5 % (ref 11.5–15.5)
WBC: 6.1 10*3/uL (ref 4.0–10.5)

## 2015-10-28 LAB — BASIC METABOLIC PANEL
ANION GAP: 6 (ref 5–15)
BUN: 10 mg/dL (ref 6–20)
CHLORIDE: 109 mmol/L (ref 101–111)
CO2: 23 mmol/L (ref 22–32)
CREATININE: 0.63 mg/dL (ref 0.44–1.00)
Calcium: 8.8 mg/dL — ABNORMAL LOW (ref 8.9–10.3)
GFR calc non Af Amer: >60 mL/min (ref >60)
Glucose, Bld: 103 mg/dL — ABNORMAL HIGH (ref 65–99)
POTASSIUM: 4.1 mmol/L (ref 3.5–5.1)
Sodium: 138 mmol/L (ref 135–145)

## 2015-10-28 LAB — I-STAT TROPONIN, ED: Troponin i, poc: 0 ng/mL (ref 0.00–0.08)

## 2015-10-28 LAB — D-DIMER, QUANTITATIVE (NOT AT ARMC): D DIMER QUANT: 0.72 ug{FEU}/mL — AB (ref 0.00–0.50)

## 2015-10-28 MED ORDER — ONDANSETRON HCL 4 MG/2ML IJ SOLN
4.0000 mg | Freq: Once | INTRAMUSCULAR | Status: AC
  Administered 2015-10-28: 4 mg via INTRAVENOUS
  Filled 2015-10-28: qty 2

## 2015-10-28 MED ORDER — NAPROXEN 500 MG PO TABS: 500.0000 mg | ORAL_TABLET | Freq: Two times a day (BID) | ORAL | Status: DC

## 2015-10-28 MED ORDER — SODIUM CHLORIDE 0.9 % IV BOLUS (SEPSIS)
500.0000 mL | Freq: Once | INTRAVENOUS | Status: AC
Start: 2015-10-28 — End: 2015-10-28
  Administered 2015-10-28: 12:00:00 via INTRAVENOUS

## 2015-10-28 MED ORDER — IOHEXOL 350 MG/ML SOLN
100.0000 mL | Freq: Once | INTRAVENOUS | Status: AC | PRN
  Administered 2015-10-28: 100 mL via INTRAVENOUS

## 2015-10-28 MED ORDER — SODIUM CHLORIDE 0.9 % IV SOLN: INTRAVENOUS | Status: DC

## 2015-10-28 NOTE — ED Notes (Addendum)
Pt reported nonradiating chest pain since yesterday with exertion with SHOB without dizziness/lightheadedness, n/v or diaphoresis. Pt denies heavy lifting and coughing.

## 2015-10-28 NOTE — ED Notes (Signed)
Intermittent chest pain starting yesterday worsening into today. Feeling more fatigued and SOB with activities of daily living. Denies nausea, vomiting, back pain.

## 2015-10-28 NOTE — ED Provider Notes (Signed)
CSN: [id number]     Arrival date & time 10/28/15  [id number] History   First MD Initiated Contact with Patient 10/28/15 0915     Chief Complaint  Patient presents with  . Chest Pain  . Shortness of Breath  . Fatigue     (Consider location/radiation/quality/duration/timing/severity/associated sxs/prior Treatment) Patient is a 51 y.o. female presenting with chest pain and shortness of breath. The history is provided by the patient.  Chest Pain Associated symptoms: shortness of breath   Associated symptoms: no abdominal pain, no back pain, no diaphoresis, no fever, no headache, no nausea and not vomiting   Shortness of Breath Associated symptoms: chest pain   Associated symptoms: no abdominal pain, no diaphoresis, no fever, no headaches, no neck pain, no rash and no vomiting    patient with onset of left-sided chest pain yesterday intermittent throughout the day. Starting at 8 in the evening became constant and was rated 8 out of 10. Not associated with nausea vomiting diaphoresis or fever or leg swelling. No upper respiratory symptom infection. Patient did have intermittent short of breath. Patient's chest pain worse with movement and taking a breath.  Past Medical History  Diagnosis Date  . HYPERLIPIDEMIA 03/27/2010  . ANXIETY 03/27/2010  . COMMON MIGRAINE 03/27/2010  . GERD 03/27/2010  . [place] DISEASE, CERVICAL 03/27/2010  . BACK PAIN, CHRONIC 03/27/2010  . FATIGUE 03/27/2010  . Unspecified vitamin D deficiency 10/23/2010  . Multiple sclerosis ([place]) 10/23/2010    unconfirmed, but suspected  . Osteoporosis 09/24/2012  . Glaucoma     bilateral  . Classic migraine with aura 05/30/2015   Past Surgical History  Procedure Laterality Date  . Abdominal hysterectomy    . S/p knee surgury    . S/p eye surgury    . Neg stress test  2007  . Left heart catheterization with coronary angiogram N/A 03/31/2014    Procedure: LEFT HEART CATHETERIZATION WITH CORONARY ANGIOGRAM;  Surgeon: Pixie Casino, MD;   Location: [facility] CATH LAB;  Service: Cardiovascular;  Laterality: N/A;   Family History  Problem Relation Age of Onset  . Heart disease Father   . Diabetes Father   . Hyperlipidemia Father   . Hypertension Father   . Diabetes Other   . Arthritis Other   . Dementia Other   . Cancer Other     2 uncles with lung cancer  . Heart disease Other     2 uncles with heart disease  . Cancer Mother     breast  . Hyperlipidemia Mother   . Lupus Maternal Aunt   . Lupus Cousin     mother and father's side   Social History  Substance Use Topics  . Smoking status: Never Smoker   . Smokeless tobacco: Never Used  . Alcohol Use: No   OB History    No data available     Review of Systems  Constitutional: Negative for fever and diaphoresis.  HENT: Negative for congestion.   Eyes: Negative for visual disturbance.  Respiratory: Positive for shortness of breath.   Cardiovascular: Positive for chest pain.  Gastrointestinal: Negative for nausea, vomiting and abdominal pain.  Genitourinary: Negative for dysuria.  Musculoskeletal: Negative for back pain and neck pain.  Skin: Negative for rash.  Neurological: Negative for headaches.  Hematological: Does not bruise/bleed easily.  Psychiatric/Behavioral: Negative for confusion.      Allergies  Augmentin; Lipitor; Lovastatin; Statins; and Topamax  Home Medications   Prior to Admission medications   Medication Sig  Start Date End Date Taking? Authorizing Provider  ALPRAZolam Duanne Moron) 0.5 MG tablet TAKE 1/2 TABLET BY MOUTH TWICE DAILY AS NEEDED FOR ANXIETY 07/31/15  Yes Biagio Borg, MD  brimonidine-timolol (COMBIGAN) 0.2-0.5 % ophthalmic solution Place 1 drop into both eyes every 12 (twelve) hours.   Yes Historical Provider, MD  cholecalciferol (VITAMIN D) 1000 UNITS tablet Take 1,000 Units by mouth daily.   Yes Historical Provider, MD  cyclobenzaprine (FLEXERIL) 5 MG tablet Take 1 tablet (5 mg total) by mouth 3 (three) times daily as needed for  muscle spasms. 01/03/15  Yes Biagio Borg, MD  gabapentin (NEURONTIN) 100 MG capsule Take 1 capsule (100 mg total) by mouth at bedtime. Patient taking differently: Take 100 mg by mouth at bedtime as needed (nerve pain.).  07/31/15  Yes Ward Givens, NP  ibuprofen (ADVIL,MOTRIN) 200 MG tablet Take 400 mg by mouth every 6 (six) hours as needed for fever, headache or mild pain.   Yes Historical Provider, MD  NITROSTAT 0.4 MG SL tablet Place 1 tablet under the tongue every 15 (fifteen) minutes as needed for chest pain.  12/20/13  Yes Historical Provider, MD  oseltamivir (TAMIFLU) 75 MG capsule Take 1 capsule (75 mg total) by mouth 2 (two) times daily. 10/26/15  Yes Biagio Borg, MD  ranitidine (ZANTAC) 75 MG tablet Take 150 mg by mouth daily as needed for heartburn.    Yes Historical Provider, MD  simvastatin (ZOCOR) 40 MG tablet Take 1 tablet (40 mg total) by mouth daily. 01/03/15  Yes Biagio Borg, MD  SUMAtriptan (IMITREX) 100 MG tablet Take 1 tablet (100 mg total) by mouth 2 (two) times daily as needed for migraine. 03/27/15  Yes Kathrynn Ducking, MD  metroNIDAZOLE (FLAGYL) 500 MG tablet Take 1 tablet (500 mg total) by mouth 2 (two) times daily. Patient not taking: Reported on 10/28/2015 05/08/15   Larene Pickett, PA-C  naproxen (NAPROSYN) 500 MG tablet Take 1 tablet (500 mg total) by mouth 2 (two) times daily. 10/28/15   Fredia Sorrow, MD   BP 119/78 mmHg  Pulse 64  Temp(Src) 97.6 F (36.4 C) (Oral)  Resp 19  SpO2 100% Physical Exam  Constitutional: She is oriented to person, place, and time. She appears well-developed and well-nourished. No distress.  HENT:  Head: Normocephalic and atraumatic.  Mouth/Throat: Oropharynx is clear and moist.  Eyes: Conjunctivae and EOM are normal. Pupils are equal, round, and reactive to light.  Neck: Normal range of motion. Neck supple.  Cardiovascular: Normal rate, regular rhythm and normal heart sounds.   No murmur heard. Pulmonary/Chest: Effort normal and  breath sounds normal. No respiratory distress. She has no wheezes. She exhibits no tenderness.  Abdominal: Soft. Bowel sounds are normal. There is no tenderness.  Musculoskeletal: Normal range of motion. She exhibits no edema.  Neurological: She is alert and oriented to person, place, and time. No cranial nerve deficit. She exhibits normal muscle tone. Coordination normal.  Skin: Skin is warm. No rash noted.  Nursing note and vitals reviewed.   ED Course  Procedures (including critical care time) Labs Review Labs Reviewed  BASIC METABOLIC PANEL - Abnormal; Notable for the following:    Glucose, Bld 103 (*)    Calcium 8.8 (*)    All other components within normal limits  CBC - Abnormal; Notable for the following:    Hemoglobin 11.9 (*)    All other components within normal limits  D-DIMER, QUANTITATIVE (NOT AT [facility]) - Abnormal; Notable for  the following:    D-Dimer, Quant 0.72 (*)    All other components within normal limits  I-STAT TROPOININ, ED   Results for orders placed or performed during the hospital encounter of [id number]  Basic metabolic panel  Result Value Ref Range   Sodium 138 135 - 145 mmol/L   Potassium 4.1 3.5 - 5.1 mmol/L   Chloride 109 101 - 111 mmol/L   CO2 23 22 - 32 mmol/L   Glucose, Bld 103 (H) 65 - 99 mg/dL   BUN 10 6 - 20 mg/dL   Creatinine, Ser 0.63 0.44 - 1.00 mg/dL   Calcium 8.8 (L) 8.9 - 10.3 mg/dL   GFR calc non Af Amer >60 >60 mL/min   GFR calc Af Amer >60 >60 mL/min   Anion gap 6 5 - 15  CBC  Result Value Ref Range   WBC 6.1 4.0 - 10.5 K/uL   RBC 3.91 3.87 - 5.11 MIL/uL   Hemoglobin 11.9 (L) 12.0 - 15.0 g/dL   HCT 36.5 36.0 - 46.0 %   MCV 93.4 78.0 - 100.0 fL   MCH 30.4 26.0 - 34.0 pg   MCHC 32.6 30.0 - 36.0 g/dL   RDW 14.5 11.5 - 15.5 %   Platelets 273 150 - 400 K/uL  D-dimer, quantitative (not at [facility])  Result Value Ref Range   D-Dimer, Quant 0.72 (H) 0.00 - 0.50 ug/mL-FEU  I-stat troponin, ED (not at [facility])  Result Value Ref Range    Troponin i, poc 0.00 0.00 - 0.08 ng/mL   Comment 3            Imaging Review Dg Chest 2 View  10/28/2015  CLINICAL DATA:  Pt c/o left sided chest pain and SOB since yesterday. Non smoker. EXAM: CHEST  2 VIEW COMPARISON:  06/12/2009 FINDINGS: The heart size and mediastinal contours are within normal limits. Both lungs are clear. No pleural effusion or pneumothorax. The visualized skeletal structures are unremarkable. IMPRESSION: No active cardiopulmonary disease. Electronically Signed   By: Lajean Manes M.D.   On: 10/28/2015 10:15   Ct Angio Chest Pe W/cm &/or Wo Cm  10/28/2015  CLINICAL DATA:  Evaluate for pulmonary embolus. Intermittent chest pain began yesterday and is worse today. Fatigue and shortness of breath. EXAM: CT ANGIOGRAPHY CHEST WITH CONTRAST TECHNIQUE: Multidetector CT imaging of the chest was performed using the standard protocol during bolus administration of intravenous contrast. Multiplanar CT image reconstructions and MIPs were obtained to evaluate the vascular anatomy. CONTRAST:  168mL OMNIPAQUE IOHEXOL 350 MG/ML SOLN COMPARISON:  10/28/2015 FINDINGS: Heart: Under artery calcifications are present. Heart size is normal. Vascular structures: The pulmonary arteries are well opacified. There is no evidence for acute pulmonary embolus. Thoracic aorta is normal in appearance. Mediastinum/thyroid: The visualized portion of the thyroid gland has a normal appearance. No mediastinal, hilar, or axillary adenopathy. Lungs/Airways: No pulmonary nodules, pleural effusions, or infiltrates. Upper abdomen: Unremarkable. Chest wall/osseous structures: Unremarkable. Review of the MIP images confirms the above findings. IMPRESSION: Technically adequate exam showing no acute pulmonary embolus. Coronary artery disease. Electronically Signed   By: Nolon Nations M.D.   On: 10/28/2015 12:22   I have personally reviewed and evaluated these images and lab results as part of my medical  decision-making.   EKG Interpretation   Date/Time:  Sunday October 28 2015 09:12:23 EST Ventricular Rate:  76 PR Interval:  165 QRS Duration: 88 QT Interval:  390 QTC Calculation: 438 R Axis:   -6 Text Interpretation:  Sinus rhythm Low voltage, precordial leads Artifact  No significant change since last tracing Confirmed by Orestes Geiman  MD, Husein Guedes  [phone]) on 10/28/2015 9:33:27 AM      MDM   Final diagnoses:  Chest wall pain   Patient with onset of chest pain yesterday intermittent and then starting at 8:00 in the evening became constant 8 out of 10 left-sided. No nausea no vomiting no diuresis no fever no leg swelling.  From cardiac standpoint EKG without any acute changes. Chest x-ray negative for pneumonia pneumothorax or pulmonary edema. Troponin negative.  D-dimer slightly elevated since patient had the so she is shortness of breath with it although not hypoxic here not tachycardic. CT angios negative. Patient's basic labs without any significant abnormalities.  The left-sided chest pain always worse with breathing and with movement most likely chest wall in nature since no evidence of an acute cardiac event no evidence of any acute pulmonary disease event and also no evidence of any pulmonary embolus. Patient will be treated symptomatically.     Fredia Sorrow, MD 10/28/15 [phone]

## 2015-10-28 NOTE — ED Notes (Signed)
Awake. Verbally responsive. A/O x4. Resp even and unlabored. No audible adventitious breath sounds noted. ABC's intact.  

## 2015-10-28 NOTE — Discharge Instructions (Signed)
Chest Wall Pain Chest wall pain is pain in or around the bones and muscles of your chest. Sometimes, an injury causes this pain. Sometimes, the cause may not be known. This pain may take several weeks or longer to get better. HOME CARE Pay attention to any changes in your symptoms. Take these actions to help with your pain:  Rest as told by your doctor.  Avoid activities that cause pain. Try not to use your chest, belly (abdominal), or side muscles to lift heavy things.  If directed, apply ice to the painful area:  Put ice in a plastic bag.  Place a towel between your skin and the bag.  Leave the ice on for 20 minutes, 2-3 times per day.  Take over-the-counter and prescription medicines only as told by your doctor.  Do not use tobacco products, including cigarettes, chewing tobacco, and e-cigarettes. If you need help quitting, ask your doctor.  Keep all follow-up visits as told by your doctor. This is important. GET HELP IF:  You have a fever.  Your chest pain gets worse.  You have new symptoms. GET HELP RIGHT AWAY IF:  You feel sick to your stomach (nauseous) or you throw up (vomit).  You feel sweaty or light-headed.  You have a cough with phlegm (sputum) or you cough up blood.  You are short of breath.   This information is not intended to replace advice given to you by your health care provider. Make sure you discuss any questions you have with your health care provider.   Return for any new or worse symptoms. Take the Naprosyn as directed. Follow-up with your regular doctor as needed.   Document Released: 02/11/2008 Document Revised: 05/16/2015 Document Reviewed: 11/20/2014 Elsevier Interactive Patient Education [employer].

## 2015-10-28 NOTE — ED Notes (Signed)
Discontinue iv

## 2015-10-31 ENCOUNTER — Ambulatory Visit: Payer: BLUE CROSS/BLUE SHIELD | Admitting: Adult Health

## 2015-12-05 ENCOUNTER — Ambulatory Visit: Payer: BLUE CROSS/BLUE SHIELD | Admitting: Adult Health

## 2015-12-26 DIAGNOSIS — H15841 Scleral ectasia, right eye: Secondary | ICD-10-CM | POA: Diagnosis not present

## 2015-12-26 DIAGNOSIS — H43391 Other vitreous opacities, right eye: Secondary | ICD-10-CM | POA: Diagnosis not present

## 2015-12-26 DIAGNOSIS — H43392 Other vitreous opacities, left eye: Secondary | ICD-10-CM | POA: Diagnosis not present

## 2015-12-26 DIAGNOSIS — H2701 Aphakia, right eye: Secondary | ICD-10-CM | POA: Diagnosis not present

## 2015-12-26 DIAGNOSIS — H353120 Nonexudative age-related macular degeneration, left eye, stage unspecified: Secondary | ICD-10-CM | POA: Diagnosis not present

## 2016-01-04 ENCOUNTER — Encounter: Payer: Self-pay | Admitting: Internal Medicine

## 2016-01-04 ENCOUNTER — Other Ambulatory Visit (INDEPENDENT_AMBULATORY_CARE_PROVIDER_SITE_OTHER): Payer: BLUE CROSS/BLUE SHIELD

## 2016-01-04 ENCOUNTER — Ambulatory Visit (INDEPENDENT_AMBULATORY_CARE_PROVIDER_SITE_OTHER): Payer: BLUE CROSS/BLUE SHIELD | Admitting: Internal Medicine

## 2016-01-04 VITALS — BP 124/80 | HR 70 | Temp 98.5°F | Resp 20 | Wt 219.0 lb

## 2016-01-04 DIAGNOSIS — E785 Hyperlipidemia, unspecified: Secondary | ICD-10-CM | POA: Diagnosis not present

## 2016-01-04 DIAGNOSIS — R6889 Other general symptoms and signs: Secondary | ICD-10-CM

## 2016-01-04 DIAGNOSIS — I2510 Atherosclerotic heart disease of native coronary artery without angina pectoris: Secondary | ICD-10-CM | POA: Insufficient documentation

## 2016-01-04 DIAGNOSIS — Z0001 Encounter for general adult medical examination with abnormal findings: Secondary | ICD-10-CM | POA: Diagnosis not present

## 2016-01-04 DIAGNOSIS — R0789 Other chest pain: Secondary | ICD-10-CM | POA: Diagnosis not present

## 2016-01-04 LAB — BASIC METABOLIC PANEL
BUN: 13 mg/dL (ref 6–23)
CHLORIDE: 104 meq/L (ref 96–112)
CO2: 26 meq/L (ref 19–32)
CREATININE: 0.64 mg/dL (ref 0.40–1.20)
Calcium: 9.3 mg/dL (ref 8.4–10.5)
GFR: 126.09 mL/min (ref >60.00)
Glucose, Bld: 96 mg/dL (ref 70–99)
Potassium: 4.4 mEq/L (ref 3.5–5.1)
Sodium: 135 mEq/L (ref 135–145)

## 2016-01-04 LAB — URINALYSIS, ROUTINE W REFLEX MICROSCOPIC
BILIRUBIN URINE: NEGATIVE
Ketones, ur: NEGATIVE
NITRITE: NEGATIVE
Specific Gravity, Urine: 1.025 (ref 1.000–1.030)
TOTAL PROTEIN, URINE-UPE24: NEGATIVE
URINE GLUCOSE: NEGATIVE
UROBILINOGEN UA: 0.2 (ref 0.0–1.0)
pH: 6 (ref 5.0–8.0)

## 2016-01-04 LAB — HEPATIC FUNCTION PANEL
ALBUMIN: 4 g/dL (ref 3.5–5.2)
ALT: 11 U/L (ref 0–35)
AST: 17 U/L (ref 0–37)
Alkaline Phosphatase: 64 U/L (ref 39–117)
Bilirubin, Direct: 0.1 mg/dL (ref 0.0–0.3)
TOTAL PROTEIN: 7.7 g/dL (ref 6.0–8.3)
Total Bilirubin: 0.6 mg/dL (ref 0.2–1.2)

## 2016-01-04 LAB — CBC WITH DIFFERENTIAL/PLATELET
Basophils Absolute: 0 10*3/uL (ref 0.0–0.1)
Basophils Relative: 0.6 % (ref 0.0–3.0)
EOS ABS: 0.1 10*3/uL (ref 0.0–0.7)
Eosinophils Relative: 1.1 % (ref 0.0–5.0)
HEMATOCRIT: 38.9 % (ref 36.0–46.0)
HEMOGLOBIN: 13.4 g/dL (ref 12.0–15.0)
LYMPHS PCT: 33 % (ref 12.0–46.0)
Lymphs Abs: 2.1 10*3/uL (ref 0.7–4.0)
MCHC: 34.3 g/dL (ref 30.0–36.0)
MCV: 90.5 fl (ref 78.0–100.0)
Monocytes Absolute: 0.5 10*3/uL (ref 0.1–1.0)
Monocytes Relative: 8.5 % (ref 3.0–12.0)
Neutro Abs: 3.6 10*3/uL (ref 1.4–7.7)
Neutrophils Relative %: 56.8 % (ref 43.0–77.0)
Platelets: 289 10*3/uL (ref 150.0–400.0)
RBC: 4.29 Mil/uL (ref 3.87–5.11)
RDW: 14.1 % (ref 11.5–15.5)
WBC: 6.3 10*3/uL (ref 4.0–10.5)

## 2016-01-04 LAB — LIPID PANEL
CHOL/HDL RATIO: 6
Cholesterol: 305 mg/dL — ABNORMAL HIGH (ref 0–200)
HDL: 51.4 mg/dL (ref >39.00)
LDL Cholesterol: 241 mg/dL — ABNORMAL HIGH (ref 0–99)
NonHDL: 253.99
TRIGLYCERIDES: 64 mg/dL (ref 0.0–149.0)
VLDL: 12.8 mg/dL (ref 0.0–40.0)

## 2016-01-04 LAB — TSH: TSH: 0.68 u[IU]/mL (ref 0.35–4.50)

## 2016-01-04 MED ORDER — ROSUVASTATIN CALCIUM 20 MG PO TABS: 20.0000 mg | ORAL_TABLET | Freq: Every day | ORAL | Status: DC

## 2016-01-04 NOTE — Assessment & Plan Note (Signed)
Uncontrolled, to cont low chols diet, start crestor 20 qd,  to f/u any worsening symptoms or concerns Lab Results  Component Value Date   LDLCALC 133* 01/03/2015

## 2016-01-04 NOTE — Assessment & Plan Note (Addendum)
Atypical, recent data is conficting , including most recently CT scan with noting CAD, with last cath with nor cors 2015, has ongoing elev lipids, will refer to Dr Debara Pickett who has seen in past - ? Need Cardiac CT in liue of repeat cath?  In addition to the time spent performing CPE, I spent an additional 40 minutes face to face,in which greater than 50% of this time was spent in counseling and coordination of care for patient's acute illness as documented.

## 2016-01-04 NOTE — Patient Instructions (Addendum)
Please take all new medication as prescribed - the crestor 20 mg  OK to stop the simvastatin  Please continue all other medications as before, and refills have been done if requested.  Please have the pharmacy call with any other refills you may need.  Please continue your efforts at being more active, low cholesterol diet, and weight control.  You are otherwise up to date with prevention measures today.  Please keep your appointments with your specialists as you may have planned  You will be contacted regarding the referral for: colonoscopy, and cardiology  Please go to the LAB in the Basement (turn left off the elevator) for the tests to be done today  You will be contacted by phone if any changes need to be made immediately.  Otherwise, you will receive a letter about your results with an explanation, but please check with MyChart first.  Please remember to sign up for MyChart if you have not done so, as this will be important to you in the future with finding out test results, communicating by private email, and scheduling acute appointments online when needed.  Please return in 1 year for your yearly visit, or sooner if needed, with Lab testing done 3-5 days before

## 2016-01-04 NOTE — Progress Notes (Signed)
Pre visit review using our clinic review tool, if applicable. No additional management support is needed unless otherwise documented below in the visit note. 

## 2016-01-04 NOTE — Assessment & Plan Note (Signed)
D/w pt, to focus on CRF modifications,  to f/u any worsening symptoms or concerns

## 2016-01-04 NOTE — Assessment & Plan Note (Signed)

## 2016-01-04 NOTE — Progress Notes (Signed)
Subjective:    Patient ID: [name], female    DOB: 05-16-1966, 50 y.o.   MRN: [id number]  HPI  Here for wellness and f/u;  Overall doing ok;  Pt denies worsening SOB, DOE, wheezing, orthopnea, PND, worsening LE edema, palpitations, dizziness or syncope, though cont's to have intermittent CP's.  Seen at ED recent - CT neg for PE but + for CAD by CT scan.  Had abnormal stress test 2015 but normal cors on cath July 2015.Marland Kitchen  Pt denies neurological change such as new headache, facial or extremity weakness.  Pt denies polydipsia, polyuria, or low sugar symptoms. Pt states overall good compliance with treatment and medications, good tolerability, and has been trying to follow appropriate diet.  Pt denies worsening depressive symptoms, suicidal ideation or panic. No fever, night sweats, wt loss, loss of appetite, or other constitutional symptoms.  Pt states good ability with ADL's, has low fall risk, home safety reviewed and adequate, no other significant changes in hearing or vision, and only occasionally active with exercise.  Has ongoing eleveted chol with difficulty tolerating statins but willing to try crestor 20 mg, even if every other day Past Medical History  Diagnosis Date  . HYPERLIPIDEMIA 03/27/2010  . ANXIETY 03/27/2010  . COMMON MIGRAINE 03/27/2010  . GERD 03/27/2010  . [place] DISEASE, CERVICAL 03/27/2010  . BACK PAIN, CHRONIC 03/27/2010  . FATIGUE 03/27/2010  . Unspecified vitamin D deficiency 10/23/2010  . Multiple sclerosis ([place]) 10/23/2010    unconfirmed, but suspected  . Osteoporosis 09/24/2012  . Glaucoma     bilateral  . Classic migraine with aura 05/30/2015   Past Surgical History  Procedure Laterality Date  . Abdominal hysterectomy    . S/p knee surgury    . S/p eye surgury    . Neg stress test  2007  . Left heart catheterization with coronary angiogram N/A 03/31/2014    Procedure: LEFT HEART CATHETERIZATION WITH CORONARY ANGIOGRAM;  Surgeon: Pixie Casino, MD;  Location: [facility]  CATH LAB;  Service: Cardiovascular;  Laterality: N/A;    reports that she has never smoked. She has never used smokeless tobacco. She reports that she does not drink alcohol or use illicit drugs. family history includes Arthritis in her other; Cancer in her mother and other; Dementia in her other; Diabetes in her father and other; Heart disease in her father and other; Hyperlipidemia in her father and mother; Hypertension in her father; Lupus in her cousin and maternal aunt. Allergies  Allergen Reactions  . Augmentin [Amoxicillin-Pot Clavulanate] Rash and Other (See Comments)    Pt stated "throat felt funny"  . Lipitor [Atorvastatin] Other (See Comments)    myalgia  . Lovastatin Other (See Comments)    myalgias  . Statins Nausea And Vomiting  . Topamax Other (See Comments)    dizziness   Current Outpatient Prescriptions on File Prior to Visit  Medication Sig Dispense Refill  . ALPRAZolam (XANAX) 0.5 MG tablet TAKE 1/2 TABLET BY MOUTH TWICE DAILY AS NEEDED FOR ANXIETY 60 tablet 2  . brimonidine-timolol (COMBIGAN) 0.2-0.5 % ophthalmic solution Place 1 drop into both eyes every 12 (twelve) hours.    . cholecalciferol (VITAMIN D) 1000 UNITS tablet Take 1,000 Units by mouth daily.    . cyclobenzaprine (FLEXERIL) 5 MG tablet Take 1 tablet (5 mg total) by mouth 3 (three) times daily as needed for muscle spasms. 60 tablet 2  . gabapentin (NEURONTIN) 100 MG capsule Take 1 capsule (100 mg total) by mouth at  bedtime. (Patient taking differently: Take 100 mg by mouth at bedtime as needed (nerve pain.). ) 30 capsule 3  . ibuprofen (ADVIL,MOTRIN) 200 MG tablet Take 400 mg by mouth every 6 (six) hours as needed for fever, headache or mild pain.    . naproxen (NAPROSYN) 500 MG tablet Take 1 tablet (500 mg total) by mouth 2 (two) times daily. 14 tablet 0  . NITROSTAT 0.4 MG SL tablet Place 1 tablet under the tongue every 15 (fifteen) minutes as needed for chest pain.     . ranitidine (ZANTAC) 75 MG tablet  Take 150 mg by mouth daily as needed for heartburn.     . simvastatin (ZOCOR) 40 MG tablet Take 1 tablet (40 mg total) by mouth daily. 90 tablet 3  . SUMAtriptan (IMITREX) 100 MG tablet Take 1 tablet (100 mg total) by mouth 2 (two) times daily as needed for migraine. 10 tablet 2   No current facility-administered medications on file prior to visit.   Review of Systems Constitutional: Negative for increased diaphoresis, or other activity, appetite or siginficant weight change other than noted HENT: Negative for worsening hearing loss, ear pain, facial swelling, mouth sores and neck stiffness.   Eyes: Negative for other worsening pain, redness or visual disturbance.  Respiratory: Negative for choking or stridor Cardiovascular: Negative for other chest pain and palpitations.  Gastrointestinal: Negative for worsening diarrhea, blood in stool, or abdominal distention Genitourinary: Negative for hematuria, flank pain or change in urine volume.  Musculoskeletal: Negative for myalgias or other joint complaints.  Skin: Negative for other color change and wound or drainage.  Neurological: Negative for syncope and numbness. other than noted Hematological: Negative for adenopathy. or other swelling Psychiatric/Behavioral: Negative for hallucinations, SI, self-injury, decreased concentration or other worsening agitation.      Objective:   Physical Exam BP 124/80 mmHg  Pulse 70  Temp(Src) 98.5 F (36.9 C) (Oral)  Resp 20  Wt 219 lb (99.338 kg)  SpO2 97% VS noted, not ill appearing Constitutional: Pt is oriented to person, place, and time. Appears well-developed and well-nourished, in no significant distress Head: Normocephalic and atraumatic  Eyes: Conjunctivae and EOM are normal. Pupils are equal, round, and reactive to light Right Ear: External ear normal.  Left Ear: External ear normal Nose: Nose normal.  Mouth/Throat: Oropharynx is clear and moist  Neck: Normal range of motion. Neck  supple. No JVD present. No tracheal deviation present or significant neck LA or mass Cardiovascular: Normal rate, regular rhythm, normal heart sounds and intact distal pulses.   Pulmonary/Chest: Effort normal and breath sounds without rales or wheezing  Abdominal: Soft. Bowel sounds are normal. NT. No HSM  Musculoskeletal: Normal range of motion. Exhibits no edema Lymphadenopathy: Has no cervical adenopathy.  Neurological: Pt is alert and oriented to person, place, and time. Pt has normal reflexes. No cranial nerve deficit. Motor grossly intact Skin: Skin is warm and dry. No rash noted or new ulcers Psychiatric:  Has normal mood and affect. Behavior is normal.   CLINICAL DATA: Evaluate for pulmonary embolus. Intermittent chest pain began yesterday and is worse today. Fatigue and shortness of breath.  EXAM: CT ANGIOGRAPHY CHEST WITH CONTRAST  TECHNIQUE: Multidetector CT imaging of the chest was performed using the standard protocol during bolus administration of intravenous contrast. Multiplanar CT image reconstructions and MIPs were obtained to evaluate the vascular anatomy.  CONTRAST: 117mL OMNIPAQUE IOHEXOL 350 MG/ML SOLN  COMPARISON: 10/28/2015  FINDINGS: Heart: Under artery calcifications are present. Heart size is  normal.  Vascular structures: The pulmonary arteries are well opacified. There is no evidence for acute pulmonary embolus. Thoracic aorta is normal in appearance.  Mediastinum/thyroid: The visualized portion of the thyroid gland has a normal appearance. No mediastinal, hilar, or axillary adenopathy.  Lungs/Airways: No pulmonary nodules, pleural effusions, or infiltrates.  Upper abdomen: Unremarkable.  Chest wall/osseous structures: Unremarkable.  Review of the MIP images confirms the above findings.  IMPRESSION: Technically adequate exam showing no acute pulmonary embolus.  Coronary artery disease.   Electronically Signed  By:  Nolon Nations M.D.  On: 10/28/2015 12:22    Assessment & Plan:

## 2016-01-07 DIAGNOSIS — I2510 Atherosclerotic heart disease of native coronary artery without angina pectoris: Secondary | ICD-10-CM | POA: Diagnosis not present

## 2016-01-07 DIAGNOSIS — K219 Gastro-esophageal reflux disease without esophagitis: Secondary | ICD-10-CM | POA: Diagnosis not present

## 2016-01-07 DIAGNOSIS — E785 Hyperlipidemia, unspecified: Secondary | ICD-10-CM | POA: Diagnosis not present

## 2016-01-07 DIAGNOSIS — M1990 Unspecified osteoarthritis, unspecified site: Secondary | ICD-10-CM | POA: Diagnosis not present

## 2016-01-14 DIAGNOSIS — H401112 Primary open-angle glaucoma, right eye, moderate stage: Secondary | ICD-10-CM | POA: Diagnosis not present

## 2016-01-22 ENCOUNTER — Encounter: Payer: Self-pay | Admitting: Internal Medicine

## 2016-01-22 ENCOUNTER — Ambulatory Visit (INDEPENDENT_AMBULATORY_CARE_PROVIDER_SITE_OTHER): Payer: BLUE CROSS/BLUE SHIELD | Admitting: Internal Medicine

## 2016-01-22 VITALS — BP 94/68 | HR 72 | Ht 63.0 in | Wt 214.0 lb

## 2016-01-22 DIAGNOSIS — R002 Palpitations: Secondary | ICD-10-CM | POA: Diagnosis not present

## 2016-01-22 DIAGNOSIS — I2510 Atherosclerotic heart disease of native coronary artery without angina pectoris: Secondary | ICD-10-CM

## 2016-01-22 DIAGNOSIS — R0683 Snoring: Secondary | ICD-10-CM

## 2016-01-22 DIAGNOSIS — G4719 Other hypersomnia: Secondary | ICD-10-CM | POA: Diagnosis not present

## 2016-01-22 DIAGNOSIS — E7801 Familial hypercholesterolemia: Secondary | ICD-10-CM

## 2016-01-22 MED ORDER — EZETIMIBE 10 MG PO TABS: 10.0000 mg | ORAL_TABLET | Freq: Every day | ORAL | Status: DC

## 2016-01-22 NOTE — Progress Notes (Signed)
OFFICE NOTE  Chief Complaint:  Palpitations, CAD  Primary Care Physician: Cathlean Cower, MD  HPI:  [name] is a pleasant 50 year old female who has been suffering with symptoms of chest pain and palpitations for quite some time. Apparently in 2008 she had an episode of chest pain and was admitted to the hospital. She was treated and ruled out and continued to have very intermittent episodes of discomfort over the years. Recently she had some worsening symptoms including chest discomfort and feeling of palpitations. This is followed by significant fatigue and lack of energy for several days after the palpitations have resolved. It is associated with heaviness in the chest that is behind the sternum. It does not radiate to the arm or to the jaw. Sometimes the symptoms are worse when she is exerting herself and improve at rest. Other times they occur at rest. She had recently seen Dr. Terrence Dupont, for a cardiac opinion. He ordered a stress test which was performed at [facility]. 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 No evidence for inducible ischemia. Normal function with an EF of 66%".  This was interpreted by a Development worker, community.  Apparently she received a call saying her stress test was negative and she was told to followup with him in the office in 3 weeks. When she came back she was allegedly told that her stress test showed a prior heart attack. There does however appear to be conflicting data. Based on this she is seeking another opinion, especially in light of recurrent symptoms. She does report that the palpitation episodes are actually fairly infrequent. She says she's had about 3 episodes of this year and perhaps 2 or 3 episodes last year.  [name] returns today for follow-up. After undergoing noninvasive testing which was  equivocal, there was still some concern for possible coronary ischemia. She underwent definitive cardiac catheterization by myself which demonstrated normal coronary arteries. Based on these findings I'm not suspicious that her chest pain is cardiac. Additionally, she has few risk factors for variant angina or small vessel disease. She does have migraine headaches which could put her at risk for coronary spasm. Treatments for possible coronary spasm include beta blockers, calcium channel blockers or nitrates. She seems to have had problems with low blood pressure in the past and I did not think will tolerate these medicines. She was started on low-dose propranolol for migraines and she said she only took 1 pill and felt horrible with it. Therefore, I do not feel she'll tolerate a beta blocker. Her discomfort in the chest is not lifestyle or activity limiting.  01/22/2016  [name] was seen today in the office. She was sent back for follow-up due to recent ER visit where she was shown to have artery artery calcification on her CT however her cardiac catheterization 2015 showed angiographically normal coronaries. 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 Recently she had reassessment of her cholesterol by her primary care provider. She had been intolerant of statin therapy, in fact had not tolerated both Lipitor and lovastatin in the past. Her total cholesterol was 305, triglycerides 61, HDL 51 and LDL 241. 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 There is a history of coronary disease in her family. She has early coronary  artery calcifications at age 28 which is considered premature coronary artery disease although she did not have significant obstruction she will need aggressive lipid therapy. Recently she was started on Crestor 20 mg by primary care provider but had side effects from this which  went away after being off of it for a week. She was then switched to simvastatin 20 mg which so far she is tolerating. I did discuss the lipid lowering properties of simvastatin the fact that she needs more aggressive lowering of LDL cholesterol with a goal LDL of at least 70 if not 50 based on new guidelines, which she will never reached on statin therapy alone. Currently she is on max tolerated statin therapy. I like to add said he had 10 mg to her regimen. We'll also refer her to our lipid clinic as she is a good candidate for the PCSK9 inhibitor Repatha. Her calculated DUTCH score is 6 which is suggestive of FH. She also reports some daytime fatigue, somnolence and difficulty sleeping at night. She's been noted to awaken suddenly with gasping concerning for possible sleep apnea.  PMHx:  Past Medical History  Diagnosis Date  . HYPERLIPIDEMIA 03/27/2010  . ANXIETY 03/27/2010  . COMMON MIGRAINE 03/27/2010  . GERD 03/27/2010  . [place] DISEASE, CERVICAL 03/27/2010  . BACK PAIN, CHRONIC 03/27/2010  . FATIGUE 03/27/2010  . Unspecified vitamin D deficiency 10/23/2010  . Multiple sclerosis ([place]) 10/23/2010    unconfirmed, but suspected  . Osteoporosis 09/24/2012  . Glaucoma     bilateral  . Classic migraine with aura 05/30/2015    Past Surgical History  Procedure Laterality Date  . Abdominal hysterectomy    . S/p knee surgury    . S/p eye surgury    . Neg stress test  2007  . Left heart catheterization with coronary angiogram N/A 03/31/2014    Procedure: LEFT HEART CATHETERIZATION WITH CORONARY ANGIOGRAM;  Surgeon: Pixie Casino, MD;  Location: [facility] CATH LAB;  Service: Cardiovascular;  Laterality: N/A;    FAMHx:  Family History  Problem Relation Age of Onset  . Heart disease Father   . Diabetes Father   . Hyperlipidemia Father   . Hypertension Father   . Diabetes Other   . Arthritis Other   . Dementia Other   . Cancer Other     2 uncles with lung cancer  . Heart disease Other     2 uncles  with heart disease  . Cancer Mother     breast  . Hyperlipidemia Mother   . Lupus Maternal Aunt   . Lupus Cousin     mother and father's side    SOCHx:   reports that she has never smoked. She has never used smokeless tobacco. She reports that she does not drink alcohol or use illicit drugs.  ALLERGIES:  Allergies  Allergen Reactions  . Augmentin [Amoxicillin-Pot Clavulanate] Rash and Other (See Comments)    Pt stated "throat felt funny"  . Crestor [Rosuvastatin Calcium] Other (See Comments)    Myalgias   . Lipitor [Atorvastatin] Other (See Comments)    myalgia  . Lovastatin Other (See Comments)    myalgias  . Statins Nausea And Vomiting  . Topamax Other (See Comments)    dizziness    ROS: Pertinent items noted in HPI and remainder of comprehensive ROS otherwise negative.  HOME MEDS: Current Outpatient Prescriptions  Medication Sig Dispense Refill  . ALPRAZolam (XANAX) 0.5 MG tablet TAKE 1/2 TABLET BY MOUTH TWICE DAILY AS NEEDED FOR  ANXIETY 60 tablet 2  . brimonidine-timolol (COMBIGAN) 0.2-0.5 % ophthalmic solution Place 1 drop into both eyes every 12 (twelve) hours.    . cholecalciferol (VITAMIN D) 1000 UNITS tablet Take 1,000 Units by mouth daily.    . cyclobenzaprine (FLEXERIL) 5 MG tablet Take 1 tablet (5 mg total) by mouth 3 (three) times daily as needed for muscle spasms. 60 tablet 2  . ibuprofen (ADVIL,MOTRIN) 200 MG tablet Take 400 mg by mouth every 6 (six) hours as needed for fever, headache or mild pain.    Marland Kitchen NITROSTAT 0.4 MG SL tablet Place 1 tablet under the tongue every 15 (fifteen) minutes as needed for chest pain.     . ranitidine (ZANTAC) 75 MG tablet Take 150 mg by mouth daily as needed for heartburn.     . simvastatin (ZOCOR) 20 MG tablet Take 20 mg by mouth daily.    Marland Kitchen ezetimibe (ZETIA) 10 MG tablet Take 1 tablet (10 mg total) by mouth daily. 90 tablet 3   No current facility-administered medications for this visit.    LABS/IMAGING: No results found  for this or any previous visit (from the past 48 hour(s)). No results found.  VITALS: BP 94/68 mmHg  Pulse 72  Ht 5\' 3"  (1.6 m)  Wt 214 lb (97.07 kg)  BMI 37.92 kg/m2  EXAM: General appearance: alert and no distress Neck: no carotid bruit, no JVD and thyroid not enlarged, symmetric, no tenderness/mass/nodules Lungs: clear to auscultation bilaterally Heart: regular rate and rhythm, S1, S2 normal, no murmur, click, rub or gallop Abdomen: soft, non-tender; bowel sounds normal; no masses,  no organomegaly Extremities: extremities normal, atraumatic, no cyanosis or edema Pulses: 2+ and symmetric Skin: Skin color, texture, turgor normal. No rashes or lesions Neurologic: Grossly normal Psych: Mood, affect normal  EKG: Normal sinus rhythm at 72  ASSESSMENT: 1. Persistent chest pain and palpitations - normal coronary arteries by catheterization (2015) 2. Coronary artery calcification on CT scan 3. Possible history of multiple sclerosis 4. Familial hypercholesterolemia  PLAN: 1.   [name] is at high risk for coronary events and has early coronary artery calcification on CT although had no coronary artery obstructions by catheterization in 2015. Based on these findings she will need aggressive risk factor modification. Her LDL cholesterol is very high in unfortunate she's been intolerant to number statins. She is currently on max tolerated dose of simvastatin although this medication cannot be uptitrated due to FDA regulations. I have added Zettie at 10 mg daily to her regimen today and will refer her to our lipid clinic to start the paperwork for evaluation of a PCSK9 inhibitor Repatha, which could be significantly helpful in lowering her cholesterol and overall coronary risk. I will also obtain a sleep study today is as high likelihood of obstructive sleep apnea which could be the cause of her episodic palpitations.  Follow-up with me in 2 months.  Pixie Casino, MD, [facility] Attending  Cardiologist [place] Toben Acuna 01/22/2016, 9:36 AM

## 2016-01-22 NOTE — Patient Instructions (Addendum)
Your physician has recommended that you have a sleep study @ [facility]. This test records several body functions during sleep, including: brain activity, eye movement, oxygen and carbon dioxide blood levels, heart rate and rhythm, breathing rate and rhythm, the flow of air through your mouth and nose, snoring, body muscle movements, and chest and belly movement.  Your physician has recommended you make the following change in your medication: START zetia 10mg  once daily in addition to simvastatin 20mg  once daily  Your physician recommends that you schedule a follow-up appointment in 2 months after your sleep study.  You have been referred to CLINICAL PHARMACIST for a lipid clinic appointment/evaluate for Repatha

## 2016-02-01 ENCOUNTER — Encounter: Payer: Self-pay | Admitting: Pharmacist Clinician (PhC)/ Clinical Pharmacy Specialist

## 2016-02-01 ENCOUNTER — Ambulatory Visit (INDEPENDENT_AMBULATORY_CARE_PROVIDER_SITE_OTHER): Payer: BLUE CROSS/BLUE SHIELD | Admitting: Pharmacist Clinician (PhC)/ Clinical Pharmacy Specialist

## 2016-02-01 VITALS — Ht 63.0 in | Wt 210.8 lb

## 2016-02-01 DIAGNOSIS — E785 Hyperlipidemia, unspecified: Secondary | ICD-10-CM | POA: Diagnosis not present

## 2016-02-01 DIAGNOSIS — I2510 Atherosclerotic heart disease of native coronary artery without angina pectoris: Secondary | ICD-10-CM

## 2016-02-01 MED ORDER — SIMVASTATIN 20 MG PO TABS: 20.0000 mg | ORAL_TABLET | Freq: Every day | ORAL | Status: DC

## 2016-02-01 NOTE — Patient Instructions (Addendum)
Take simvastatin 20 mg every other day alternating with 40 mg.  If you are able to tolerate this after 2-3 weeks, increase to 40 mg daily.  Call in 3-4 weeks and let us know what dose you are taking.    Will repeat labs in 10 weeks (first week of August) and see you about 1 week after that.  Cholesterol Cholesterol is a fat. Your body needs a small amount of cholesterol. Cholesterol may build up in your blood vessels. This increases your chance of having a heart attack or stroke. You cannot feel your cholesterol levels. The only way to know your cholesterol level is high is with a blood test. Keep your test results. Work with your doctor to keep your cholesterol at a good level. WHAT DO THE TEST RESULTS MEAN?  Total cholesterol is how much cholesterol is in your blood.  LDL is bad cholesterol. This is the type that can build up. You want LDL to be low.  HDL is good cholesterol. It cleans your blood vessels and carries LDL away. You want HDL to be high.  Triglycerides are fat that the body can burn for energy or store. WHAT ARE GOOD LEVELS OF CHOLESTEROL?  Total cholesterol below 200.  LDL below 100 for people at risk. Below 70 for those at very high risk.  HDL above 50 is good. Above 60 is best.  Triglycerides below 150. HOW CAN I LOWER MY CHOLESTEROL?  Diet. Follow your diet programs as told by your doctor.  Choose fish, white meat chicken, roasted [place], or baked [place]. Try not to eat red meat, fried foods, or processed meats such as sausage and lunch meats.  Eat lots of fresh fruits and vegetables.  Choose whole grains, beans, pasta, potatoes, and cereals.  Use only small amounts of olive, corn, or canola oils.  Try not to eat butter, mayonnaise, shortening, or palm kernel oils.  Try not to eat foods with trans fats.  Drink skim or nonfat milk. Eat low-fat or nonfat yogurt and cheeses. Try not to drink whole milk or cream. Try not to eat ice cream, egg yolks, and  full-fat cheeses.  Healthy desserts include angel food cake, ginger snaps, animal crackers, hard candy, popsicles, and low-fat or nonfat frozen yogurt. Try not to eat pastries, cakes, pies, and cookies.  Exercise. Follow your exercise programs as told by your doctor.  Be more active. You can try gardening, walking, or taking the stairs. Ask your doctor about how you can be more active.  Medicine. Take medicine as told by your doctor.   This information is not intended to replace advice given to you by your health care provider. Make sure you discuss any questions you have with your health care provider.   Document Released: 11/21/2008 Document Revised: 09/15/2014 Document Reviewed: 06/08/2013 Elsevier Interactive Patient Education [employer].

## 2016-02-01 NOTE — Progress Notes (Signed)
02/01/2016 KALIOPE STAIR 08/19/1966 [id number]   HPI:  [name] is a 50 y.o. female patient of Dr Debara Pickett, who presents today for a lipid clinic evaluation.  A stress test done several years ago shows indication of a remote infarct.  And a recent CT scan showed evidence of coronary artery calcification.  She continues to have persistent chest pain with occasional bouts of palpitations.  Today in the office she complains of some dizziness and ear pressure, admits this happens from time to time and is currently being evaluated by neurology.  She started back on simvastatin 20 mg about a month ago, at the same time that she had her cholesterol labs drawn.    Her DUTCH lipid score is a 5 (3 for LDL 241, 2 for patient premature cardiovascular disease)  RF: noted coronary artery calcium; remote cardiac infarct    Meds: simvastatin 20 mg daily    Intolerant/previously tried: lovastatin 40 mg (May 2013), lovastatin 10 mg (May-Oct 2013), rosuvastatin 20 (April 2015) - all caused myalgias; ezetimibe (May 2017) caused GI  cramping  Family history: strong history of DM on father's side; father with MI/CABG at 65 still living now 80; mother with elevated cholesterol, living at 59  Social history: no tobacco, alcohol or caffeine  Diet: tries to follow healthy diet - eats only chicken, [place] or fish; plenty of salad, fruits; does not like pasta or breads; no soda  Exercise: trying to increase walking to 30-40 minutes daily    Labs:  12/2015:  TC 305; TG 64; HDL 51.4; LDL 241 (no medication) 12/2014:  TC 199; TG 76; HDL 50.4; LDL 133 (simvastatin 20)    Current Outpatient Prescriptions  Medication Sig Dispense Refill  . ALPRAZolam (XANAX) 0.5 MG tablet TAKE 1/2 TABLET BY MOUTH TWICE DAILY AS NEEDED FOR ANXIETY 60 tablet 2  . brimonidine-timolol (COMBIGAN) 0.2-0.5 % ophthalmic solution Place 1 drop into both eyes every 12 (twelve) hours.    . cholecalciferol (VITAMIN D) 1000 UNITS tablet Take 1,000  Units by mouth daily.    . cyclobenzaprine (FLEXERIL) 5 MG tablet Take 1 tablet (5 mg total) by mouth 3 (three) times daily as needed for muscle spasms. 60 tablet 2  . ibuprofen (ADVIL,MOTRIN) 200 MG tablet Take 400 mg by mouth every 6 (six) hours as needed for fever, headache or mild pain.    Marland Kitchen NITROSTAT 0.4 MG SL tablet Place 1 tablet under the tongue every 15 (fifteen) minutes as needed for chest pain.     . ranitidine (ZANTAC) 75 MG tablet Take 150 mg by mouth daily as needed for heartburn.     . simvastatin (ZOCOR) 20 MG tablet Take 1 tablet (20 mg total) by mouth at bedtime. 90 tablet 3   No current facility-administered medications for this visit.    Allergies  Allergen Reactions  . Augmentin [Amoxicillin-Pot Clavulanate] Rash and Other (See Comments)    Pt stated "throat felt funny"  . Crestor [Rosuvastatin Calcium] Other (See Comments)    Myalgias   . Lipitor [Atorvastatin] Other (See Comments)    myalgia  . Lovastatin Other (See Comments)    myalgias  . Statins Nausea And Vomiting  . Topamax Other (See Comments)    dizziness    Past Medical History  Diagnosis Date  . HYPERLIPIDEMIA 03/27/2010  . ANXIETY 03/27/2010  . COMMON MIGRAINE 03/27/2010  . GERD 03/27/2010  . [place] DISEASE, CERVICAL 03/27/2010  . BACK PAIN, CHRONIC 03/27/2010  . FATIGUE 03/27/2010  .  Unspecified vitamin D deficiency 10/23/2010  . Multiple sclerosis ([place]) 10/23/2010    unconfirmed, but suspected  . Osteoporosis 09/24/2012  . Glaucoma     bilateral  . Classic migraine with aura 05/30/2015    Height 5\' 3"  (1.6 m), weight 210 lb 12.8 oz (95.618 kg).   ASSESSMENT AND PLAN:  Tommy Medal PharmD CPP [facility]

## 2016-02-02 NOTE — Assessment & Plan Note (Signed)
Patient with elevated LDL and DUTCH score of 5.  She just recently re-started her simvastatin after developing myalgias on rosuvastatin.  She started back at 20 mg daily, but believes she previously took 40 mg.  Will have her double her dose to 40 mg every other day for 1-2 weeks, and if she tolerates it will increase to 40 mg daily.  We discussed the options of PCSK-9 inhibitors, which she understands are probably her best option.  She is on Medicare/disability, so before we can apply, will need to show drop in LDL with simvastatin for 2-3 months.  She will repeat labs at the first of August and return for a follow up visit at that time.

## 2016-02-11 ENCOUNTER — Encounter: Payer: Self-pay | Admitting: Gastroenterology

## 2016-03-14 ENCOUNTER — Ambulatory Visit (HOSPITAL_BASED_OUTPATIENT_CLINIC_OR_DEPARTMENT_OTHER): Payer: BLUE CROSS/BLUE SHIELD | Attending: Internal Medicine | Admitting: Cardiovascular Disease

## 2016-03-14 DIAGNOSIS — G4719 Other hypersomnia: Secondary | ICD-10-CM

## 2016-03-14 DIAGNOSIS — R51 Headache: Secondary | ICD-10-CM | POA: Diagnosis not present

## 2016-03-14 DIAGNOSIS — E669 Obesity, unspecified: Secondary | ICD-10-CM | POA: Insufficient documentation

## 2016-03-14 DIAGNOSIS — Z79899 Other long term (current) drug therapy: Secondary | ICD-10-CM | POA: Diagnosis not present

## 2016-03-14 DIAGNOSIS — R5383 Other fatigue: Secondary | ICD-10-CM | POA: Insufficient documentation

## 2016-03-14 DIAGNOSIS — Z6831 Body mass index (BMI) 31.0-31.9, adult: Secondary | ICD-10-CM | POA: Diagnosis not present

## 2016-03-14 DIAGNOSIS — R0683 Snoring: Secondary | ICD-10-CM | POA: Insufficient documentation

## 2016-03-20 ENCOUNTER — Encounter (HOSPITAL_BASED_OUTPATIENT_CLINIC_OR_DEPARTMENT_OTHER): Payer: Self-pay | Admitting: Cardiovascular Disease

## 2016-03-20 NOTE — Procedures (Signed)
Patient Name: [name], [name] Date: 03/14/2016 Gender: Female D.O.B: 05-Mar-1966 Age (years): 61 Referring Provider: Nadean Corwin Hilty Height (inches): 64 Interpreting Physician: Shelva Majestic MD, ABSM Weight (lbs): 175 RPSGT: Laren Everts BMI: 31 MRN: [id number] Neck Size: 13.00  CLINICAL INFORMATION Sleep Study Type: NPSG Indication for sleep study: Excessive Daytime Sleepiness, Fatigue, Morning Headaches, Obesity, Snoring Epworth Sleepiness Score: 9  SLEEP STUDY TECHNIQUE As per the AASM Manual for the Scoring of Sleep and Associated Events v2.3 (April 2016) with a hypopnea requiring 4% desaturations. The channels recorded and monitored were frontal, central and occipital EEG, electrooculogram (EOG), submentalis EMG (chin), nasal and oral airflow, thoracic and abdominal wall motion, anterior tibialis EMG, snore microphone, electrocardiogram, and pulse oximetry.  MEDICATIONS  simvastatin (ZOCOR) 20 MG tablet 20 mg, Daily at bedtime     ranitidine (ZANTAC) 75 MG tablet 150 mg, Daily PRN     NITROSTAT 0.4 MG SL tablet 1 tablet, Every 15 min PRN     ibuprofen (ADVIL,MOTRIN) 200 MG tablet 400 mg, Every 6 hours PRN     cyclobenzaprine (FLEXERIL) 5 MG tablet 5 mg, 3 times daily PRN     cholecalciferol (VITAMIN D) 1000 UNITS tablet 1,000 Units, Daily     brimonidine-timolol (COMBIGAN) 0.2-0.5 % ophthalmic solution 1 drop, Every 12 hours     ALPRAZolam (XANAX) 0.5 MG tablet   Medications self-administered by patient during sleep study : No sleep medicine administered.  SLEEP ARCHITECTURE The study was initiated at 9:37:51 PM and ended at 4:20:18 AM. Sleep onset time was 5.2 minutes and the sleep efficiency was 55.4%. The total sleep time was 223.0 minutes. Wake after sleep onset (WASO) was 174.2 minutes. Stage REM latency was 116.0 minutes. The patient spent 17.94% of the night in stage N1 sleep, 60.76% in stage N2 sleep, 0.00% in stage N3 and 21.30% in REM. Alpha  intrusion was absent. Supine sleep was 3.59%.  RESPIRATORY PARAMETERS The overall apnea/hypopnea index (AHI) was 0.8 per hour. There were 2 total apneas, including 2 obstructive, 0 central and 0 mixed apneas. There were 1 hypopneas and 3 RERAs. The AHI during Stage REM sleep was 2.5 per hour. AHI while supine was 0.0 per hour. The mean oxygen saturation was 96.11%. The minimum SpO2 during sleep was 83.00%. Time < 88% was 2.4 minutes. Soft snoring was noted during this study.  CARDIAC DATA The 2 lead EKG demonstrated sinus rhythm. The mean heart rate was 51.60 beats per minute. Other EKG findings include: None.  LEG MOVEMENT DATA The total PLMS were 0 with a resulting PLMS index of 0.00. Associated arousal with leg movement index was 0.0 .  IMPRESSIONS - Mild increased upper airway resistance without significant obstructive sleep apnea (AHI = 0.8/h).  - No significant central sleep apnea occurred during this study (CAI = 0.0/h). - Reduced sleep efficiency at 55.4% - Transient oxygen desaturation to a nadir of  83.00% with non-REM sleep. - The patient snored with Soft snoring volume. - No cardiac abnormalities were noted during this study. - Clinically significant periodic limb movements did not occur during sleep. No significant associated arousals.  DIAGNOSIS - Mild increased upper airway resistance.  RECOMMENDATIONS - At present there is no indication for CPAP therapy. - Efforts should be made to optimize nasal and oropharyngeal patency. - Consider a follow-up nocturnal oximetry study to re-assess oxygen desaturation. - Avoid alcohol, sedatives and other CNS depressants that may worsen sleep apnea and disrupt normal sleep architecture. - Sleep hygiene should  be reviewed to assess factors that may improve sleep quality. - Weight management and regular exercise should be initiated or continued if appropriate.   Shelva Majestic MD, [place], [place] Board of Sleep  Medicine NPI: [id number]   ELECTRONICALLY SIGNED ON:  03/20/2016, 9:11 PM [place] PH: [phone]   FX: [phone] [place]

## 2016-03-21 ENCOUNTER — Other Ambulatory Visit: Payer: BLUE CROSS/BLUE SHIELD

## 2016-03-21 ENCOUNTER — Other Ambulatory Visit: Payer: Self-pay | Admitting: Internal Medicine

## 2016-03-21 LAB — HEPATIC FUNCTION PANEL
ALK PHOS: 75 U/L (ref 33–130)
ALT: 29 U/L (ref 6–29)
AST: 29 U/L (ref 10–35)
Albumin: 3.7 g/dL (ref 3.6–5.1)
BILIRUBIN DIRECT: 0.1 mg/dL (ref <=0.2)
BILIRUBIN INDIRECT: 0.4 mg/dL (ref 0.2–1.2)
Total Bilirubin: 0.5 mg/dL (ref 0.2–1.2)
Total Protein: 7.1 g/dL (ref 6.1–8.1)

## 2016-03-21 LAB — BASIC METABOLIC PANEL
BUN: 6 mg/dL — ABNORMAL LOW (ref 7–25)
CALCIUM: 9.1 mg/dL (ref 8.6–10.4)
CHLORIDE: 103 mmol/L (ref 98–110)
CO2: 27 mmol/L (ref 20–31)
CREATININE: 0.55 mg/dL (ref 0.50–1.05)
Glucose, Bld: 89 mg/dL (ref 65–99)
POTASSIUM: 4.3 mmol/L (ref 3.5–5.3)
Sodium: 140 mmol/L (ref 135–146)

## 2016-03-21 LAB — LIPID PANEL
CHOL/HDL RATIO: 3.3 ratio (ref <=5.0)
CHOLESTEROL: 165 mg/dL (ref 125–200)
HDL: 50 mg/dL (ref >=46)
LDL Cholesterol: 98 mg/dL (ref <130)
Triglycerides: 83 mg/dL (ref <150)
VLDL: 17 mg/dL (ref <30)

## 2016-03-24 ENCOUNTER — Encounter: Payer: Self-pay | Admitting: Internal Medicine

## 2016-03-24 ENCOUNTER — Ambulatory Visit (INDEPENDENT_AMBULATORY_CARE_PROVIDER_SITE_OTHER): Payer: BLUE CROSS/BLUE SHIELD | Admitting: Internal Medicine

## 2016-03-24 VITALS — BP 134/76 | HR 61 | Ht 63.5 in | Wt 205.6 lb

## 2016-03-24 DIAGNOSIS — E785 Hyperlipidemia, unspecified: Secondary | ICD-10-CM | POA: Diagnosis not present

## 2016-03-24 DIAGNOSIS — E7801 Familial hypercholesterolemia: Secondary | ICD-10-CM

## 2016-03-24 DIAGNOSIS — R002 Palpitations: Secondary | ICD-10-CM

## 2016-03-24 DIAGNOSIS — I2510 Atherosclerotic heart disease of native coronary artery without angina pectoris: Secondary | ICD-10-CM

## 2016-03-24 NOTE — Patient Instructions (Signed)
Your physician wants you to follow-up in: 6 months with Dr. Debara Pickett. You will receive a reminder letter in the mail two months in advance. If you don't receive a letter, please call our office to schedule the follow-up appointment.  Your physician recommends that you continue on your current medications as directed. Please refer to the Current Medication list given to you today.

## 2016-03-24 NOTE — Progress Notes (Signed)
OFFICE NOTE  Chief Complaint:  Palpitations, CAD  Primary Care Physician: Cathlean Cower, MD  HPI:  [name] is a pleasant 50 year old female who has been suffering with symptoms of chest pain and palpitations for quite some time. Apparently in 2008 she had an episode of chest pain and was admitted to the hospital. She was treated and ruled out and continued to have very intermittent episodes of discomfort over the years. Recently she had some worsening symptoms including chest discomfort and feeling of palpitations. This is followed by significant fatigue and lack of energy for several days after the palpitations have resolved. It is associated with heaviness in the chest that is behind the sternum. It does not radiate to the arm or to the jaw. Sometimes the symptoms are worse when she is exerting herself and improve at rest. Other times they occur at rest. She had recently seen Dr. Terrence Dupont, for a cardiac opinion. He ordered a stress test which was performed at [facility]. 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 No evidence for inducible ischemia. Normal function with an EF of 66%".  This was interpreted by a Development worker, community.  Apparently she received a call saying her stress test was negative and she was told to followup with him in the office in 3 weeks. When she came back she was allegedly told that her stress test showed a prior heart attack. There does however appear to be conflicting data. Based on this she is seeking another opinion, especially in light of recurrent symptoms. She does report that the palpitation episodes are actually fairly infrequent. She says she's had about 3 episodes of this year and perhaps 2 or 3 episodes last year.  [name] returns today for follow-up. After undergoing noninvasive testing which was  equivocal, there was still some concern for possible coronary ischemia. She underwent definitive cardiac catheterization by myself which demonstrated normal coronary arteries. Based on these findings I'm not suspicious that her chest pain is cardiac. Additionally, she has few risk factors for variant angina or small vessel disease. She does have migraine headaches which could put her at risk for coronary spasm. Treatments for possible coronary spasm include beta blockers, calcium channel blockers or nitrates. She seems to have had problems with low blood pressure in the past and I did not think will tolerate these medicines. She was started on low-dose propranolol for migraines and she said she only took 1 pill and felt horrible with it. Therefore, I do not feel she'll tolerate a beta blocker. Her discomfort in the chest is not lifestyle or activity limiting.  01/22/2016  [name] was seen today in the office. She was sent back for follow-up due to recent ER visit where she was shown to have artery artery calcification on her CT however her cardiac catheterization 2015 showed angiographically normal coronaries. 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 Recently she had reassessment of her cholesterol by her primary care provider. She had been intolerant of statin therapy, in fact had not tolerated both Lipitor and lovastatin in the past. Her total cholesterol was 305, triglycerides 61, HDL 51 and LDL 241. 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 There is a history of coronary disease in her family. She has early coronary  artery calcifications at age 1 which is considered premature coronary artery disease although she did not have significant obstruction she will need aggressive lipid therapy. Recently she was started on Crestor 20 mg by primary care provider but had side effects from this which  went away after being off of it for a week. She was then switched to simvastatin 20 mg which so far she is tolerating. I did discuss the lipid lowering properties of simvastatin the fact that she needs more aggressive lowering of LDL cholesterol with a goal LDL of at least 70 if not 50 based on new guidelines, which she will never reached on statin therapy alone. Currently she is on max tolerated statin therapy. I like to add said he had 10 mg to her regimen. We'll also refer her to our lipid clinic as she is a good candidate for the PCSK9 inhibitor Repatha. Her calculated DUTCH score is 6 which is suggestive of FH. She also reports some daytime fatigue, somnolence and difficulty sleeping at night. She's been noted to awaken suddenly with gasping concerning for possible sleep apnea.  03/24/2016  [name] is seen back today in the office. She's had a marked improved prove minute and her cholesterol on Zocor and that he had. Amazingly she's had more than a 50% reduction was suggested there may been a dietary component as well. Although it looks like she may have FH, her cholesterol is fairly well-controlled on her current regimen and therefore do not believe we need to pursue Repatha at this time. Recently she had her sleep study interpreted which indicated no evidence of obstructive sleep apnea. This is reassuring. She continues to exercise but is not seen a major weight reduction. Of encouraged her to keep that up because she is making some progress with regards to her health without question.  PMHx:  Past Medical History  Diagnosis Date  . HYPERLIPIDEMIA 03/27/2010  . ANXIETY 03/27/2010  . COMMON MIGRAINE 03/27/2010  . GERD 03/27/2010  . [place] DISEASE, CERVICAL 03/27/2010  . BACK PAIN, CHRONIC 03/27/2010  . FATIGUE 03/27/2010  . Unspecified vitamin D deficiency 10/23/2010  . Multiple sclerosis ([place]) 10/23/2010    unconfirmed, but suspected  . Osteoporosis 09/24/2012  . Glaucoma     bilateral  . Classic  migraine with aura 05/30/2015    Past Surgical History  Procedure Laterality Date  . Abdominal hysterectomy    . S/p knee surgury    . S/p eye surgury    . Neg stress test  2007  . Left heart catheterization with coronary angiogram N/A 03/31/2014    Procedure: LEFT HEART CATHETERIZATION WITH CORONARY ANGIOGRAM;  Surgeon: Pixie Casino, MD;  Location: [facility] CATH LAB;  Service: Cardiovascular;  Laterality: N/A;    FAMHx:  Family History  Problem Relation Age of Onset  . Heart disease Father   . Diabetes Father   . Hyperlipidemia Father   . Hypertension Father   . Diabetes Other   . Arthritis Other   . Dementia Other   . Cancer Other     2 uncles with lung cancer  . Heart disease Other     2 uncles with heart disease  . Cancer Mother     breast  . Hyperlipidemia Mother   . Lupus Maternal Aunt   . Lupus Cousin     mother and father's side    SOCHx:   reports that she has never smoked. She has never used smokeless tobacco. She reports that she does  not drink alcohol or use illicit drugs.  ALLERGIES:  Allergies  Allergen Reactions  . Augmentin [Amoxicillin-Pot Clavulanate] Rash and Other (See Comments)    Pt stated "throat felt funny"  . Crestor [Rosuvastatin Calcium] Other (See Comments)    Myalgias   . Lipitor [Atorvastatin] Other (See Comments)    myalgia  . Lovastatin Other (See Comments)    myalgias  . Statins Nausea And Vomiting  . Topamax Other (See Comments)    dizziness    ROS: Pertinent items noted in HPI and remainder of comprehensive ROS otherwise negative.  HOME MEDS: Current Outpatient Prescriptions  Medication Sig Dispense Refill  . ALPRAZolam (XANAX) 0.5 MG tablet TAKE 1/2 TABLET BY MOUTH TWICE DAILY AS NEEDED FOR ANXIETY 60 tablet 2  . brimonidine-timolol (COMBIGAN) 0.2-0.5 % ophthalmic solution Place 1 drop into both eyes every 12 (twelve) hours.    . cholecalciferol (VITAMIN D) 1000 UNITS tablet Take 1,000 Units by mouth daily.    .  cyclobenzaprine (FLEXERIL) 5 MG tablet Take 1 tablet (5 mg total) by mouth 3 (three) times daily as needed for muscle spasms. 60 tablet 2  . ibuprofen (ADVIL,MOTRIN) 200 MG tablet Take 400 mg by mouth every 6 (six) hours as needed for fever, headache or mild pain.    . ranitidine (ZANTAC) 75 MG tablet Take 150 mg by mouth daily as needed for heartburn.     . simvastatin (ZOCOR) 20 MG tablet Take 1 tablet (20 mg total) by mouth at bedtime. 90 tablet 3   No current facility-administered medications for this visit.    LABS/IMAGING: No results found for this or any previous visit (from the past 48 hour(s)). No results found.  VITALS: BP 134/76 mmHg  Pulse 61  Ht 5' 3.5" (1.613 m)  Wt 205 lb 9.6 oz (93.26 kg)  BMI 35.84 kg/m2  EXAM: Deferred  EKG: Deferred  ASSESSMENT: 1. Persistent chest pain and palpitations - normal coronary arteries by catheterization (2015) 2. Coronary artery calcification on CT scan 3. Possible history of multiple sclerosis 4. Possible Familial hypercholesterolemia - improved on Zocor and Zetia  PLAN: 1.   [name] has had significant improvement on Zocor and Zetia with dietary changes and increased exercise. Her LDL is now below 100 and her lipid profile looks more favorable. Of encouraged her to continue to work on exercise and diet as well as continue these medications. Other lab work looks stable and liver function is normal. Fortunately she had no evidence of obstructive sleep apnea on her sleep study. She reports her palpitations only last for a few seconds and therefore are not amenable to when necessary beta blocker.  Follow-up in 6 months.  Pixie Casino, MD, [facility] Attending Cardiologist [place] Shahram Alexopoulos 03/24/2016, 9:06 AM

## 2016-03-27 ENCOUNTER — Telehealth: Payer: Self-pay | Admitting: *Deleted

## 2016-03-27 NOTE — Telephone Encounter (Signed)
Left detailed sleep study results on patient's home answering machine okay per signed Dpr.

## 2016-03-27 NOTE — Progress Notes (Signed)
Left message on home machine.

## 2016-04-09 DIAGNOSIS — E669 Obesity, unspecified: Secondary | ICD-10-CM | POA: Diagnosis not present

## 2016-04-09 DIAGNOSIS — I2510 Atherosclerotic heart disease of native coronary artery without angina pectoris: Secondary | ICD-10-CM | POA: Diagnosis not present

## 2016-04-09 DIAGNOSIS — I252 Old myocardial infarction: Secondary | ICD-10-CM | POA: Diagnosis not present

## 2016-04-09 DIAGNOSIS — E785 Hyperlipidemia, unspecified: Secondary | ICD-10-CM | POA: Diagnosis not present

## 2016-04-21 DIAGNOSIS — H401131 Primary open-angle glaucoma, bilateral, mild stage: Secondary | ICD-10-CM | POA: Diagnosis not present

## 2016-04-21 DIAGNOSIS — H4423 Degenerative myopia, bilateral: Secondary | ICD-10-CM | POA: Diagnosis not present

## 2016-04-24 ENCOUNTER — Ambulatory Visit (INDEPENDENT_AMBULATORY_CARE_PROVIDER_SITE_OTHER)
Admission: RE | Admit: 2016-04-24 | Discharge: 2016-04-24 | Disposition: A | Discharge status: Home / Self Care | Payer: BLUE CROSS/BLUE SHIELD | Source: Ambulatory Visit | Attending: Nurse Practitioner | Admitting: Nurse Practitioner

## 2016-04-24 ENCOUNTER — Encounter: Payer: Self-pay | Admitting: Nurse Practitioner

## 2016-04-24 ENCOUNTER — Ambulatory Visit (INDEPENDENT_AMBULATORY_CARE_PROVIDER_SITE_OTHER): Payer: BLUE CROSS/BLUE SHIELD | Admitting: Nurse Practitioner

## 2016-04-24 VITALS — BP 102/68 | HR 82 | Temp 97.7°F

## 2016-04-24 DIAGNOSIS — M5410 Radiculopathy, site unspecified: Secondary | ICD-10-CM | POA: Diagnosis not present

## 2016-04-24 DIAGNOSIS — M25511 Pain in right shoulder: Secondary | ICD-10-CM | POA: Diagnosis not present

## 2016-04-24 DIAGNOSIS — I2510 Atherosclerotic heart disease of native coronary artery without angina pectoris: Secondary | ICD-10-CM

## 2016-04-24 NOTE — Progress Notes (Signed)
Subjective:  Patient ID: [name], female    DOB: 01-27-1966  Age: 50 y.o. MRN: [id number]  CC: Shoulder Pain (x 3weeks)   Shoulder Pain   The pain is present in the right shoulder. This is a new problem. The current episode started 1 to 4 weeks ago. There has been no history of extremity trauma. The problem occurs constantly. The problem has been waxing and waning. The quality of the pain is described as aching and dull. The pain is at a severity of 8/10. The pain is severe. Associated symptoms include numbness. Pertinent negatives include no fever, inability to bear weight, itching, joint locking, joint swelling, limited range of motion, stiffness or tingling. Associated symptoms comments: Numbness id worse in morning, but improves as day progresses. The symptoms are aggravated by activity and lying down. She has tried acetaminophen, NSAIDS, OTC ointments and OTC pain meds for the symptoms. The treatment provided mild relief. Family history does not include gout or rheumatoid arthritis. Her past medical history is significant for osteoarthritis. There is no history of diabetes, gout or rheumatoid arthritis.   [name] presents for right shoulder pain with radiculopathy.  Outpatient Medications Prior to Visit  Medication Sig Dispense Refill  . ALPRAZolam (XANAX) 0.5 MG tablet TAKE 1/2 TABLET BY MOUTH TWICE DAILY AS NEEDED FOR ANXIETY 60 tablet 2  . brimonidine-timolol (COMBIGAN) 0.2-0.5 % ophthalmic solution Place 1 drop into both eyes every 12 (twelve) hours.    . cholecalciferol (VITAMIN D) 1000 UNITS tablet Take 1,000 Units by mouth daily.    . cyclobenzaprine (FLEXERIL) 5 MG tablet Take 1 tablet (5 mg total) by mouth 3 (three) times daily as needed for muscle spasms. 60 tablet 2  . ibuprofen (ADVIL,MOTRIN) 200 MG tablet Take 400 mg by mouth every 6 (six) hours as needed for fever, headache or mild pain.    . ranitidine (ZANTAC) 75 MG tablet Take 150 mg by mouth daily as needed  for heartburn.     . simvastatin (ZOCOR) 20 MG tablet Take 1 tablet (20 mg total) by mouth at bedtime. 90 tablet 3   No facility-administered medications prior to visit.     ROS Review of Systems  Constitutional: Negative for fever.  Musculoskeletal: Negative for gout, joint swelling, myalgias, neck pain, neck stiffness and stiffness.  Skin: Negative.  Negative for itching.  Neurological: Positive for numbness. Negative for tingling.  Hematological: Negative for adenopathy.    Objective:  BP 102/68   Pulse 82   Temp 97.7 F (36.5 C)   SpO2 98%   BP Readings from Last 3 Encounters:  04/24/16 102/68  03/24/16 134/76  01/22/16 94/68    Wt Readings from Last 3 Encounters:  03/24/16 205 lb 9.6 oz (93.3 kg)  03/14/16 175 lb (79.4 kg)  02/01/16 210 lb 12.8 oz (95.6 kg)    Physical Exam  Constitutional: She is oriented to person, place, and time. She appears well-developed.  Neck: Normal range of motion. Neck supple. No thyromegaly present.  Cardiovascular: Normal rate.   Pulses:      Radial pulses are 2+ on the right side, and 2+ on the left side.  Pulmonary/Chest: Effort normal.  Musculoskeletal: She exhibits tenderness. She exhibits no edema.       Right shoulder: She exhibits tenderness, bony tenderness and pain. She exhibits normal range of motion, no swelling, no effusion, no crepitus, no spasm, normal pulse and normal strength.       Right elbow: Normal.  Right wrist: Normal.       Cervical back: Normal.       Thoracic back: Normal.  Lymphadenopathy:    She has no cervical adenopathy.  Neurological: She is alert and oriented to person, place, and time.  Skin: Skin is warm and dry. No rash noted. No erythema.  Vitals reviewed.   Lab Results  Component Value Date   WBC 6.3 01/04/2016   HGB 13.4 01/04/2016   HCT 38.9 01/04/2016   PLT 289.0 01/04/2016   GLUCOSE 89 03/21/2016   CHOL 165 03/21/2016   TRIG 83 03/21/2016   HDL 50 03/21/2016   LDLDIRECT 184.9  08/04/2012   LDLCALC 98 03/21/2016   ALT 29 03/21/2016   AST 29 03/21/2016   NA 140 03/21/2016   K 4.3 03/21/2016   CL 103 03/21/2016   CREATININE 0.55 03/21/2016   BUN 6 (L) 03/21/2016   CO2 27 03/21/2016   TSH 0.68 01/04/2016   INR 0.99 05/24/2015   Reviewed cervical MRI done 12/2014: IMPRESSION:  This is an abnormal MRI of the cervical spine with and without contrast showed the following: 1.  Mild degenerative changes at C3-C4 with disc bulging, C4-C5 with uncovertebral spurring and disc bulging and at C5-C6 with disc bulging. There is no significant foraminal narrowing and no nerve root impingement. The changes at C4-C5 have progressed since the MRI dated 06/20/2004 with uncovertebral spurring noted on the current study. 2.  The spinal cord appears normal. This is unchanged when compared to the prior MRI No results found.  Assessment & Plan:  Advised patient about possibility of rotator cuff impingement syndrome vs cervical radiculopathy, and treatment with oral prednisone. Patient declined use of oral prednisone. She only want shoulder X-ray to determine if there is any abnormality in joint.  Afaf was seen today for shoulder pain.  Diagnoses and all orders for this visit:  Pain of right shoulder joint on movement -     DG Shoulder Right; Future  Radiculopathy of arm   I am having Ms. Kennerly maintain her ibuprofen, cholecalciferol, ranitidine, cyclobenzaprine, brimonidine-timolol, ALPRAZolam, simvastatin, and ZETIA.  Meds ordered this encounter  Medications  . simvastatin (ZOCOR) 40 MG tablet    Sig: Take 40 mg by mouth daily.    Refill:  2  . ZETIA 10 MG tablet    Sig: Take 1 tablet by mouth daily.    Refill:  2     Follow-up: Return if symptoms worsen or fail to improve.  Wilfred Lacy, NP

## 2016-04-24 NOTE — Progress Notes (Signed)
Pre visit review using our clinic review tool, if applicable. No additional management support is needed unless otherwise documented below in the visit note. 

## 2016-04-24 NOTE — Patient Instructions (Signed)

## 2016-04-25 ENCOUNTER — Telehealth: Payer: Self-pay

## 2016-04-25 NOTE — Telephone Encounter (Signed)
Left message advising patient that xray was normal---if she has changed her mind she can try oral steroids---advised her to call back if she would like to try that---only other option could possibly be to see dr Tamala Julian, sports medicine---patient was already given exercises to try, so not sure if dr Tamala Julian could give any other options---can talk with Kaylen Motl if questions

## 2016-07-23 DIAGNOSIS — H401132 Primary open-angle glaucoma, bilateral, moderate stage: Secondary | ICD-10-CM | POA: Diagnosis not present

## 2016-10-07 ENCOUNTER — Encounter: Payer: Self-pay | Admitting: Internal Medicine

## 2016-10-07 DIAGNOSIS — Z803 Family history of malignant neoplasm of breast: Secondary | ICD-10-CM | POA: Diagnosis not present

## 2016-10-07 DIAGNOSIS — Z1231 Encounter for screening mammogram for malignant neoplasm of breast: Secondary | ICD-10-CM | POA: Diagnosis not present

## 2016-10-21 ENCOUNTER — Encounter: Payer: Self-pay | Admitting: Nurse Practitioner

## 2016-10-21 ENCOUNTER — Ambulatory Visit (INDEPENDENT_AMBULATORY_CARE_PROVIDER_SITE_OTHER): Payer: BLUE CROSS/BLUE SHIELD | Admitting: Nurse Practitioner

## 2016-10-21 VITALS — BP 118/76 | HR 77 | Temp 98.7°F | Ht 63.5 in | Wt 219.0 lb

## 2016-10-21 DIAGNOSIS — J9801 Acute bronchospasm: Secondary | ICD-10-CM | POA: Diagnosis not present

## 2016-10-21 DIAGNOSIS — R6889 Other general symptoms and signs: Secondary | ICD-10-CM

## 2016-10-21 LAB — POCT INFLUENZA A/B
Influenza A, POC: NEGATIVE
Influenza B, POC: NEGATIVE

## 2016-10-21 MED ORDER — ALBUTEROL SULFATE HFA 108 (90 BASE) MCG/ACT IN AERS: 2.0000 | INHALATION_SPRAY | Freq: Four times a day (QID) | RESPIRATORY_TRACT | 0 refills | Status: DC | PRN

## 2016-10-21 MED ORDER — OSELTAMIVIR PHOSPHATE 75 MG PO CAPS: 75.0000 mg | ORAL_CAPSULE | Freq: Two times a day (BID) | ORAL | 0 refills | Status: DC

## 2016-10-21 MED ORDER — IPRATROPIUM BROMIDE 0.03 % NA SOLN: 2.0000 | Freq: Two times a day (BID) | NASAL | 0 refills | Status: DC

## 2016-10-21 MED ORDER — GUAIFENESIN-DM 100-10 MG/5ML PO SYRP: 5.0000 mL | ORAL_SOLUTION | ORAL | 0 refills | Status: DC | PRN

## 2016-10-21 MED ORDER — PROMETHAZINE-DM 6.25-15 MG/5ML PO SYRP: 5.0000 mL | ORAL_SOLUTION | Freq: Three times a day (TID) | ORAL | 0 refills | Status: DC | PRN

## 2016-10-21 MED ORDER — ALBUTEROL SULFATE (2.5 MG/3ML) 0.083% IN NEBU
2.5000 mg | INHALATION_SOLUTION | Freq: Once | RESPIRATORY_TRACT | Status: AC
  Administered 2016-10-21: 2.5 mg via RESPIRATORY_TRACT

## 2016-10-21 NOTE — Progress Notes (Signed)
Pre visit review using our clinic review tool, if applicable. No additional management support is needed unless otherwise documented below in the visit note. 

## 2016-10-21 NOTE — Progress Notes (Signed)
Subjective:  Patient ID: [name], female    DOB: 1966-07-15  Age: 51 y.o. MRN: [id number]  CC: Cough (dry cough,chest congestion/pressure. started yesterday. took theraflu)   Cough  This is a new problem. The current episode started yesterday. The problem has been gradually worsening. The problem occurs constantly. The cough is non-productive. Associated symptoms include chest pain, chills, ear congestion, a fever, myalgias, nasal congestion, postnasal drip, rhinorrhea and a sore throat. Pertinent negatives include no ear pain, heartburn, hemoptysis, shortness of breath or wheezing. The symptoms are aggravated by lying down and cold air. She has tried OTC cough suppressant for the symptoms. The treatment provided no relief. There is no history of bronchitis.    Outpatient Medications Prior to Visit  Medication Sig Dispense Refill  . ALPRAZolam (XANAX) 0.5 MG tablet TAKE 1/2 TABLET BY MOUTH TWICE DAILY AS NEEDED FOR ANXIETY 60 tablet 2  . brimonidine-timolol (COMBIGAN) 0.2-0.5 % ophthalmic solution Place 1 drop into both eyes every 12 (twelve) hours.    . cholecalciferol (VITAMIN D) 1000 UNITS tablet Take 1,000 Units by mouth daily.    . cyclobenzaprine (FLEXERIL) 5 MG tablet Take 1 tablet (5 mg total) by mouth 3 (three) times daily as needed for muscle spasms. 60 tablet 2  . ibuprofen (ADVIL,MOTRIN) 200 MG tablet Take 400 mg by mouth every 6 (six) hours as needed for fever, headache or mild pain.    . ranitidine (ZANTAC) 75 MG tablet Take 150 mg by mouth daily as needed for heartburn.     . simvastatin (ZOCOR) 40 MG tablet Take 40 mg by mouth daily.  2  . ZETIA 10 MG tablet Take 1 tablet by mouth daily.  2   No facility-administered medications prior to visit.     ROS See HPI  Objective:  BP 118/76   Pulse 77   Temp 98.7 F (37.1 C)   Ht 5' 3.5" (1.613 m)   Wt 219 lb (99.3 kg)   SpO2 99%   BMI 38.19 kg/m   BP Readings from Last 3 Encounters:  10/21/16 118/76    04/24/16 102/68  03/24/16 134/76    Wt Readings from Last 3 Encounters:  10/21/16 219 lb (99.3 kg)  03/24/16 205 lb 9.6 oz (93.3 kg)  03/14/16 175 lb (79.4 kg)    Physical Exam  Constitutional: She is oriented to person, place, and time.  HENT:  Right Ear: Tympanic membrane, external ear and ear canal normal.  Left Ear: Tympanic membrane, external ear and ear canal normal.  Nose: Mucosal edema and rhinorrhea present. Right sinus exhibits maxillary sinus tenderness. Right sinus exhibits no frontal sinus tenderness. Left sinus exhibits maxillary sinus tenderness. Left sinus exhibits no frontal sinus tenderness.  Mouth/Throat: Uvula is midline. No trismus in the jaw. Posterior oropharyngeal erythema present. No oropharyngeal exudate.  Eyes: No scleral icterus.  Neck: Normal range of motion. Neck supple.  Cardiovascular: Normal rate and normal heart sounds.   Pulmonary/Chest: Effort normal. She has wheezes.  Musculoskeletal: She exhibits no edema.  Lymphadenopathy:    She has cervical adenopathy.  Neurological: She is alert and oriented to person, place, and time.  Vitals reviewed.   Lab Results  Component Value Date   WBC 6.3 01/04/2016   HGB 13.4 01/04/2016   HCT 38.9 01/04/2016   PLT 289.0 01/04/2016   GLUCOSE 89 03/21/2016   CHOL 165 03/21/2016   TRIG 83 03/21/2016   HDL 50 03/21/2016   LDLDIRECT 184.9 08/04/2012   LDLCALC 98  03/21/2016   ALT 29 03/21/2016   AST 29 03/21/2016   NA 140 03/21/2016   K 4.3 03/21/2016   CL 103 03/21/2016   CREATININE 0.55 03/21/2016   BUN 6 (L) 03/21/2016   CO2 27 03/21/2016   TSH 0.68 01/04/2016   INR 0.99 05/24/2015    Dg Shoulder Right  Result Date: 04/24/2016 CLINICAL DATA:  Right shoulder pain with movement associated with arm numbness; no known injury; duration of symptoms 3 weeks ; history of cervical disc disease. EXAM: RIGHT SHOULDER - 2+ VIEW COMPARISON:  PA chest x-ray dated October 28, 2015 which included the right  shoulder. FINDINGS: The bones are subjectively adequately mineralized. There is no acute or healing fracture. The glenohumeral joint space is preserved. The articular surfaces of the glenoid and humeral head appear normal. The AC joint is unremarkable. The subacromial subdeltoid space is normal. IMPRESSION: There is no acute or significant chronic bony abnormality of the right shoulder. Electronically Signed   By: David  Martinique M.D.   On: 04/24/2016 16:36    Assessment & Plan:   [name] was seen today for cough.  Diagnoses and all orders for this visit:  Flu-like symptoms -     POCT Influenza A/B -     albuterol (PROVENTIL) (2.5 MG/3ML) 0.083% nebulizer solution 2.5 mg; Take 3 mLs (2.5 mg total) by nebulization once. -     guaiFENesin-dextromethorphan (ROBITUSSIN DM) 100-10 MG/5ML syrup; Take 5 mLs by mouth every 4 (four) hours as needed for cough. -     ipratropium (ATROVENT) 0.03 % nasal spray; Place 2 sprays into both nostrils 2 (two) times daily. Do not use for more than 5days. -     promethazine-dextromethorphan (PROMETHAZINE-DM) 6.25-15 MG/5ML syrup; Take 5 mLs by mouth 3 (three) times daily as needed for cough. -     albuterol (PROVENTIL HFA;VENTOLIN HFA) 108 (90 Base) MCG/ACT inhaler; Inhale 2 puffs into the lungs every 6 (six) hours as needed for wheezing or shortness of breath. -     oseltamivir (TAMIFLU) 75 MG capsule; Take 1 capsule (75 mg total) by mouth 2 (two) times daily.  Cough due to bronchospasm -     albuterol (PROVENTIL) (2.5 MG/3ML) 0.083% nebulizer solution 2.5 mg; Take 3 mLs (2.5 mg total) by nebulization once.   I am having [name] start on guaiFENesin-dextromethorphan, ipratropium, promethazine-dextromethorphan, albuterol, and oseltamivir. I am also having her maintain her ibuprofen, cholecalciferol, ranitidine, cyclobenzaprine, brimonidine-timolol, ALPRAZolam, simvastatin, and ZETIA. We administered albuterol.  Meds ordered this encounter  Medications  .  albuterol (PROVENTIL) (2.5 MG/3ML) 0.083% nebulizer solution 2.5 mg  . guaiFENesin-dextromethorphan (ROBITUSSIN DM) 100-10 MG/5ML syrup    Sig: Take 5 mLs by mouth every 4 (four) hours as needed for cough.    Dispense:  118 mL    Refill:  0    Order Specific Question:   Supervising Provider    Answer:   Cassandria Anger [[id number]]  . ipratropium (ATROVENT) 0.03 % nasal spray    Sig: Place 2 sprays into both nostrils 2 (two) times daily. Do not use for more than 5days.    Dispense:  30 mL    Refill:  0    Order Specific Question:   Supervising Provider    Answer:   Cassandria Anger [[id number]]  . promethazine-dextromethorphan (PROMETHAZINE-DM) 6.25-15 MG/5ML syrup    Sig: Take 5 mLs by mouth 3 (three) times daily as needed for cough.    Dispense:  240 mL    Refill:  0    Order Specific Question:   Supervising Provider    Answer:   Cassandria Anger [[id number]]  . albuterol (PROVENTIL HFA;VENTOLIN HFA) 108 (90 Base) MCG/ACT inhaler    Sig: Inhale 2 puffs into the lungs every 6 (six) hours as needed for wheezing or shortness of breath.    Dispense:  1 Inhaler    Refill:  0    Order Specific Question:   Supervising Provider    Answer:   Cassandria Anger [[id number]]  . oseltamivir (TAMIFLU) 75 MG capsule    Sig: Take 1 capsule (75 mg total) by mouth 2 (two) times daily.    Dispense:  10 capsule    Refill:  0    Order Specific Question:   Supervising Provider    Answer:   Cassandria Anger [[id number]]    Follow-up: No Follow-up on file.  Wilfred Lacy, NP

## 2016-10-21 NOTE — Patient Instructions (Addendum)
URI Instructions: Encourage adequate oral hydration.  Use over-the-counter  "cold" medicines  such as "Tylenol cold" , "Advil cold",  "Mucinex" or" Mucinex D"  for cough and congestion.  Avoid decongestants if you have high blood pressure. Use" Delsym" or" Robitussin" cough syrup varietis for cough.  You can use plain "Tylenol" or "Advi"l for fever, chills and achyness.   "Common cold" symptoms are usually triggered by a virus.  The antibiotics are usually not necessary. On average, a" viral cold" illness would take 4-7 days to resolve. Please, make an appointment if you are not better or if you're worse.  Call office if no improvement in 1week.

## 2016-10-29 DIAGNOSIS — H401131 Primary open-angle glaucoma, bilateral, mild stage: Secondary | ICD-10-CM | POA: Diagnosis not present

## 2016-10-29 DIAGNOSIS — H4423 Degenerative myopia, bilateral: Secondary | ICD-10-CM | POA: Diagnosis not present

## 2016-11-13 ENCOUNTER — Encounter: Payer: Self-pay | Admitting: Internal Medicine

## 2016-11-13 ENCOUNTER — Ambulatory Visit (INDEPENDENT_AMBULATORY_CARE_PROVIDER_SITE_OTHER): Payer: BLUE CROSS/BLUE SHIELD | Admitting: Internal Medicine

## 2016-11-13 VITALS — BP 106/74 | HR 75 | Ht 63.5 in | Wt 213.8 lb

## 2016-11-13 DIAGNOSIS — R002 Palpitations: Secondary | ICD-10-CM

## 2016-11-13 DIAGNOSIS — E7801 Familial hypercholesterolemia: Secondary | ICD-10-CM

## 2016-11-13 DIAGNOSIS — I2510 Atherosclerotic heart disease of native coronary artery without angina pectoris: Secondary | ICD-10-CM

## 2016-11-13 DIAGNOSIS — M542 Cervicalgia: Secondary | ICD-10-CM

## 2016-11-13 MED ORDER — IBUPROFEN 800 MG PO TABS: 800.0000 mg | ORAL_TABLET | Freq: Two times a day (BID) | ORAL | 0 refills | Status: DC

## 2016-11-13 NOTE — Patient Instructions (Signed)
Dr. Debara Pickett has prescribed: Ibuprofen 800mg  twice daily for TWO WEEKS  Your physician recommends that you return for lab work FASTING to check cholesterol   Your physician wants you to follow-up in: ONE YEAR with Dr. Debara Pickett. You will receive a reminder letter in the mail two months in advance. If you don't receive a letter, please call our office to schedule the follow-up appointment.

## 2016-11-13 NOTE — Progress Notes (Signed)
OFFICE NOTE  Chief Complaint:  No complaints  Primary Care Physician: Cathlean Cower, MD  HPI:  [name] is a pleasant 51 year old female who has been suffering with symptoms of chest pain and palpitations for quite some time. Apparently in 2008 she had an episode of chest pain and was admitted to the hospital. She was treated and ruled out and continued to have very intermittent episodes of discomfort over the years. Recently she had some worsening symptoms including chest discomfort and feeling of palpitations. This is followed by significant fatigue and lack of energy for several days after the palpitations have resolved. It is associated with heaviness in the chest that is behind the sternum. It does not radiate to the arm or to the jaw. Sometimes the symptoms are worse when she is exerting herself and improve at rest. Other times they occur at rest. She had recently seen Dr. Terrence Dupont, for a cardiac opinion. He ordered a stress test which was performed at [facility]. 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 No evidence for inducible ischemia. Normal function with an EF of 66%".  This was interpreted by a Development worker, community.  Apparently she received a call saying her stress test was negative and she was told to followup with him in the office in 3 weeks. When she came back she was allegedly told that her stress test showed a prior heart attack. There does however appear to be conflicting data. Based on this she is seeking another opinion, especially in light of recurrent symptoms. She does report that the palpitation episodes are actually fairly infrequent. She says she's had about 3 episodes of this year and perhaps 2 or 3 episodes last year.  [name] returns today for follow-up. After undergoing noninvasive testing which was equivocal,  there was still some concern for possible coronary ischemia. She underwent definitive cardiac catheterization by myself which demonstrated normal coronary arteries. Based on these findings I'm not suspicious that her chest pain is cardiac. Additionally, she has few risk factors for variant angina or small vessel disease. She does have migraine headaches which could put her at risk for coronary spasm. Treatments for possible coronary spasm include beta blockers, calcium channel blockers or nitrates. She seems to have had problems with low blood pressure in the past and I did not think will tolerate these medicines. She was started on low-dose propranolol for migraines and she said she only took 1 pill and felt horrible with it. Therefore, I do not feel she'll tolerate a beta blocker. Her discomfort in the chest is not lifestyle or activity limiting.  01/22/2016  [name] was seen today in the office. She was sent back for follow-up due to recent ER visit where she was shown to have artery artery calcification on her CT however her cardiac catheterization 2015 showed angiographically normal coronaries. 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 Recently she had reassessment of her cholesterol by her primary care provider. She had been intolerant of statin therapy, in fact had not tolerated both Lipitor and lovastatin in the past. Her total cholesterol was 305, triglycerides 61, HDL 51 and LDL 241. 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 There is a history of coronary disease in her family. She has early coronary  artery calcifications at age 33 which is considered premature coronary artery disease although she did not have significant obstruction she will need aggressive lipid therapy. Recently she was started on Crestor 20 mg by primary care provider but had side effects from this which went away  after being off of it for a week. She was then switched to simvastatin 20 mg which so far she is tolerating. I did discuss the lipid lowering properties of simvastatin the fact that she needs more aggressive lowering of LDL cholesterol with a goal LDL of at least 70 if not 50 based on new guidelines, which she will never reached on statin therapy alone. Currently she is on max tolerated statin therapy. I like to add said he had 10 mg to her regimen. We'll also refer her to our lipid clinic as she is a good candidate for the PCSK9 inhibitor Repatha. Her calculated DUTCH score is 6 which is suggestive of FH. She also reports some daytime fatigue, somnolence and difficulty sleeping at night. She's been noted to awaken suddenly with gasping concerning for possible sleep apnea.  03/24/2016  [name] is seen back today in the office. She's had a marked improved prove minute and her cholesterol on Zocor and that he had. Amazingly she's had more than a 50% reduction was suggested there may been a dietary component as well. Although it looks like she may have FH, her cholesterol is fairly well-controlled on her current regimen and therefore do not believe we need to pursue Repatha at this time. Recently she had her sleep study interpreted which indicated no evidence of obstructive sleep apnea. This is reassuring. She continues to exercise but is not seen a major weight reduction. Of encouraged her to keep that up because she is making some progress with regards to her health without question.  11/13/2016  [name] returns today for follow-up. Overall she seems to be doing well. She denies any chest pain or worsening shortness of breath. She is only on monotherapy simvastatin. What tried her on that area but she reported intolerance to that. She is due for repeat lipid profile. Unfortunately this been re-gain of weight from 205 up to 213 pounds. She says she is to start working at that heart her with more exercise  and dietary changes. She is also complaining of some left neck and left arm pain which is getting somewhat better but it's difficult for her to move her neck to the left.  PMHx:  Past Medical History:  Diagnosis Date  . ANXIETY 03/27/2010  . BACK PAIN, CHRONIC 03/27/2010  . Classic migraine with aura 05/30/2015  . COMMON MIGRAINE 03/27/2010  . [place] DISEASE, CERVICAL 03/27/2010  . FATIGUE 03/27/2010  . GERD 03/27/2010  . Glaucoma    bilateral  . HYPERLIPIDEMIA 03/27/2010  . Multiple sclerosis ([place]) 10/23/2010   unconfirmed, but suspected  . Osteoporosis 09/24/2012  . Unspecified vitamin D deficiency 10/23/2010    Past Surgical History:  Procedure Laterality Date  . ABDOMINAL HYSTERECTOMY    . LEFT HEART CATHETERIZATION WITH CORONARY ANGIOGRAM N/A 03/31/2014   Procedure: LEFT HEART CATHETERIZATION WITH CORONARY ANGIOGRAM;  Surgeon: Pixie Casino, MD;  [facility] C[facility] LAB;  Service: Cardiovascular;  Laterality: N/A;  . Neg stress test  2007  . s/p eye surgury    . s/p knee surgury      FAMHx:  Family History  Problem Relation Age of Onset  . Heart disease Father   . Diabetes Father   .  Hyperlipidemia Father   . Hypertension Father   . Diabetes Other   . Arthritis Other   . Dementia Other   . Cancer Other     2 uncles with lung cancer  . Heart disease Other     2 uncles with heart disease  . Cancer Mother     breast  . Hyperlipidemia Mother   . Lupus Maternal Aunt   . Lupus Cousin     mother and father's side    SOCHx:   reports that she has never smoked. She has never used smokeless tobacco. She reports that she does not drink alcohol or use drugs.  ALLERGIES:  Allergies  Allergen Reactions  . Augmentin [Amoxicillin-Pot Clavulanate] Rash and Other (See Comments)    Pt stated "throat felt funny"  . Crestor [Rosuvastatin Calcium] Other (See Comments)    Myalgias   . Lipitor [Atorvastatin] Other (See Comments)    myalgia  . Lovastatin Other (See Comments)     myalgias  . Statins Nausea And Vomiting  . Topamax Other (See Comments)    dizziness    ROS: Pertinent items noted in HPI and remainder of comprehensive ROS otherwise negative.  HOME MEDS: Current Outpatient Prescriptions  Medication Sig Dispense Refill  . albuterol (PROVENTIL HFA;VENTOLIN HFA) 108 (90 Base) MCG/ACT inhaler Inhale 2 puffs into the lungs every 6 (six) hours as needed for wheezing or shortness of breath. 1 Inhaler 0  . brimonidine-timolol (COMBIGAN) 0.2-0.5 % ophthalmic solution Place 1 drop into both eyes every 12 (twelve) hours.    . cholecalciferol (VITAMIN D) 1000 UNITS tablet Take 1,000 Units by mouth daily.    . cyclobenzaprine (FLEXERIL) 5 MG tablet Take 1 tablet (5 mg total) by mouth 3 (three) times daily as needed for muscle spasms. 60 tablet 2  . ibuprofen (ADVIL,MOTRIN) 200 MG tablet Take 400 mg by mouth every 6 (six) hours as needed for fever, headache or mild pain.    Marland Kitchen ipratropium (ATROVENT) 0.03 % nasal spray Place 2 sprays into both nostrils 2 (two) times daily. Do not use for more than 5days. 30 mL 0  . simvastatin (ZOCOR) 40 MG tablet Take 40 mg by mouth daily.  2  . ibuprofen (ADVIL,MOTRIN) 800 MG tablet Take 1 tablet (800 mg total) by mouth 2 (two) times daily. Take for 2 weeks 28 tablet 0   No current facility-administered medications for this visit.     LABS/IMAGING: No results found for this or any previous visit (from the past 48 hour(s)). No results found.  VITALS: BP 106/74   Pulse 75   Ht 5' 3.5" (1.613 m)   Wt 213 lb 12.8 oz (97 kg)   BMI 37.28 kg/m   EXAM: General appearance: alert and no distress Lungs: clear to auscultation bilaterally Heart: regular rate and rhythm, S1, S2 normal, no murmur, click, rub or gallop Extremities: extremities normal, atraumatic, no cyanosis or edema Neurologic: Grossly normal  EKG: Normal sinus rhythm at 75  ASSESSMENT: 1. Persistent chest pain and palpitations - normal coronary arteries by  catheterization (2015) 2. Coronary artery calcification on CT scan 3. Possible history of multiple sclerosis 4. Possible Familial hypercholesterolemia - improved on Zocor and Zetia  PLAN: 1.   [name] has had improvement in her cholesterol but discontinued Zetia due to perceived side effects. She continues on simvastatin 40. I suspect with her weight gain and not taking, her cholesterol may be higher. I like to recheck that and we can discuss further management  options. Finally, I'll prescribe ibuprofen 800 mg twice daily for 2 weeks to see if this helps with her neck pain and stiffness.  Follow-up annually or sooner as necessary.  Pixie Casino, MD, [facility] Attending Cardiologist [place] [facility] [place] 11/13/2016, 5:20 PM

## 2016-11-14 LAB — LIPID PANEL
Cholesterol: 148 mg/dL (ref <200)
HDL: 55 mg/dL (ref >50)
LDL CALC: 79 mg/dL (ref <100)
Total CHOL/HDL Ratio: 2.7 Ratio (ref <5.0)
Triglycerides: 69 mg/dL (ref <150)
VLDL: 14 mg/dL (ref <30)

## 2016-12-03 DIAGNOSIS — D649 Anemia, unspecified: Secondary | ICD-10-CM | POA: Diagnosis not present

## 2016-12-03 DIAGNOSIS — Z01419 Encounter for gynecological examination (general) (routine) without abnormal findings: Secondary | ICD-10-CM | POA: Diagnosis not present

## 2016-12-03 DIAGNOSIS — Z1389 Encounter for screening for other disorder: Secondary | ICD-10-CM | POA: Diagnosis not present

## 2016-12-17 ENCOUNTER — Ambulatory Visit (INDEPENDENT_AMBULATORY_CARE_PROVIDER_SITE_OTHER): Payer: BLUE CROSS/BLUE SHIELD | Admitting: Family Medicine

## 2016-12-17 ENCOUNTER — Encounter: Payer: Self-pay | Admitting: Family Medicine

## 2016-12-17 VITALS — BP 107/83 | HR 71 | Temp 98.2°F | Ht 63.5 in | Wt 212.0 lb

## 2016-12-17 DIAGNOSIS — R1031 Right lower quadrant pain: Secondary | ICD-10-CM | POA: Diagnosis not present

## 2016-12-17 DIAGNOSIS — I2510 Atherosclerotic heart disease of native coronary artery without angina pectoris: Secondary | ICD-10-CM

## 2016-12-17 LAB — POC URINALSYSI DIPSTICK (AUTOMATED)
Bilirubin, UA: NEGATIVE
Blood, UA: NEGATIVE
Glucose, UA: NEGATIVE
KETONES UA: NEGATIVE
LEUKOCYTES UA: NEGATIVE
NITRITE UA: NEGATIVE
PH UA: 6 (ref 5.0–8.0)
PROTEIN UA: NEGATIVE
Spec Grav, UA: <=1.005 — AB (ref 1.010–1.025)
UROBILINOGEN UA: 0.2 U/dL

## 2016-12-17 NOTE — Progress Notes (Signed)
Pre visit review using our clinic review tool, if applicable. No additional management support is needed unless otherwise documented below in the visit note. 

## 2016-12-17 NOTE — Addendum Note (Signed)
Addended by: Aggie Hacker A on: 12/17/2016 04:07 PM   Modules accepted: Orders

## 2016-12-17 NOTE — Patient Instructions (Signed)
WE NOW OFFER   [facility] FAST TRACK!!!  SAME DAY Appointments for ACUTE CARE  Such as: Sprains, Injuries, cuts, abrasions, rashes, muscle pain, joint pain, back pain Colds, flu, sore throats, headache, allergies, cough, fever  Ear pain, sinus and eye infections Abdominal pain, nausea, vomiting, diarrhea, upset stomach Animal/insect bites  3 Easy Ways to Schedule: Walk-In Scheduling Call in scheduling Mychart Sign-up: [email or web address]         

## 2016-12-17 NOTE — Progress Notes (Signed)
   Subjective:    Patient ID: [name], female    DOB: April 28, 1966, 51 y.o.   MRN: [id number]  HPI Here for worsening intermittent RLQ pains. These started in February and she had them on 2 or 3 occasions. She had a few more episodes in March. Then about 2 weeks they returned, and she has been experiencing them every day since then. They are sharp in nature, they are in the exact same spot in the RLQ, and they do not radiate. They can be mild but they can also be severe, causing her to buckle over with pain. Sometimes she feels nausea with them but has never vomited. No fevers. No urinary symptoms. Her BMs are normal. She uses Metamucil every day and drinks plenty of fluids. The interesting factor is the pains are triggered by eating food. She says anytime she eats any type of food, even in small amounts, these pains start within seconds of swallowing it. By history, she has had a hysterectomy due to endometriosis.    Review of Systems  Constitutional: Negative.   Respiratory: Negative.   Cardiovascular: Negative.   Gastrointestinal: Positive for abdominal pain. Negative for abdominal distention, anal bleeding, blood in stool, constipation, diarrhea, nausea, rectal pain and vomiting.  Genitourinary: Negative.        Objective:   Physical Exam  Constitutional: She appears well-developed and well-nourished. No distress.  Cardiovascular: Normal rate, regular rhythm, normal heart sounds and intact distal pulses.   Pulmonary/Chest: Effort normal and breath sounds normal.  Abdominal: Soft. Bowel sounds are normal. She exhibits no distension and no mass. There is no rebound and no guarding.  Mildly tender in the RLQ           Assessment & Plan:  RLQ pains triggered by eating food, most likely due to adhesions from endometriosis. We will get labs today and will set her up for a contrasted CT scan soon.  Alysia Penna, MD

## 2016-12-18 LAB — CBC WITH DIFFERENTIAL/PLATELET
Basophils Absolute: 0.1 10*3/uL (ref 0.0–0.1)
Basophils Relative: 0.7 % (ref 0.0–3.0)
EOS PCT: 1.4 % (ref 0.0–5.0)
Eosinophils Absolute: 0.1 10*3/uL (ref 0.0–0.7)
HEMATOCRIT: 40.2 % (ref 36.0–46.0)
HEMOGLOBIN: 13.4 g/dL (ref 12.0–15.0)
Lymphocytes Relative: 42.9 % (ref 12.0–46.0)
Lymphs Abs: 3.1 10*3/uL (ref 0.7–4.0)
MCHC: 33.4 g/dL (ref 30.0–36.0)
MCV: 94.2 fl (ref 78.0–100.0)
MONOS PCT: 5.4 % (ref 3.0–12.0)
Monocytes Absolute: 0.4 10*3/uL (ref 0.1–1.0)
Neutro Abs: 3.6 10*3/uL (ref 1.4–7.7)
Neutrophils Relative %: 49.6 % (ref 43.0–77.0)
Platelets: 292 10*3/uL (ref 150.0–400.0)
RBC: 4.27 Mil/uL (ref 3.87–5.11)
RDW: 14 % (ref 11.5–15.5)
WBC: 7.3 10*3/uL (ref 4.0–10.5)

## 2016-12-18 LAB — BASIC METABOLIC PANEL
BUN: 12 mg/dL (ref 6–23)
CALCIUM: 9.5 mg/dL (ref 8.4–10.5)
CO2: 29 mEq/L (ref 19–32)
Chloride: 103 mEq/L (ref 96–112)
Creatinine, Ser: 0.57 mg/dL (ref 0.40–1.20)
GFR: 143.57 mL/min (ref >60.00)
GLUCOSE: 79 mg/dL (ref 70–99)
Potassium: 4.1 mEq/L (ref 3.5–5.1)
SODIUM: 138 meq/L (ref 135–145)

## 2016-12-18 LAB — HEPATIC FUNCTION PANEL
ALBUMIN: 4.1 g/dL (ref 3.5–5.2)
ALT: 21 U/L (ref 0–35)
AST: 23 U/L (ref 0–37)
Alkaline Phosphatase: 82 U/L (ref 39–117)
Bilirubin, Direct: 0.1 mg/dL (ref 0.0–0.3)
Total Bilirubin: 0.4 mg/dL (ref 0.2–1.2)
Total Protein: 7.5 g/dL (ref 6.0–8.3)

## 2016-12-19 ENCOUNTER — Ambulatory Visit (INDEPENDENT_AMBULATORY_CARE_PROVIDER_SITE_OTHER)
Admission: RE | Admit: 2016-12-19 | Discharge: 2016-12-19 | Disposition: A | Discharge status: Home / Self Care | Payer: BLUE CROSS/BLUE SHIELD | Source: Ambulatory Visit | Attending: Family Medicine | Admitting: Family Medicine

## 2016-12-19 DIAGNOSIS — R1031 Right lower quadrant pain: Secondary | ICD-10-CM | POA: Diagnosis not present

## 2016-12-19 MED ORDER — IOPAMIDOL (ISOVUE-300) INJECTION 61%
100.0000 mL | Freq: Once | INTRAVENOUS | Status: AC | PRN
  Administered 2016-12-19: 100 mL via INTRAVENOUS

## 2016-12-25 DIAGNOSIS — H4421 Degenerative myopia, right eye: Secondary | ICD-10-CM | POA: Diagnosis not present

## 2016-12-25 DIAGNOSIS — H353121 Nonexudative age-related macular degeneration, left eye, early dry stage: Secondary | ICD-10-CM | POA: Diagnosis not present

## 2016-12-25 DIAGNOSIS — H15842 Scleral ectasia, left eye: Secondary | ICD-10-CM | POA: Diagnosis not present

## 2016-12-25 DIAGNOSIS — H43391 Other vitreous opacities, right eye: Secondary | ICD-10-CM | POA: Diagnosis not present

## 2016-12-25 DIAGNOSIS — H15841 Scleral ectasia, right eye: Secondary | ICD-10-CM | POA: Diagnosis not present

## 2016-12-31 ENCOUNTER — Ambulatory Visit (INDEPENDENT_AMBULATORY_CARE_PROVIDER_SITE_OTHER)
Admission: RE | Admit: 2016-12-31 | Discharge: 2016-12-31 | Disposition: A | Discharge status: Home / Self Care | Payer: BLUE CROSS/BLUE SHIELD | Source: Ambulatory Visit | Attending: Internal Medicine | Admitting: Internal Medicine

## 2016-12-31 ENCOUNTER — Other Ambulatory Visit: Payer: Self-pay | Admitting: Internal Medicine

## 2016-12-31 ENCOUNTER — Ambulatory Visit (INDEPENDENT_AMBULATORY_CARE_PROVIDER_SITE_OTHER): Payer: BLUE CROSS/BLUE SHIELD | Admitting: Internal Medicine

## 2016-12-31 ENCOUNTER — Encounter: Payer: Self-pay | Admitting: Internal Medicine

## 2016-12-31 DIAGNOSIS — I2510 Atherosclerotic heart disease of native coronary artery without angina pectoris: Secondary | ICD-10-CM

## 2016-12-31 DIAGNOSIS — M79604 Pain in right leg: Secondary | ICD-10-CM

## 2016-12-31 MED ORDER — GABAPENTIN 100 MG PO CAPS: ORAL_CAPSULE | ORAL | 0 refills | Status: DC

## 2016-12-31 MED ORDER — PREDNISONE 10 MG PO TABS: ORAL_TABLET | ORAL | 0 refills | Status: DC

## 2016-12-31 MED ORDER — GABAPENTIN 300 MG PO CAPS: 300.0000 mg | ORAL_CAPSULE | Freq: Three times a day (TID) | ORAL | 3 refills | Status: DC

## 2016-12-31 NOTE — Patient Instructions (Signed)
Please take all new medication as prescribed - the prednisone, as well as the gabapentin 100 mg three times per day for 1 week  After this, it will be ok to increase the gabapentin to 300 mg three times per day  Please continue all other medications as before, and refills have been done if requested.  Please have the pharmacy call with any other refills you may need.  You will be contacted regarding the referral for: Neurology  Please keep your appointments with your specialists as you may have planned  Please go to the XRAY Department in the Basement (go straight as you get off the elevator) for the x-ray testing  You will be contacted by phone if any changes need to be made immediately.  Otherwise, you will receive a letter about your results with an explanation, but please check with MyChart first.  Please remember to sign up for MyChart if you have not done so, as this will be important to you in the future with finding out test results, communicating by private email, and scheduling acute appointments online when needed.

## 2016-12-31 NOTE — Progress Notes (Signed)
Pre visit review using our clinic review tool, if applicable. No additional management support is needed unless otherwise documented below in the visit note. 

## 2016-12-31 NOTE — Progress Notes (Signed)
Subjective:    Patient ID: [name], female    DOB: Jan 27, 1966, 51 y.o.   MRN: [id number]  HPI  Here with subacute onset RLE pain starting late jan 2018, off and on, though MSk and tried ice, heat, and advil but has persisted now gradually worse, so now since apr 1 has been essentially daily, and more severe,  Seems worse and may start about the right groin with twisting kind of pain to anterior thigh, but then getting numbness as well to the distal leg below the knee. Seen per provider about 2 wks ago who per pt thought appendix might be a concern, and ct abdpelvis neg for appendix or other, also for miralax for constipation but leg pain just not improved.  No leg swelling, no hx of DVT, and Pt denies chest pain, increased sob or doe, wheezing, orthopnea, PND, increased LE swelling, palpitations, dizziness or syncope.  Oveall pain now really limits her activity, not working, can only walk hsorter distances but is vague about this, and in fact denies pain this am, only has pain limiting when it hurts.  No change with sitting, standing.  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  No falls, no fever.  No prior hip joint issue or recent imaging. No recent femur films Past Medical History:  Diagnosis Date  . ANXIETY 03/27/2010  . BACK PAIN, CHRONIC 03/27/2010  . Classic migraine with aura 05/30/2015  . COMMON MIGRAINE 03/27/2010  . [place] DISEASE, CERVICAL 03/27/2010  . FATIGUE 03/27/2010  . GERD 03/27/2010  . Glaucoma    bilateral  . HYPERLIPIDEMIA 03/27/2010  . Multiple sclerosis ([place]) 10/23/2010   unconfirmed, but suspected  . Osteoporosis 09/24/2012  . Unspecified vitamin D deficiency 10/23/2010   Past Surgical History:  Procedure Laterality Date  . ABDOMINAL HYSTERECTOMY    . LEFT HEART CATHETERIZATION WITH CORONARY ANGIOGRAM N/A 03/31/2014   Procedure: LEFT HEART CATHETERIZATION WITH CORONARY ANGIOGRAM;  Surgeon: Pixie Casino, MD;  Location: [facility] CATH LAB;  Service:  Cardiovascular;  Laterality: N/A;  . Neg stress test  2007  . s/p eye surgury    . s/p knee surgury      reports that she has never smoked. She has never used smokeless tobacco. She reports that she does not drink alcohol or use drugs. family history includes Arthritis in her other; Cancer in her mother and other; Dementia in her other; Diabetes in her father and other; Heart disease in her father and other; Hyperlipidemia in her father and mother; Hypertension in her father; Lupus in her cousin and maternal aunt. Allergies  Allergen Reactions  . Augmentin [Amoxicillin-Pot Clavulanate] Rash and Other (See Comments)    Pt stated "throat felt funny"  . Crestor [Rosuvastatin Calcium] Other (See Comments)    Myalgias   . Lipitor [Atorvastatin] Other (See Comments)    myalgia  . Lovastatin Other (See Comments)    myalgias  . Statins Nausea And Vomiting  . Topamax Other (See Comments)    dizziness   Current Outpatient Prescriptions on File Prior to Visit  Medication Sig Dispense Refill  . albuterol (PROVENTIL HFA;VENTOLIN HFA) 108 (90 Base) MCG/ACT inhaler Inhale 2 puffs into the lungs every 6 (six) hours as needed for wheezing or shortness of breath. 1 Inhaler 0  . brimonidine-timolol (COMBIGAN) 0.2-0.5 % ophthalmic solution Place 1 drop into both eyes every 12 (twelve) hours.    . cholecalciferol (VITAMIN D) 1000 UNITS tablet Take 1,000 Units by mouth  daily.    . cyclobenzaprine (FLEXERIL) 5 MG tablet Take 1 tablet (5 mg total) by mouth 3 (three) times daily as needed for muscle spasms. 60 tablet 2  . ibuprofen (ADVIL,MOTRIN) 200 MG tablet Take 400 mg by mouth every 6 (six) hours as needed for fever, headache or mild pain.    Marland Kitchen ipratropium (ATROVENT) 0.03 % nasal spray Place 2 sprays into both nostrils 2 (two) times daily. Do not use for more than 5days. 30 mL 0  . simvastatin (ZOCOR) 40 MG tablet Take 40 mg by mouth daily.  2   No current facility-administered medications on file prior to  visit.    Review of Systems  Constitutional: Negative for other unusual diaphoresis or sweats HENT: Negative for ear discharge or swelling Eyes: Negative for other worsening visual disturbances Respiratory: Negative for stridor or other swelling  Gastrointestinal: Negative for worsening distension or other blood Genitourinary: Negative for retention or other urinary change Musculoskeletal: Negative for other MSK pain or swelling Skin: Negative for color change or other new lesions Neurological: Negative for worsening tremors and other numbness  Psychiatric/Behavioral: Negative for worsening agitation or other fatigue All other system neg per pt    Objective:   Physical Exam BP 100/70   Pulse 82   Ht 5\' 4"  (1.626 m)   Wt 207 lb (93.9 kg)   SpO2 100%   BMI 35.53 kg/m   VS noted,  Constitutional: Pt appears in NAD HENT: Head: NCAT.  Right Ear: External ear normal.  Left Ear: External ear normal.  Eyes: . Pupils are equal, round, and reactive to light. Conjunctivae and EOM are normal Nose: without d/c or deformity Neck: Neck supple. Gross normal ROM Cardiovascular: Normal rate and regular rhythm.   Pulmonary/Chest: Effort normal and breath sounds without rales or wheezing.  Spine - nontender midline throughout Right and left hips with FROM including ext and int rotation without pain elicited Abd:  Soft, NT, ND, + BS, no organomegaly - benign exam Neurological: Pt is alert. At baseline orientation, motor 5/5 intact, with equivocal reduced sens to right ant thigh Skin: Skin is warm. No rashes, other new lesions, no LE edema Psychiatric: Pt behavior is normal without agitation  No other exam findings    Assessment & Plan:

## 2016-12-31 NOTE — Assessment & Plan Note (Signed)
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

## 2017-01-07 ENCOUNTER — Ambulatory Visit (INDEPENDENT_AMBULATORY_CARE_PROVIDER_SITE_OTHER): Payer: BLUE CROSS/BLUE SHIELD | Admitting: Internal Medicine

## 2017-01-07 ENCOUNTER — Other Ambulatory Visit (INDEPENDENT_AMBULATORY_CARE_PROVIDER_SITE_OTHER): Payer: BLUE CROSS/BLUE SHIELD

## 2017-01-07 ENCOUNTER — Encounter: Payer: Self-pay | Admitting: Internal Medicine

## 2017-01-07 VITALS — BP 104/78 | HR 78 | Ht 64.0 in | Wt 207.0 lb

## 2017-01-07 DIAGNOSIS — Z0000 Encounter for general adult medical examination without abnormal findings: Secondary | ICD-10-CM

## 2017-01-07 DIAGNOSIS — Z0001 Encounter for general adult medical examination with abnormal findings: Secondary | ICD-10-CM

## 2017-01-07 DIAGNOSIS — N951 Menopausal and female climacteric states: Secondary | ICD-10-CM | POA: Insufficient documentation

## 2017-01-07 LAB — URINALYSIS, ROUTINE W REFLEX MICROSCOPIC
BILIRUBIN URINE: NEGATIVE
HGB URINE DIPSTICK: NEGATIVE
Ketones, ur: NEGATIVE
NITRITE: NEGATIVE
RBC / HPF: NONE SEEN (ref 0–?)
Specific Gravity, Urine: 1.02 (ref 1.000–1.030)
Total Protein, Urine: NEGATIVE
Urine Glucose: NEGATIVE
Urobilinogen, UA: 0.2 (ref 0.0–1.0)
pH: 6.5 (ref 5.0–8.0)

## 2017-01-07 LAB — CBC WITH DIFFERENTIAL/PLATELET
BASOS PCT: 1 % (ref 0.0–3.0)
Basophils Absolute: 0.1 10*3/uL (ref 0.0–0.1)
EOS PCT: 1.6 % (ref 0.0–5.0)
Eosinophils Absolute: 0.1 10*3/uL (ref 0.0–0.7)
HEMATOCRIT: 41 % (ref 36.0–46.0)
HEMOGLOBIN: 13.7 g/dL (ref 12.0–15.0)
LYMPHS PCT: 37 % (ref 12.0–46.0)
Lymphs Abs: 2 10*3/uL (ref 0.7–4.0)
MCHC: 33.4 g/dL (ref 30.0–36.0)
MCV: 94.6 fl (ref 78.0–100.0)
MONO ABS: 0.4 10*3/uL (ref 0.1–1.0)
MONOS PCT: 8.4 % (ref 3.0–12.0)
Neutro Abs: 2.8 10*3/uL (ref 1.4–7.7)
Neutrophils Relative %: 52 % (ref 43.0–77.0)
Platelets: 251 10*3/uL (ref 150.0–400.0)
RBC: 4.34 Mil/uL (ref 3.87–5.11)
RDW: 13.9 % (ref 11.5–15.5)
WBC: 5.3 10*3/uL (ref 4.0–10.5)

## 2017-01-07 LAB — BASIC METABOLIC PANEL
BUN: 10 mg/dL (ref 6–23)
CHLORIDE: 105 meq/L (ref 96–112)
CO2: 28 meq/L (ref 19–32)
Calcium: 9.5 mg/dL (ref 8.4–10.5)
Creatinine, Ser: 0.64 mg/dL (ref 0.40–1.20)
GFR: 125.58 mL/min (ref >60.00)
GLUCOSE: 91 mg/dL (ref 70–99)
POTASSIUM: 4.3 meq/L (ref 3.5–5.1)
SODIUM: 137 meq/L (ref 135–145)

## 2017-01-07 LAB — HEPATIC FUNCTION PANEL
ALT: 12 U/L (ref 0–35)
AST: 18 U/L (ref 0–37)
Albumin: 4 g/dL (ref 3.5–5.2)
Alkaline Phosphatase: 73 U/L (ref 39–117)
BILIRUBIN TOTAL: 0.5 mg/dL (ref 0.2–1.2)
Bilirubin, Direct: 0.1 mg/dL (ref 0.0–0.3)
Total Protein: 7.4 g/dL (ref 6.0–8.3)

## 2017-01-07 LAB — LIPID PANEL
CHOLESTEROL: 158 mg/dL (ref 0–200)
HDL: 55.6 mg/dL (ref >39.00)
LDL CALC: 89 mg/dL (ref 0–99)
NonHDL: 102.53
TRIGLYCERIDES: 67 mg/dL (ref 0.0–149.0)
Total CHOL/HDL Ratio: 3
VLDL: 13.4 mg/dL (ref 0.0–40.0)

## 2017-01-07 LAB — TSH: TSH: 0.7 u[IU]/mL (ref 0.35–4.50)

## 2017-01-07 MED ORDER — ASPIRIN 81 MG PO TBEC: 81.0000 mg | DELAYED_RELEASE_TABLET | Freq: Every day | ORAL | 12 refills | Status: AC

## 2017-01-07 MED ORDER — SIMVASTATIN 40 MG PO TABS: 40.0000 mg | ORAL_TABLET | Freq: Every day | ORAL | 2 refills | Status: DC

## 2017-01-07 MED ORDER — CYCLOBENZAPRINE HCL 5 MG PO TABS: 5.0000 mg | ORAL_TABLET | Freq: Three times a day (TID) | ORAL | 2 refills | Status: DC | PRN

## 2017-01-07 NOTE — Assessment & Plan Note (Signed)

## 2017-01-07 NOTE — Progress Notes (Signed)
Subjective:    Patient ID: [name], female    DOB: [birth year], 51 y.o.   MRN: [id number]  HPI  Here for wellness and f/u;  Overall doing ok;  Pt denies Chest pain, worsening SOB, DOE, wheezing, orthopnea, PND, worsening LE edema, palpitations, dizziness or syncope.  Pt denies neurological change such as new headache, facial or extremity weakness.  Pt denies polydipsia, polyuria, or low sugar symptoms. Pt states overall good compliance with treatment and medications, good tolerability, and has been trying to follow appropriate diet.  Pt denies worsening depressive symptoms, suicidal ideation or panic. No fever, night sweats, wt loss, loss of appetite, or other constitutional symptoms.  Pt states good ability with ADL's, has low fall risk, home safety reviewed and adequate, no other significant changes in hearing or vision, and active with exercise with 30 min cardio at the gym 4 days per wk. Very hard to lose wt.   Has seen Dr Daiva Huge GYN yearly for routine exam, s/p TAH, ovaries intact.  Per pt he suggested perimenopause as possibly related, and suggested some lab testing  Remains disabled for MS like symptoms, and sees neurology yearly with MRI's Past Medical History:  Diagnosis Date  . ANXIETY 03/27/2010  . BACK PAIN, CHRONIC 03/27/2010  . Classic migraine with aura 05/30/2015  . COMMON MIGRAINE 03/27/2010  . [place] DISEASE, CERVICAL 03/27/2010  . FATIGUE 03/27/2010  . GERD 03/27/2010  . Glaucoma    bilateral  . HYPERLIPIDEMIA 03/27/2010  . Multiple sclerosis ([place]) 10/23/2010   unconfirmed, but suspected  . Osteoporosis 09/24/2012  . Unspecified vitamin D deficiency 10/23/2010   Past Surgical History:  Procedure Laterality Date  . ABDOMINAL HYSTERECTOMY    . LEFT HEART CATHETERIZATION WITH CORONARY ANGIOGRAM N/A 03/31/2014   Procedure: LEFT HEART CATHETERIZATION WITH CORONARY ANGIOGRAM;  Surgeon: Pixie Casino, MD;  Location: [facility] CATH LAB;  Service: Cardiovascular;  Laterality: N/A;  . Neg  stress test  2007  . s/p eye surgury    . s/p knee surgury      reports that she has never smoked. She has never used smokeless tobacco. She reports that she does not drink alcohol or use drugs. family history includes Arthritis in her other; Cancer in her mother and other; Dementia in her other; Diabetes in her father and other; Heart disease in her father and other; Hyperlipidemia in her father and mother; Hypertension in her father; Lupus in her cousin and maternal aunt. Allergies  Allergen Reactions  . Augmentin [Amoxicillin-Pot Clavulanate] Rash and Other (See Comments)    Pt stated "throat felt funny"  . Crestor [Rosuvastatin Calcium] Other (See Comments)    Myalgias   . Lipitor [Atorvastatin] Other (See Comments)    myalgia  . Lovastatin Other (See Comments)    myalgias  . Statins Nausea And Vomiting  . Topamax Other (See Comments)    dizziness   Current Outpatient Prescriptions on File Prior to Visit  Medication Sig Dispense Refill  . albuterol (PROVENTIL HFA;VENTOLIN HFA) 108 (90 Base) MCG/ACT inhaler Inhale 2 puffs into the lungs every 6 (six) hours as needed for wheezing or shortness of breath. 1 Inhaler 0  . brimonidine-timolol (COMBIGAN) 0.2-0.5 % ophthalmic solution Place 1 drop into both eyes every 12 (twelve) hours.    . cholecalciferol (VITAMIN D) 1000 UNITS tablet Take 1,000 Units by mouth daily.    Marland Kitchen ibuprofen (ADVIL,MOTRIN) 200 MG tablet Take 400 mg by mouth every 6 (six) hours as needed for fever, headache or  mild pain.    Marland Kitchen ipratropium (ATROVENT) 0.03 % nasal spray Place 2 sprays into both nostrils 2 (two) times daily. Do not use for more than 5days. 30 mL 0   No current facility-administered medications on file prior to visit.     Review of Systems Constitutional: Negative for other unusual diaphoresis, sweats, appetite or weight changes HENT: Negative for other worsening hearing loss, ear pain, facial swelling, mouth sores or neck stiffness.   Eyes: Negative  for other worsening pain, redness or other visual disturbance.  Respiratory: Negative for other stridor or swelling Cardiovascular: Negative for other palpitations or other chest pain  Gastrointestinal: Negative for worsening diarrhea or loose stools, blood in stool, distention or other pain Genitourinary: Negative for hematuria, flank pain or other change in urine volume.  Musculoskeletal: Negative for myalgias or other joint swelling.  Skin: Negative for other color change, or other wound or worsening drainage.  Neurological: Negative for other syncope or numbness. Hematological: Negative for other adenopathy or swelling Psychiatric/Behavioral: Negative for hallucinations, other worsening agitation, SI, self-injury, or new decreased concentration All other system neg per pt    Objective:   Physical Exam BP 104/78   Pulse 78   Ht 5\' 4"  (1.626 m)   Wt 207 lb (93.9 kg)   SpO2 100%   BMI 35.53 kg/m  VS noted,  Constitutional: Pt is oriented to person, place, and time. Appears well-developed and well-nourished, in no significant distress and comfortable Head: Normocephalic and atraumatic  Eyes: Conjunctivae and EOM are normal. Pupils are equal, round, and reactive to light Right Ear: External ear normal without discharge Left Ear: External ear normal without discharge Nose: Nose without discharge or deformity Mouth/Throat: Oropharynx is without other ulcerations and moist  Neck: Normal range of motion. Neck supple. No JVD present. No tracheal deviation present or significant neck LA or mass Cardiovascular: Normal rate, regular rhythm, normal heart sounds and intact distal pulses.   Pulmonary/Chest: WOB normal and breath sounds without rales or wheezing  Abdominal: Soft. Bowel sounds are normal. NT. No HSM  Musculoskeletal: Normal range of motion. Exhibits no edema Lymphadenopathy: Has no other cervical adenopathy.  Neurological: Pt is alert and oriented to person, place, and time. Pt  has normal reflexes. No cranial nerve deficit. Motor grossly intact, Gait intact Skin: Skin is warm and dry. No rash noted or new ulcerations Psychiatric:  Has normal mood and affect. Behavior is normal without agitation No other exam findings  Lab Results  Component Value Date   WBC 7.3 12/17/2016   HGB 13.4 12/17/2016   HCT 40.2 12/17/2016   PLT 292.0 12/17/2016   GLUCOSE 79 12/17/2016   CHOL 148 11/14/2016   TRIG 69 11/14/2016   HDL 55 11/14/2016   LDLDIRECT 184.9 08/04/2012   LDLCALC 79 11/14/2016   ALT 21 12/17/2016   AST 23 12/17/2016   NA 138 12/17/2016   K 4.1 12/17/2016   CL 103 12/17/2016   CREATININE 0.57 12/17/2016   BUN 12 12/17/2016   CO2 29 12/17/2016   TSH 0.68 01/04/2016   INR 0.99 05/24/2015        Assessment & Plan:

## 2017-01-07 NOTE — Patient Instructions (Addendum)

## 2017-01-07 NOTE — Progress Notes (Signed)
Pre visit review using our clinic review tool, if applicable. No additional management support is needed unless otherwise documented below in the visit note. 

## 2017-01-07 NOTE — Assessment & Plan Note (Signed)
Unclear, for hormone evalutatoin today

## 2017-02-04 DIAGNOSIS — H4423 Degenerative myopia, bilateral: Secondary | ICD-10-CM | POA: Diagnosis not present

## 2017-02-04 DIAGNOSIS — H401131 Primary open-angle glaucoma, bilateral, mild stage: Secondary | ICD-10-CM | POA: Diagnosis not present

## 2017-02-10 ENCOUNTER — Encounter: Payer: Self-pay | Admitting: Internal Medicine

## 2017-02-16 DIAGNOSIS — H15842 Scleral ectasia, left eye: Secondary | ICD-10-CM | POA: Diagnosis not present

## 2017-02-16 DIAGNOSIS — H353121 Nonexudative age-related macular degeneration, left eye, early dry stage: Secondary | ICD-10-CM | POA: Diagnosis not present

## 2017-02-16 DIAGNOSIS — H4421 Degenerative myopia, right eye: Secondary | ICD-10-CM | POA: Diagnosis not present

## 2017-02-16 DIAGNOSIS — H33012 Retinal detachment with single break, left eye: Secondary | ICD-10-CM | POA: Diagnosis not present

## 2017-02-16 DIAGNOSIS — H43392 Other vitreous opacities, left eye: Secondary | ICD-10-CM | POA: Diagnosis not present

## 2017-02-16 DIAGNOSIS — H15841 Scleral ectasia, right eye: Secondary | ICD-10-CM | POA: Diagnosis not present

## 2017-02-17 DIAGNOSIS — H15832 Staphyloma posticum, left eye: Secondary | ICD-10-CM | POA: Diagnosis not present

## 2017-02-17 DIAGNOSIS — H33012 Retinal detachment with single break, left eye: Secondary | ICD-10-CM | POA: Diagnosis not present

## 2017-02-17 DIAGNOSIS — H3342 Traction detachment of retina, left eye: Secondary | ICD-10-CM | POA: Diagnosis not present

## 2017-02-17 DIAGNOSIS — H33022 Retinal detachment with multiple breaks, left eye: Secondary | ICD-10-CM | POA: Diagnosis not present

## 2017-02-17 DIAGNOSIS — H15842 Scleral ectasia, left eye: Secondary | ICD-10-CM | POA: Diagnosis not present

## 2017-02-26 DIAGNOSIS — T380X5A Adverse effect of glucocorticoids and synthetic analogues, initial encounter: Secondary | ICD-10-CM | POA: Diagnosis not present

## 2017-03-23 DIAGNOSIS — H3342 Traction detachment of retina, left eye: Secondary | ICD-10-CM | POA: Diagnosis not present

## 2017-03-23 DIAGNOSIS — H33022 Retinal detachment with multiple breaks, left eye: Secondary | ICD-10-CM | POA: Diagnosis not present

## 2017-03-23 DIAGNOSIS — H15842 Scleral ectasia, left eye: Secondary | ICD-10-CM | POA: Diagnosis not present

## 2017-03-25 DIAGNOSIS — H3342 Traction detachment of retina, left eye: Secondary | ICD-10-CM | POA: Diagnosis not present

## 2017-03-25 DIAGNOSIS — H33022 Retinal detachment with multiple breaks, left eye: Secondary | ICD-10-CM | POA: Diagnosis not present

## 2017-03-31 DIAGNOSIS — T380X5A Adverse effect of glucocorticoids and synthetic analogues, initial encounter: Secondary | ICD-10-CM | POA: Diagnosis not present

## 2017-04-09 ENCOUNTER — Encounter: Payer: BLUE CROSS/BLUE SHIELD | Admitting: Internal Medicine

## 2017-05-26 ENCOUNTER — Other Ambulatory Visit: Payer: Self-pay | Admitting: Internal Medicine

## 2017-05-28 ENCOUNTER — Encounter: Payer: BLUE CROSS/BLUE SHIELD | Admitting: Internal Medicine

## 2017-06-30 DIAGNOSIS — H353121 Nonexudative age-related macular degeneration, left eye, early dry stage: Secondary | ICD-10-CM | POA: Diagnosis not present

## 2017-06-30 DIAGNOSIS — H15842 Scleral ectasia, left eye: Secondary | ICD-10-CM | POA: Diagnosis not present

## 2017-06-30 DIAGNOSIS — H2012 Chronic iridocyclitis, left eye: Secondary | ICD-10-CM | POA: Diagnosis not present

## 2017-06-30 DIAGNOSIS — H33022 Retinal detachment with multiple breaks, left eye: Secondary | ICD-10-CM | POA: Diagnosis not present

## 2017-06-30 DIAGNOSIS — T380X5A Adverse effect of glucocorticoids and synthetic analogues, initial encounter: Secondary | ICD-10-CM | POA: Diagnosis not present

## 2017-06-30 DIAGNOSIS — H3342 Traction detachment of retina, left eye: Secondary | ICD-10-CM | POA: Diagnosis not present

## 2017-08-03 ENCOUNTER — Telehealth: Payer: Self-pay | Admitting: Neurology

## 2017-08-03 ENCOUNTER — Ambulatory Visit: Payer: BLUE CROSS/BLUE SHIELD | Admitting: Neurology

## 2017-08-03 NOTE — Telephone Encounter (Signed)
This patient did not show for a revisit appointment today. 

## 2017-08-04 ENCOUNTER — Encounter: Payer: Self-pay | Admitting: Neurology

## 2017-09-22 DIAGNOSIS — H33012 Retinal detachment with single break, left eye: Secondary | ICD-10-CM | POA: Diagnosis not present

## 2017-09-22 DIAGNOSIS — H15842 Scleral ectasia, left eye: Secondary | ICD-10-CM | POA: Diagnosis not present

## 2017-09-22 DIAGNOSIS — H2012 Chronic iridocyclitis, left eye: Secondary | ICD-10-CM | POA: Diagnosis not present

## 2017-09-22 DIAGNOSIS — H33022 Retinal detachment with multiple breaks, left eye: Secondary | ICD-10-CM | POA: Diagnosis not present

## 2017-09-22 DIAGNOSIS — H15841 Scleral ectasia, right eye: Secondary | ICD-10-CM | POA: Diagnosis not present

## 2017-09-22 DIAGNOSIS — T380X5A Adverse effect of glucocorticoids and synthetic analogues, initial encounter: Secondary | ICD-10-CM | POA: Diagnosis not present

## 2017-09-22 DIAGNOSIS — H353121 Nonexudative age-related macular degeneration, left eye, early dry stage: Secondary | ICD-10-CM | POA: Diagnosis not present

## 2017-10-16 DIAGNOSIS — Z803 Family history of malignant neoplasm of breast: Secondary | ICD-10-CM | POA: Diagnosis not present

## 2017-10-16 DIAGNOSIS — Z1231 Encounter for screening mammogram for malignant neoplasm of breast: Secondary | ICD-10-CM | POA: Diagnosis not present

## 2017-12-04 DIAGNOSIS — Z130 Encounter for screening for diseases of the blood and blood-forming organs and certain disorders involving the immune mechanism: Secondary | ICD-10-CM | POA: Diagnosis not present

## 2017-12-04 DIAGNOSIS — Z1211 Encounter for screening for malignant neoplasm of colon: Secondary | ICD-10-CM | POA: Diagnosis not present

## 2017-12-04 DIAGNOSIS — Z1389 Encounter for screening for other disorder: Secondary | ICD-10-CM | POA: Diagnosis not present

## 2017-12-04 DIAGNOSIS — Z01419 Encounter for gynecological examination (general) (routine) without abnormal findings: Secondary | ICD-10-CM | POA: Diagnosis not present

## 2017-12-23 DIAGNOSIS — H15841 Scleral ectasia, right eye: Secondary | ICD-10-CM | POA: Diagnosis not present

## 2017-12-23 DIAGNOSIS — H33012 Retinal detachment with single break, left eye: Secondary | ICD-10-CM | POA: Diagnosis not present

## 2017-12-23 DIAGNOSIS — H33022 Retinal detachment with multiple breaks, left eye: Secondary | ICD-10-CM | POA: Diagnosis not present

## 2017-12-23 DIAGNOSIS — H3342 Traction detachment of retina, left eye: Secondary | ICD-10-CM | POA: Diagnosis not present

## 2017-12-23 DIAGNOSIS — H4421 Degenerative myopia, right eye: Secondary | ICD-10-CM | POA: Diagnosis not present

## 2017-12-27 DIAGNOSIS — Z1211 Encounter for screening for malignant neoplasm of colon: Secondary | ICD-10-CM | POA: Diagnosis not present

## 2018-01-12 ENCOUNTER — Other Ambulatory Visit (INDEPENDENT_AMBULATORY_CARE_PROVIDER_SITE_OTHER): Payer: PRIVATE HEALTH INSURANCE

## 2018-01-12 ENCOUNTER — Ambulatory Visit (INDEPENDENT_AMBULATORY_CARE_PROVIDER_SITE_OTHER): Payer: PRIVATE HEALTH INSURANCE | Admitting: Internal Medicine

## 2018-01-12 ENCOUNTER — Encounter: Payer: Self-pay | Admitting: Internal Medicine

## 2018-01-12 VITALS — BP 114/78 | HR 83 | Temp 98.1°F | Ht 64.0 in | Wt 192.0 lb

## 2018-01-12 DIAGNOSIS — Z0000 Encounter for general adult medical examination without abnormal findings: Secondary | ICD-10-CM | POA: Diagnosis not present

## 2018-01-12 LAB — URINALYSIS, ROUTINE W REFLEX MICROSCOPIC
Hgb urine dipstick: NEGATIVE
KETONES UR: 40 — AB
LEUKOCYTES UA: NEGATIVE
Nitrite: NEGATIVE
Total Protein, Urine: NEGATIVE
URINE GLUCOSE: NEGATIVE
UROBILINOGEN UA: 0.2 (ref 0.0–1.0)
pH: 6 (ref 5.0–8.0)

## 2018-01-12 LAB — BASIC METABOLIC PANEL
BUN: 12 mg/dL (ref 6–23)
CALCIUM: 9.5 mg/dL (ref 8.4–10.5)
CO2: 27 meq/L (ref 19–32)
CREATININE: 0.63 mg/dL (ref 0.40–1.20)
Chloride: 103 mEq/L (ref 96–112)
GFR: 127.38 mL/min (ref >60.00)
GLUCOSE: 79 mg/dL (ref 70–99)
Potassium: 4 mEq/L (ref 3.5–5.1)
Sodium: 138 mEq/L (ref 135–145)

## 2018-01-12 LAB — HEPATIC FUNCTION PANEL
ALK PHOS: 83 U/L (ref 39–117)
ALT: 25 U/L (ref 0–35)
AST: 28 U/L (ref 0–37)
Albumin: 4.2 g/dL (ref 3.5–5.2)
BILIRUBIN TOTAL: 0.5 mg/dL (ref 0.2–1.2)
Bilirubin, Direct: 0.1 mg/dL (ref 0.0–0.3)
Total Protein: 7.8 g/dL (ref 6.0–8.3)

## 2018-01-12 LAB — LIPID PANEL
CHOL/HDL RATIO: 4
Cholesterol: 222 mg/dL — ABNORMAL HIGH (ref 0–200)
HDL: 58.2 mg/dL (ref >39.00)
LDL CALC: 149 mg/dL — AB (ref 0–99)
NONHDL: 163.89
Triglycerides: 74 mg/dL (ref 0.0–149.0)
VLDL: 14.8 mg/dL (ref 0.0–40.0)

## 2018-01-12 LAB — CBC WITH DIFFERENTIAL/PLATELET
Basophils Absolute: 0 10*3/uL (ref 0.0–0.1)
Basophils Relative: 1.1 % (ref 0.0–3.0)
EOS ABS: 0.1 10*3/uL (ref 0.0–0.7)
Eosinophils Relative: 1.3 % (ref 0.0–5.0)
HCT: 42.2 % (ref 36.0–46.0)
Hemoglobin: 14.5 g/dL (ref 12.0–15.0)
LYMPHS ABS: 1.8 10*3/uL (ref 0.7–4.0)
Lymphocytes Relative: 43.2 % (ref 12.0–46.0)
MCHC: 34.3 g/dL (ref 30.0–36.0)
MCV: 93.2 fl (ref 78.0–100.0)
MONO ABS: 0.3 10*3/uL (ref 0.1–1.0)
MONOS PCT: 6.5 % (ref 3.0–12.0)
NEUTROS PCT: 47.9 % (ref 43.0–77.0)
Neutro Abs: 2 10*3/uL (ref 1.4–7.7)
Platelets: 287 10*3/uL (ref 150.0–400.0)
RBC: 4.53 Mil/uL (ref 3.87–5.11)
RDW: 13.5 % (ref 11.5–15.5)
WBC: 4.2 10*3/uL (ref 4.0–10.5)

## 2018-01-12 LAB — TSH: TSH: 0.82 u[IU]/mL (ref 0.35–4.50)

## 2018-01-12 MED ORDER — SIMVASTATIN 40 MG PO TABS: 40.0000 mg | ORAL_TABLET | Freq: Every day | ORAL | 11 refills | Status: DC

## 2018-01-12 NOTE — Assessment & Plan Note (Signed)

## 2018-01-12 NOTE — Patient Instructions (Signed)

## 2018-01-12 NOTE — Progress Notes (Signed)
Subjective:    Patient ID: [name], female    DOB: [birth year], 52 y.o.   MRN: [id number]  HPI  Here for wellness and f/u;  Overall doing ok;  Pt denies Chest pain, worsening SOB, DOE, wheezing, orthopnea, PND, worsening LE edema, palpitations, dizziness or syncope.  Pt denies neurological change such as new headache, facial or extremity weakness.  Pt denies polydipsia, polyuria, or low sugar symptoms. Pt states overall good compliance with treatment and medications, good tolerability, and has been trying to follow appropriate diet.  Pt denies worsening depressive symptoms, suicidal ideation or panic. No fever, night sweats, wt loss, loss of appetite, or other constitutional symptoms.  Pt states good ability with ADL's, has low fall risk, home safety reviewed and adequate, no other significant changes in hearing or vision, and only occasionally active with exercise.  Had colonoscopy set for last yr, but had to cancel due to eye surgury x 2.  Did have FOBT per GYn but not sure of results.  Due for 1 more eye surgury, then consider colonsocopy later Past Medical History:  Diagnosis Date  . ANXIETY 03/27/2010  . BACK PAIN, CHRONIC 03/27/2010  . Classic migraine with aura 05/30/2015  . COMMON MIGRAINE 03/27/2010  . [place] DISEASE, CERVICAL 03/27/2010  . FATIGUE 03/27/2010  . GERD 03/27/2010  . Glaucoma    bilateral  . HYPERLIPIDEMIA 03/27/2010  . Multiple sclerosis ([place]) 10/23/2010   unconfirmed, but suspected  . Osteoporosis 09/24/2012  . Unspecified vitamin D deficiency 10/23/2010   Past Surgical History:  Procedure Laterality Date  . ABDOMINAL HYSTERECTOMY    . LEFT HEART CATHETERIZATION WITH CORONARY ANGIOGRAM N/A 03/31/2014   Procedure: LEFT HEART CATHETERIZATION WITH CORONARY ANGIOGRAM;  Surgeon: Pixie Casino, MD;  Location: [facility] CATH LAB;  Service: Cardiovascular;  Laterality: N/A;  . Neg stress test  2007  . s/p eye surgury    . s/p knee surgury      reports that she has never smoked.  She has never used smokeless tobacco. She reports that she does not drink alcohol or use drugs. family history includes Arthritis in her other; Cancer in her mother and other; Dementia in her other; Diabetes in her father and other; Heart disease in her father and other; Hyperlipidemia in her father and mother; Hypertension in her father; Lupus in her cousin and maternal aunt. Allergies  Allergen Reactions  . Augmentin [Amoxicillin-Pot Clavulanate] Rash and Other (See Comments)    Pt stated "throat felt funny"  . Crestor [Rosuvastatin Calcium] Other (See Comments)    Myalgias   . Lipitor [Atorvastatin] Other (See Comments)    myalgia  . Lovastatin Other (See Comments)    myalgias  . Statins Nausea And Vomiting  . Topamax Other (See Comments)    dizziness   Current Outpatient Medications on File Prior to Visit  Medication Sig Dispense Refill  . albuterol (PROVENTIL HFA;VENTOLIN HFA) 108 (90 Base) MCG/ACT inhaler Inhale 2 puffs into the lungs every 6 (six) hours as needed for wheezing or shortness of breath. 1 Inhaler 0  . aspirin 81 MG EC tablet Take 1 tablet (81 mg total) by mouth daily. Swallow whole. 30 tablet 12  . brimonidine-timolol (COMBIGAN) 0.2-0.5 % ophthalmic solution Place 1 drop into both eyes every 12 (twelve) hours.    . cholecalciferol (VITAMIN D) 1000 UNITS tablet Take 1,000 Units by mouth daily.    . cyclobenzaprine (FLEXERIL) 5 MG tablet Take 1 tablet (5 mg total) by mouth 3 (three) times  daily as needed for muscle spasms. 60 tablet 2  . ibuprofen (ADVIL,MOTRIN) 200 MG tablet Take 400 mg by mouth every 6 (six) hours as needed for fever, headache or mild pain.    Marland Kitchen ipratropium (ATROVENT) 0.03 % nasal spray Place 2 sprays into both nostrils 2 (two) times daily. Do not use for more than 5days. 30 mL 0   No current facility-administered medications on file prior to visit.    Review of Systems Constitutional: Negative for other unusual diaphoresis, sweats, appetite or weight  changes HENT: Negative for other worsening hearing loss, ear pain, facial swelling, mouth sores or neck stiffness.   Eyes: Negative for other worsening pain, redness or other visual disturbance.  Respiratory: Negative for other stridor or swelling Cardiovascular: Negative for other palpitations or other chest pain  Gastrointestinal: Negative for worsening diarrhea or loose stools, blood in stool, distention or other pain Genitourinary: Negative for hematuria, flank pain or other change in urine volume.  Musculoskeletal: Negative for myalgias or other joint swelling.  Skin: Negative for other color change, or other wound or worsening drainage.  Neurological: Negative for other syncope or numbness. Hematological: Negative for other adenopathy or swelling Psychiatric/Behavioral: Negative for hallucinations, other worsening agitation, SI, self-injury, or new decreased concentration All other system neg per pt    Objective:   Physical Exam BP 114/78   Pulse 83   Temp 98.1 F (36.7 C) (Oral)   Ht 5\' 4"  (1.626 m)   Wt 192 lb (87.1 kg)   SpO2 98%   BMI 32.96 kg/m  VS noted,  Constitutional: Pt is oriented to person, place, and time. Appears well-developed and well-nourished, in no significant distress and comfortable Head: Normocephalic and atraumatic  Eyes: Conjunctivae and EOM are normal. Pupils are equal, round, and reactive to light Right Ear: External ear normal without discharge Left Ear: External ear normal without discharge Nose: Nose without discharge or deformity Mouth/Throat: Oropharynx is without other ulcerations and moist  Neck: Normal range of motion. Neck supple. No JVD present. No tracheal deviation present or significant neck LA or mass Cardiovascular: Normal rate, regular rhythm, normal heart sounds and intact distal pulses.   Pulmonary/Chest: WOB normal and breath sounds without rales or wheezing  Abdominal: Soft. Bowel sounds are normal. NT. No HSM  Musculoskeletal:  Normal range of motion. Exhibits no edema Lymphadenopathy: Has no other cervical adenopathy.  Neurological: Pt is alert and oriented to person, place, and time. Pt has normal reflexes. No cranial nerve deficit. Motor grossly intact, Gait intact Skin: Skin is warm and dry. No rash noted or new ulcerations Psychiatric:  Has normal mood and affect. Behavior is normal without agitation No other  Exam findings Lab Results  Component Value Date   WBC 5.3 01/07/2017   HGB 13.7 01/07/2017   HCT 41.0 01/07/2017   PLT 251.0 01/07/2017   GLUCOSE 91 01/07/2017   CHOL 158 01/07/2017   TRIG 67.0 01/07/2017   HDL 55.60 01/07/2017   LDLDIRECT 184.9 08/04/2012   LDLCALC 89 01/07/2017   ALT 12 01/07/2017   AST 18 01/07/2017   NA 137 01/07/2017   K 4.3 01/07/2017   CL 105 01/07/2017   CREATININE 0.64 01/07/2017   BUN 10 01/07/2017   CO2 28 01/07/2017   TSH 0.70 01/07/2017   INR 0.99 05/24/2015       Assessment & Plan:

## 2018-04-12 DIAGNOSIS — H33012 Retinal detachment with single break, left eye: Secondary | ICD-10-CM | POA: Diagnosis not present

## 2018-04-12 DIAGNOSIS — H4421 Degenerative myopia, right eye: Secondary | ICD-10-CM | POA: Diagnosis not present

## 2018-04-12 DIAGNOSIS — H15841 Scleral ectasia, right eye: Secondary | ICD-10-CM | POA: Diagnosis not present

## 2018-04-12 DIAGNOSIS — H3342 Traction detachment of retina, left eye: Secondary | ICD-10-CM | POA: Diagnosis not present

## 2018-04-12 DIAGNOSIS — H353121 Nonexudative age-related macular degeneration, left eye, early dry stage: Secondary | ICD-10-CM | POA: Diagnosis not present

## 2018-04-12 DIAGNOSIS — H33022 Retinal detachment with multiple breaks, left eye: Secondary | ICD-10-CM | POA: Diagnosis not present

## 2018-04-12 DIAGNOSIS — H15842 Scleral ectasia, left eye: Secondary | ICD-10-CM | POA: Diagnosis not present

## 2018-09-14 DIAGNOSIS — H15841 Scleral ectasia, right eye: Secondary | ICD-10-CM | POA: Diagnosis not present

## 2018-09-14 DIAGNOSIS — H3342 Traction detachment of retina, left eye: Secondary | ICD-10-CM | POA: Diagnosis not present

## 2018-09-14 DIAGNOSIS — H15842 Scleral ectasia, left eye: Secondary | ICD-10-CM | POA: Diagnosis not present

## 2018-09-14 DIAGNOSIS — H2012 Chronic iridocyclitis, left eye: Secondary | ICD-10-CM | POA: Diagnosis not present

## 2018-09-14 DIAGNOSIS — Z8669 Personal history of other diseases of the nervous system and sense organs: Secondary | ICD-10-CM | POA: Diagnosis not present

## 2018-09-14 DIAGNOSIS — H33022 Retinal detachment with multiple breaks, left eye: Secondary | ICD-10-CM | POA: Diagnosis not present

## 2018-10-10 ENCOUNTER — Other Ambulatory Visit: Payer: Self-pay

## 2018-10-10 ENCOUNTER — Encounter (HOSPITAL_COMMUNITY): Payer: Self-pay

## 2018-10-10 ENCOUNTER — Ambulatory Visit (HOSPITAL_COMMUNITY)
Admission: EM | Admit: 2018-10-10 | Discharge: 2018-10-10 | Disposition: A | Discharge status: Home / Self Care | Payer: PRIVATE HEALTH INSURANCE | Attending: Family Medicine | Admitting: Family Medicine

## 2018-10-10 DIAGNOSIS — H6502 Acute serous otitis media, left ear: Secondary | ICD-10-CM | POA: Diagnosis not present

## 2018-10-10 MED ORDER — FLUTICASONE PROPIONATE 50 MCG/ACT NA SUSP: 2.0000 | Freq: Every day | NASAL | 2 refills | Status: DC

## 2018-10-10 MED ORDER — BENZONATATE 200 MG PO CAPS: 200.0000 mg | ORAL_CAPSULE | Freq: Two times a day (BID) | ORAL | 0 refills | Status: DC | PRN

## 2018-10-10 MED ORDER — AMOXICILLIN-POT CLAVULANATE 875-125 MG PO TABS: 1.0000 | ORAL_TABLET | Freq: Two times a day (BID) | ORAL | 0 refills | Status: DC

## 2018-10-10 NOTE — ED Provider Notes (Signed)
[facility]    CSN: [id number] Arrival date & time: 10/10/18  1527     History   Chief Complaint Chief Complaint  Patient presents with  . Otalgia    HPI [name] is a 53 y.o. female.   HPI  Patient is here for an upper respiratory infection.  She has had cough cold runny nose and sore throat for a week.  She has been taking over-the-counter medications.  Since yesterday she has had left ear discomfort.  It is getting worse.  Today she has dizziness.  When she turns her head she feels a spinning sensation.  She is feeling more tired.  She states her husband is sick as well.  No known exposure to strep, pneumonia, or influenza  Past Medical History:  Diagnosis Date  . ANXIETY 03/27/2010  . BACK PAIN, CHRONIC 03/27/2010  . Classic migraine with aura 05/30/2015  . COMMON MIGRAINE 03/27/2010  . [place] DISEASE, CERVICAL 03/27/2010  . FATIGUE 03/27/2010  . GERD 03/27/2010  . Glaucoma    bilateral  . HYPERLIPIDEMIA 03/27/2010  . Multiple sclerosis ([place]) 10/23/2010   unconfirmed, but suspected  . Osteoporosis 09/24/2012  . Unspecified vitamin D deficiency 10/23/2010    Patient Active Problem List   Diagnosis Date Noted  . Perimenopause 01/07/2017  . Right leg pain 12/31/2016  . Cervicalgia 11/13/2016  . Familial hypercholesterolemia 01/22/2016  . Coronary artery calcification seen on CT scan 01/04/2016  . Classic migraine with aura 05/30/2015  . Paresthesia 12/07/2014  . Chest pain 03/28/2014  . Palpitations 03/28/2014  . Myalgia 12/14/2013  . Osteoporosis 09/24/2012  . Encounter for long-term (current) use of high-risk medication 05/26/2011  . Preventative health care 04/23/2011  . Allergic rhinitis 12/10/2010  . Unspecified vitamin D deficiency 10/23/2010  . Multiple sclerosis ([place]) 10/23/2010  . Hyperlipidemia 03/27/2010  . ANXIETY 03/27/2010  . Migraine without aura 03/27/2010  . GERD 03/27/2010  . [place] DISEASE, CERVICAL 03/27/2010  . BACK PAIN, CHRONIC  03/27/2010    Past Surgical History:  Procedure Laterality Date  . ABDOMINAL HYSTERECTOMY    . LEFT HEART CATHETERIZATION WITH CORONARY ANGIOGRAM N/A 03/31/2014   Procedure: LEFT HEART CATHETERIZATION WITH CORONARY ANGIOGRAM;  Surgeon: Pixie Casino, MD;  Location: [facility] CATH LAB;  Service: Cardiovascular;  Laterality: N/A;  . Neg stress test  2007  . s/p eye surgury    . s/p knee surgury      OB History   No obstetric history on file.      Home Medications    Prior to Admission medications   Medication Sig Start Date End Date Taking? Authorizing Provider  albuterol (PROVENTIL HFA;VENTOLIN HFA) 108 (90 Base) MCG/ACT inhaler Inhale 2 puffs into the lungs every 6 (six) hours as needed for wheezing or shortness of breath. 10/21/16   Nche, Charlene Brooke, NP  amoxicillin-clavulanate (AUGMENTIN) 875-125 MG tablet Take 1 tablet by mouth every 12 (twelve) hours. 10/10/18   Raylene Everts, MD  aspirin 81 MG EC tablet Take 1 tablet (81 mg total) by mouth daily. Swallow whole. 01/07/17   Biagio Borg, MD  benzonatate (TESSALON) 200 MG capsule Take 1 capsule (200 mg total) by mouth 2 (two) times daily as needed for cough. 10/10/18   Raylene Everts, MD  brimonidine-timolol (COMBIGAN) 0.2-0.5 % ophthalmic solution Place 1 drop into both eyes every 12 (twelve) hours.    [provider]  cholecalciferol (VITAMIN D) 1000 UNITS tablet Take 1,000 Units by mouth daily.  [provider]  cyclobenzaprine (FLEXERIL) 5 MG tablet Take 1 tablet (5 mg total) by mouth 3 (three) times daily as needed for muscle spasms. 01/07/17   Biagio Borg, MD  fluticasone (FLONASE) 50 MCG/ACT nasal spray Place 2 sprays into both nostrils daily. 10/10/18   Raylene Everts, MD  ibuprofen (ADVIL,MOTRIN) 200 MG tablet Take 400 mg by mouth every 6 (six) hours as needed for fever, headache or mild pain.    [provider]  ipratropium (ATROVENT) 0.03 % nasal spray Place 2 sprays into both nostrils 2  (two) times daily. Do not use for more than 5days. 10/21/16   Nche, Charlene Brooke, NP  simvastatin (ZOCOR) 40 MG tablet Take 1 tablet (40 mg total) by mouth daily. 01/12/18   Biagio Borg, MD    Family History Family History  Problem Relation Age of Onset  . Heart disease Father   . Diabetes Father   . Hyperlipidemia Father   . Hypertension Father   . Cancer Mother        breast  . Hyperlipidemia Mother   . Diabetes Other   . Arthritis Other   . Dementia Other   . Cancer Other        2 uncles with lung cancer  . Heart disease Other        2 uncles with heart disease  . Lupus Maternal Aunt   . Lupus Cousin        mother and father's side    Social History Social History   Tobacco Use  . Smoking status: Never Smoker  . Smokeless tobacco: Never Used  Substance Use Topics  . Alcohol use: No    Alcohol/week: 0.0 standard drinks  . Drug use: No     Allergies   Crestor [rosuvastatin calcium]; Lipitor [atorvastatin]; Lovastatin; Statins; and Topamax   Review of Systems Review of Systems  Constitutional: Negative for chills and fever.  HENT: Positive for congestion, ear pain, postnasal drip, rhinorrhea and sinus pain. Negative for sore throat.   Eyes: Negative for pain and visual disturbance.  Respiratory: Negative for cough and shortness of breath.   Cardiovascular: Negative for chest pain and palpitations.  Gastrointestinal: Negative for abdominal pain and vomiting.  Genitourinary: Negative for dysuria and hematuria.  Musculoskeletal: Negative for arthralgias and back pain.  Skin: Negative for color change and rash.  Neurological: Positive for dizziness. Negative for seizures and syncope.  All other systems reviewed and are negative.    Physical Exam Triage Vital Signs ED Triage Vitals  Enc Vitals Group     BP 10/10/18 1605 117/66     Pulse Rate 10/10/18 1605 67     Resp 10/10/18 1605 18     Temp 10/10/18 1605 98.5 F (36.9 C)     Temp src --      SpO2  10/10/18 1605 100 %     Weight 10/10/18 1606 175 lb (79.4 kg)     Height --      Head Circumference --      Peak Flow --      Pain Score 10/10/18 1606 6     Pain Loc --      Pain Edu? --      Excl. in [place]? --    No data found.  Updated Vital Signs BP 117/66   Pulse 67   Temp 98.5 F (36.9 C)   Resp 18   Wt 79.4 kg   SpO2 100%   BMI  30.04 kg/m       Physical Exam Constitutional:      General: She is not in acute distress.    Appearance: She is well-developed and normal weight.  HENT:     Head: Normocephalic and atraumatic.     Right Ear: Tympanic membrane, ear canal and external ear normal.     Left Ear: Ear canal and external ear normal.     Ears:     Comments: Left TM is bulging, dull, slight erythema    Nose: Congestion and rhinorrhea present.  Eyes:     Conjunctiva/sclera: Conjunctivae normal.     Pupils: Pupils are equal, round, and reactive to light.  Neck:     Musculoskeletal: Normal range of motion and neck supple.  Cardiovascular:     Rate and Rhythm: Normal rate and regular rhythm.  Pulmonary:     Effort: Pulmonary effort is normal. No respiratory distress.     Breath sounds: Rhonchi present.  Abdominal:     General: There is no distension.     Palpations: Abdomen is soft.  Musculoskeletal: Normal range of motion.  Lymphadenopathy:     Cervical: No cervical adenopathy.  Skin:    General: Skin is warm and dry.  Neurological:     General: No focal deficit present.     Mental Status: She is alert. Mental status is at baseline.      UC Treatments / Results  Labs (all labs ordered are listed, but only abnormal results are displayed) Labs Reviewed - No data to display  EKG None  Radiology No results found.  Procedures Procedures (including critical care time)  Medications Ordered in UC Medications - No data to display  Initial Impression / Assessment and Plan / UC Course  I have reviewed the triage vital signs and the nursing  notes.  Pertinent labs & imaging results that were available during my care of the patient were reviewed by me and considered in my medical decision making (see chart for details).     This is likely a viral illness.  Since she is not getting better after a week of treatment, and is developed some ear pain and dullness I think an antibiotic is appropriate.Dizziness is not surprising with ear findings. Final Clinical Impressions(s) / UC Diagnoses   Final diagnoses:  Non-recurrent acute serous otitis media of left ear     Discharge Instructions     Take the antibiotic 2 times a day Drink plenty of fluids Take Tylenol or ibuprofen for pain Use Flonase as directed.  This will help with sinus congestion and ear pain Take Tessalon twice a day for cough Expect improvement over the next couple of days  follow-up with your PCP if you fail to improve   ED Prescriptions    Medication Sig Dispense Auth. Provider   amoxicillin-clavulanate (AUGMENTIN) 875-125 MG tablet Take 1 tablet by mouth every 12 (twelve) hours. 14 tablet Raylene Everts, MD   benzonatate (TESSALON) 200 MG capsule Take 1 capsule (200 mg total) by mouth 2 (two) times daily as needed for cough. 20 capsule Raylene Everts, MD   fluticasone [facility]) 50 MCG/ACT nasal spray Place 2 sprays into both nostrils daily. 16 g Raylene Everts, MD     Controlled Substance Prescriptions [place] Controlled Substance Registry consulted? Not Applicable   Raylene Everts, MD 10/10/18 1859

## 2018-10-10 NOTE — Discharge Instructions (Signed)
Take the antibiotic 2 times a day Drink plenty of fluids Take Tylenol or ibuprofen for pain Use Flonase as directed.  This will help with sinus congestion and ear pain Take Tessalon twice a day for cough Expect improvement over the next couple of days  follow-up with your PCP if you fail to improve

## 2018-10-10 NOTE — ED Triage Notes (Signed)
Pt cc left ear discomfort and cough. Pt states she has been getting dizzy, headaches. X 1 week

## 2018-10-22 IMAGING — CT CT ABD-PELV W/ CM
2 of 5 series · 16 of 46 positions shown, 18 images · IV contrast (ISOVUE 300)
Comparison: November 16, 2008

CLINICAL DATA: Right lower quadrant pain and tenderness for 2
months. Chronic constipation

EXAM:
CT ABDOMEN AND PELVIS WITH CONTRAST
TECHNIQUE: Multidetector CT imaging of the abdomen and pelvis was performed
using the standard protocol following bolus administration of
intravenous contrast. Oral contrast was also administered.
CONTRAST:  100 mL Hsovue-BYY nonionic

[Series 2: abd/pel w · axial · 0.78mm/px · z∈[-454,-34]mm · 13 of 94 slices shown, 15 images]
[im 5/94  soft-tissue]
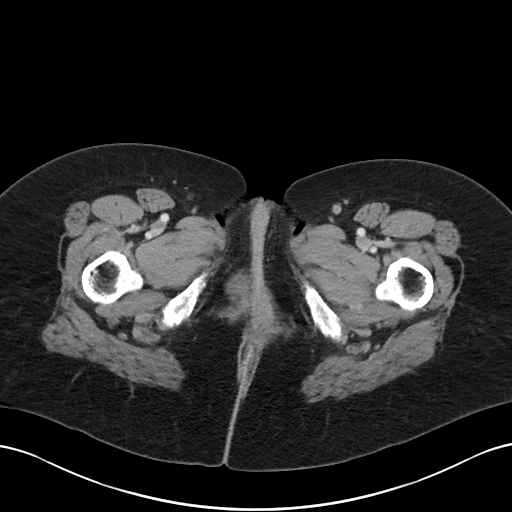
[im 5/94  bone]
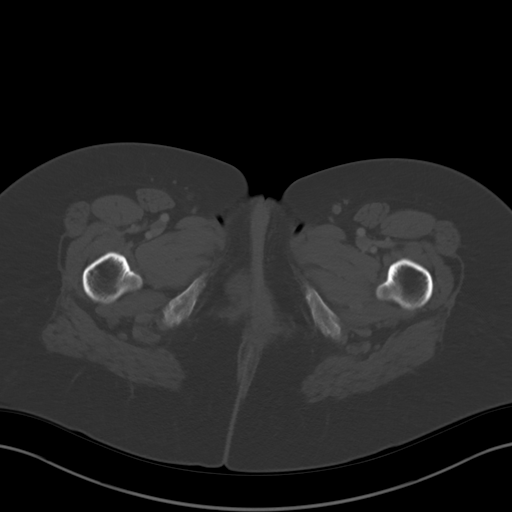
[im 15/94  soft-tissue]
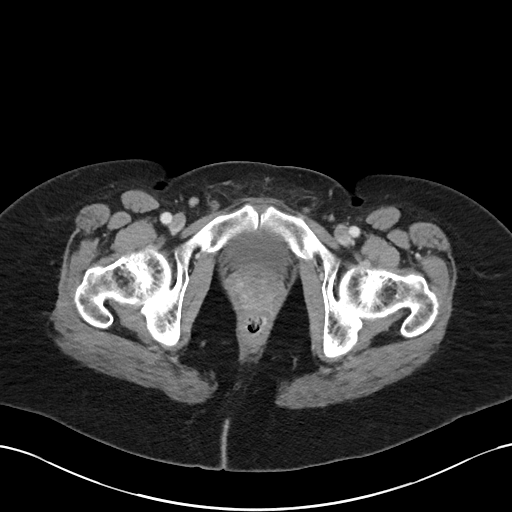
[im 20/94  soft-tissue]
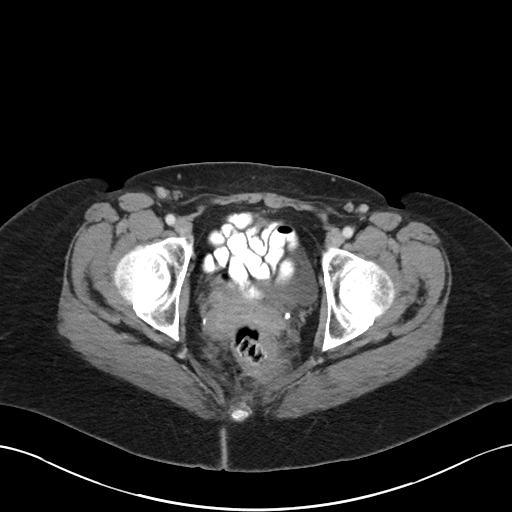
[im 25/94  soft-tissue]
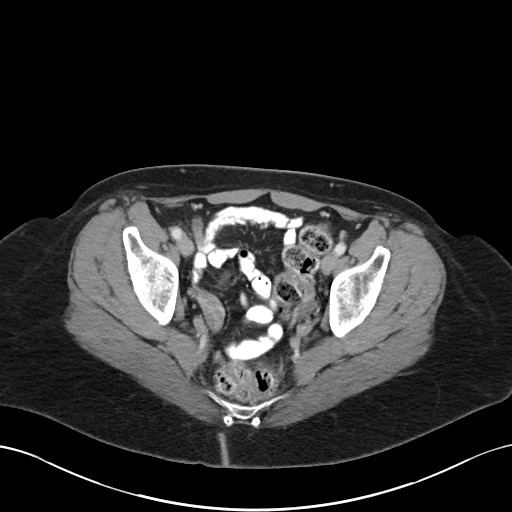
[im 35/94  soft-tissue]
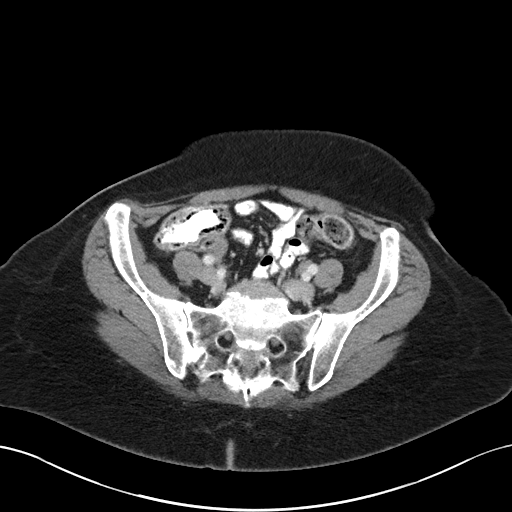
[im 40/94  soft-tissue]
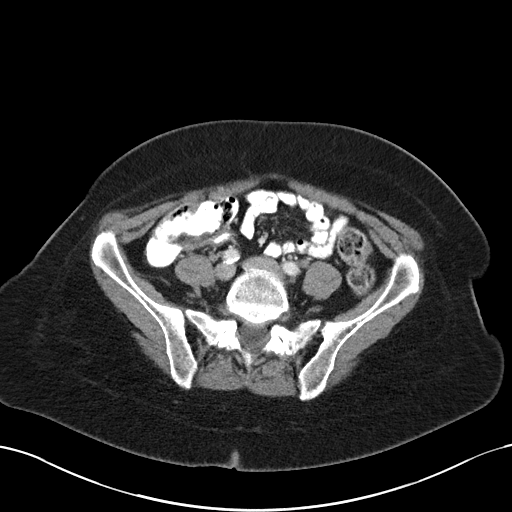
[im 49/94  soft-tissue]
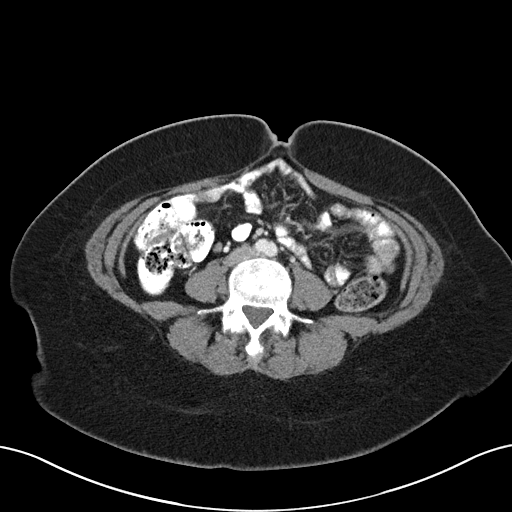
[im 54/94  soft-tissue]
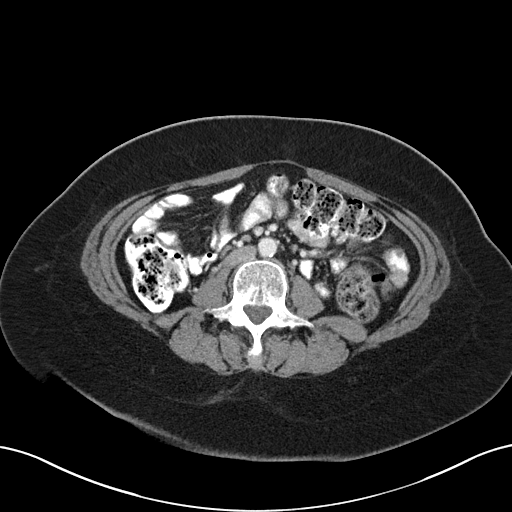
[im 59/94  soft-tissue]
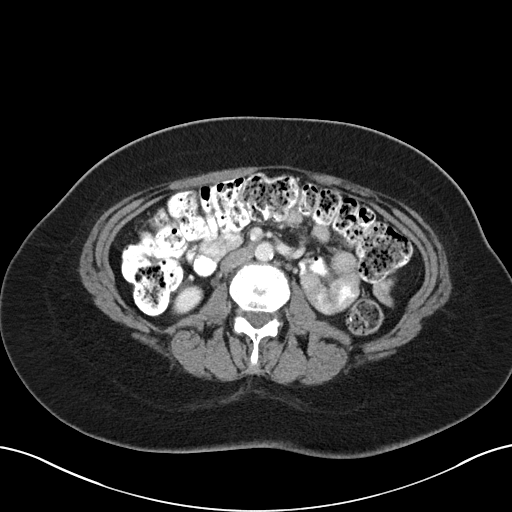
[im 59/94  bone]
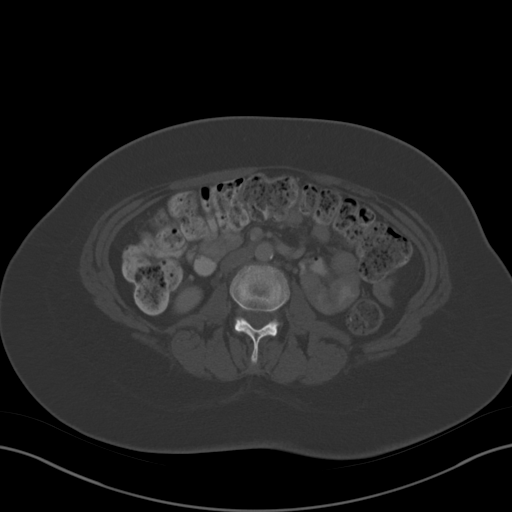
[im 69/94  soft-tissue]
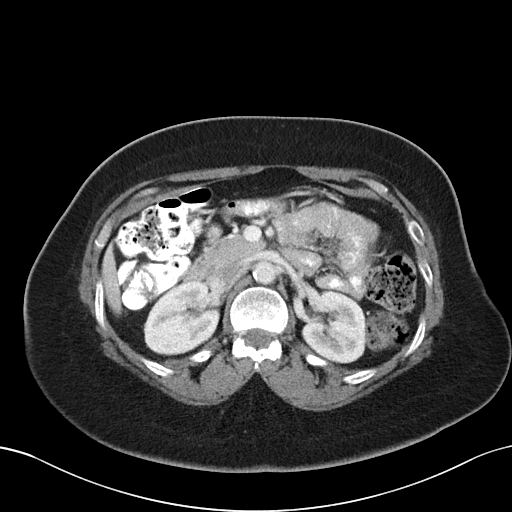
[im 74/94  soft-tissue]
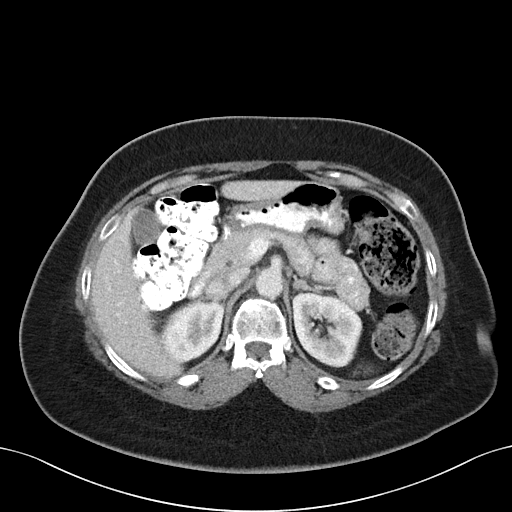
[im 79/94  soft-tissue]
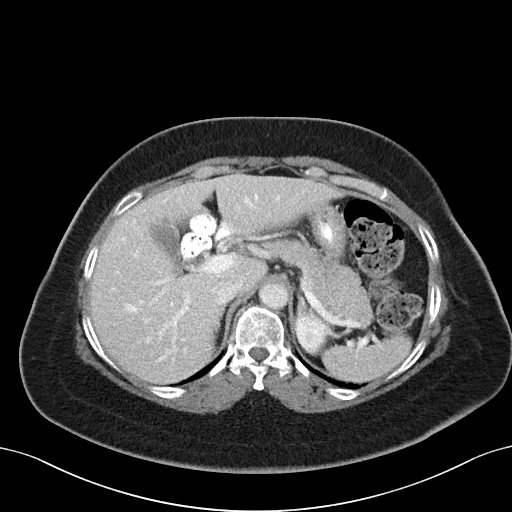
[im 89/94  soft-tissue]
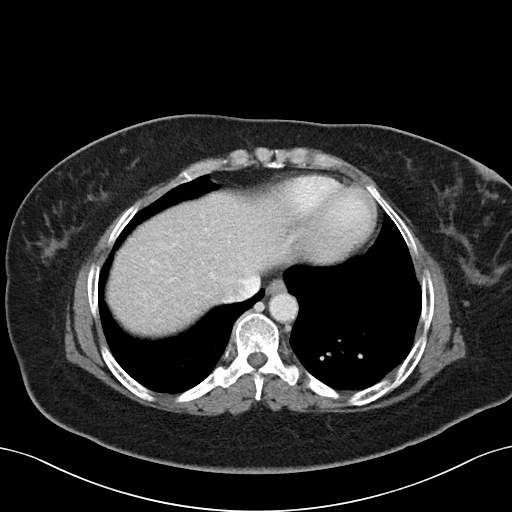

[Series 6: abd/pel w st · coronal · 0.76mm/px · 3 of 77 slices shown]
[im 26/77  soft-tissue]
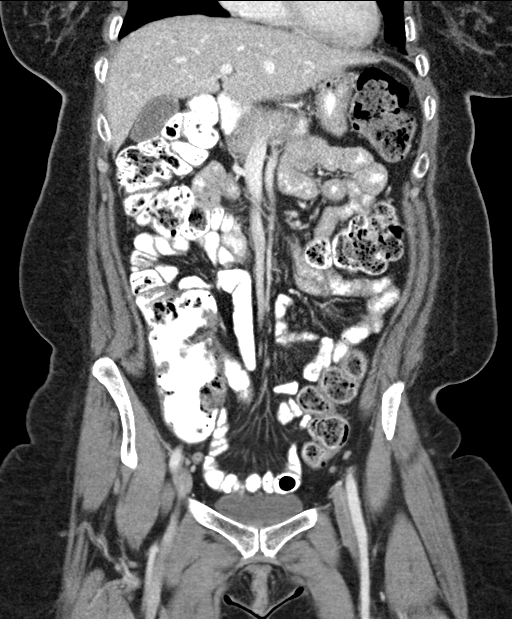
[im 34/77  soft-tissue]
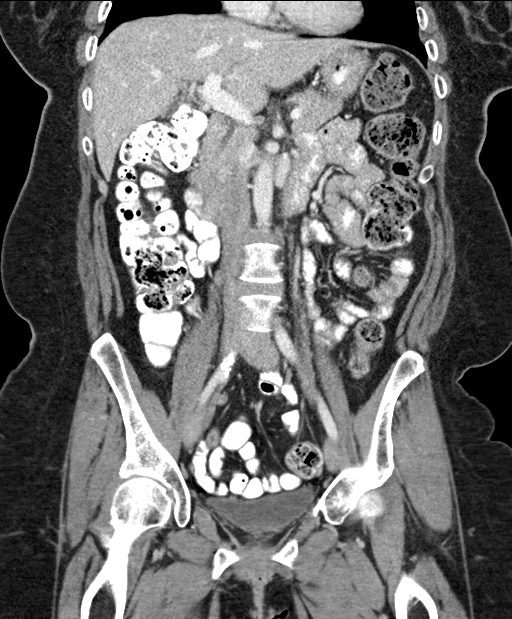
[im 43/77  soft-tissue]
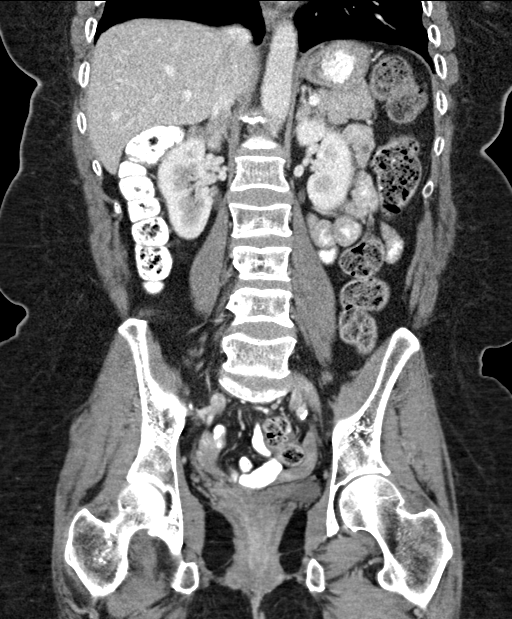

[16 of 46 positions shown; findings below may reference images not displayed]

FINDINGS: Lower chest: Lung bases are clear.

Hepatobiliary: No focal liver lesions are evident. Gallbladder wall
is not appreciably thickened. There is no biliary duct dilatation.

Pancreas: No pancreatic mass or inflammatory focus.

Spleen: No splenic lesions are evident.

Adrenals/Urinary Tract: Adrenals appear normal bilaterally. Kidneys
bilaterally show no evident mass or hydronephrosis on either side.
There is no renal or ureteral calculus on either side. Urinary
bladder is midline with wall thickness within normal limits.

Stomach/Bowel: There is again noted diffuse stool throughout the
colon. There is no bowel wall or mesenteric thickening. No evident
bowel obstruction. No free air or portal venous air.

Vascular/Lymphatic: There are foci of atherosclerotic calcification
in the aorta and right common iliac artery. There is mild
calcification in each hypogastric artery. There is no abdominal
aortic aneurysm. There is no evident adenopathy in the abdomen or
pelvis.

Reproductive: Uterus absent.  No pelvic mass.

Other: Appendix appears within normal limits. There is no abscess or
ascites in the abdomen or pelvis. There is a small ventral hernia. A
loop of small bowel extends to the base of the hernia without bowel
compromise. The remainder of the hernia contains fat.

Musculoskeletal: There is degenerative change in the lumbar spine.
There are no blastic or lytic bone lesions. A small bone island is
noted in the left acetabulum, unchanged. There is no intramuscular
or abdominal wall lesion.
IMPRESSION: There is diffuse stool throughout the colon, a finding also noted
previously. This finding may well be indicative of chronic
constipation. There is no bowel wall or mesenteric thickening. No
bowel obstruction. No abscess. Appendix appears normal.

Small ventral hernia without bowel compromise.

No renal or ureteral calculus.  No hydronephrosis.

There is aortoiliac atherosclerosis.

## 2018-11-02 DIAGNOSIS — Z1231 Encounter for screening mammogram for malignant neoplasm of breast: Secondary | ICD-10-CM | POA: Diagnosis not present

## 2018-11-02 DIAGNOSIS — Z803 Family history of malignant neoplasm of breast: Secondary | ICD-10-CM | POA: Diagnosis not present

## 2018-11-02 LAB — HM MAMMOGRAPHY

## 2019-01-13 DIAGNOSIS — H31009 Unspecified chorioretinal scars, unspecified eye: Secondary | ICD-10-CM | POA: Diagnosis not present

## 2019-01-13 DIAGNOSIS — H4421 Degenerative myopia, right eye: Secondary | ICD-10-CM | POA: Diagnosis not present

## 2019-01-13 DIAGNOSIS — H353121 Nonexudative age-related macular degeneration, left eye, early dry stage: Secondary | ICD-10-CM | POA: Diagnosis not present

## 2019-01-13 DIAGNOSIS — Z8669 Personal history of other diseases of the nervous system and sense organs: Secondary | ICD-10-CM | POA: Diagnosis not present

## 2019-01-13 DIAGNOSIS — H3342 Traction detachment of retina, left eye: Secondary | ICD-10-CM | POA: Diagnosis not present

## 2019-01-14 ENCOUNTER — Ambulatory Visit: Payer: Medicare Other | Admitting: Internal Medicine

## 2019-02-10 ENCOUNTER — Other Ambulatory Visit: Payer: Self-pay | Admitting: Internal Medicine

## 2019-02-10 ENCOUNTER — Ambulatory Visit: Payer: Self-pay | Admitting: Internal Medicine

## 2019-03-04 ENCOUNTER — Other Ambulatory Visit: Payer: Self-pay | Admitting: Internal Medicine

## 2019-03-28 ENCOUNTER — Other Ambulatory Visit: Payer: Self-pay | Admitting: *Deleted

## 2019-03-28 DIAGNOSIS — Z20822 Contact with and (suspected) exposure to covid-19: Secondary | ICD-10-CM

## 2019-03-28 NOTE — Progress Notes (Signed)
LA 

## 2019-03-29 ENCOUNTER — Other Ambulatory Visit: Payer: Self-pay | Admitting: Internal Medicine

## 2019-03-30 ENCOUNTER — Other Ambulatory Visit: Payer: Self-pay

## 2019-04-03 LAB — NOVEL CORONAVIRUS, NAA: SARS-CoV-2, NAA: NOT DETECTED

## 2019-04-06 ENCOUNTER — Telehealth: Payer: Self-pay

## 2019-04-06 NOTE — Telephone Encounter (Signed)
Called pt, LVM.   CRM created.  

## 2019-04-06 NOTE — Telephone Encounter (Signed)
-----   Message from Joycie Peek sent at 04/05/2019  1:39 PM EDT -----  ----- Message ----- From: Marin Roberts, RMA Sent: 04/05/2019   1:26 PM EDT To: [facility]  Attempted to reach pt, no answer. Left vm to call back. Forwarding result note to pcp's office on file to follow up on

## 2019-04-08 ENCOUNTER — Ambulatory Visit: Payer: Medicare Other | Admitting: Internal Medicine

## 2019-04-22 ENCOUNTER — Other Ambulatory Visit: Payer: Self-pay | Admitting: Internal Medicine

## 2019-05-18 ENCOUNTER — Other Ambulatory Visit: Payer: Self-pay | Admitting: Internal Medicine

## 2019-06-16 ENCOUNTER — Encounter: Payer: Medicare Other | Admitting: Internal Medicine

## 2019-06-16 ENCOUNTER — Other Ambulatory Visit: Payer: Self-pay | Admitting: Internal Medicine

## 2019-06-30 DIAGNOSIS — H3342 Traction detachment of retina, left eye: Secondary | ICD-10-CM | POA: Diagnosis not present

## 2019-06-30 DIAGNOSIS — H33012 Retinal detachment with single break, left eye: Secondary | ICD-10-CM | POA: Diagnosis not present

## 2019-06-30 DIAGNOSIS — Z8669 Personal history of other diseases of the nervous system and sense organs: Secondary | ICD-10-CM | POA: Diagnosis not present

## 2019-06-30 DIAGNOSIS — H31009 Unspecified chorioretinal scars, unspecified eye: Secondary | ICD-10-CM | POA: Diagnosis not present

## 2019-06-30 DIAGNOSIS — H4421 Degenerative myopia, right eye: Secondary | ICD-10-CM | POA: Diagnosis not present

## 2019-07-01 ENCOUNTER — Ambulatory Visit (INDEPENDENT_AMBULATORY_CARE_PROVIDER_SITE_OTHER): Payer: PRIVATE HEALTH INSURANCE | Admitting: Internal Medicine

## 2019-07-01 ENCOUNTER — Other Ambulatory Visit (INDEPENDENT_AMBULATORY_CARE_PROVIDER_SITE_OTHER): Payer: PRIVATE HEALTH INSURANCE

## 2019-07-01 ENCOUNTER — Other Ambulatory Visit: Payer: Self-pay | Admitting: Internal Medicine

## 2019-07-01 ENCOUNTER — Other Ambulatory Visit: Payer: Self-pay

## 2019-07-01 ENCOUNTER — Encounter: Payer: Self-pay | Admitting: Internal Medicine

## 2019-07-01 VITALS — BP 110/80 | HR 74 | Temp 97.9°F | Ht 64.0 in | Wt 237.0 lb

## 2019-07-01 DIAGNOSIS — R739 Hyperglycemia, unspecified: Secondary | ICD-10-CM

## 2019-07-01 DIAGNOSIS — E611 Iron deficiency: Secondary | ICD-10-CM

## 2019-07-01 DIAGNOSIS — E538 Deficiency of other specified B group vitamins: Secondary | ICD-10-CM

## 2019-07-01 DIAGNOSIS — E559 Vitamin D deficiency, unspecified: Secondary | ICD-10-CM

## 2019-07-01 DIAGNOSIS — Z23 Encounter for immunization: Secondary | ICD-10-CM | POA: Diagnosis not present

## 2019-07-01 DIAGNOSIS — Z0000 Encounter for general adult medical examination without abnormal findings: Secondary | ICD-10-CM

## 2019-07-01 LAB — CBC WITH DIFFERENTIAL/PLATELET
Basophils Absolute: 0 10*3/uL (ref 0.0–0.1)
Basophils Relative: 0.8 % (ref 0.0–3.0)
Eosinophils Absolute: 0.1 10*3/uL (ref 0.0–0.7)
Eosinophils Relative: 2.2 % (ref 0.0–5.0)
HCT: 40.2 % (ref 36.0–46.0)
Hemoglobin: 13.5 g/dL (ref 12.0–15.0)
Lymphocytes Relative: 39.6 % (ref 12.0–46.0)
Lymphs Abs: 2.3 10*3/uL (ref 0.7–4.0)
MCHC: 33.5 g/dL (ref 30.0–36.0)
MCV: 93.3 fl (ref 78.0–100.0)
Monocytes Absolute: 0.4 10*3/uL (ref 0.1–1.0)
Monocytes Relative: 6.9 % (ref 3.0–12.0)
Neutro Abs: 2.9 10*3/uL (ref 1.4–7.7)
Neutrophils Relative %: 50.5 % (ref 43.0–77.0)
Platelets: 265 10*3/uL (ref 150.0–400.0)
RBC: 4.31 Mil/uL (ref 3.87–5.11)
RDW: 14 % (ref 11.5–15.5)
WBC: 5.8 10*3/uL (ref 4.0–10.5)

## 2019-07-01 LAB — URINALYSIS, ROUTINE W REFLEX MICROSCOPIC
Bilirubin Urine: NEGATIVE
Hgb urine dipstick: NEGATIVE
Ketones, ur: NEGATIVE
Leukocytes,Ua: NEGATIVE
Nitrite: NEGATIVE
RBC / HPF: NONE SEEN (ref 0–?)
Specific Gravity, Urine: 1.015 (ref 1.000–1.030)
Total Protein, Urine: NEGATIVE
Urine Glucose: NEGATIVE
Urobilinogen, UA: 0.2 (ref 0.0–1.0)
pH: 7 (ref 5.0–8.0)

## 2019-07-01 LAB — LIPID PANEL
Cholesterol: 211 mg/dL — ABNORMAL HIGH (ref 0–200)
HDL: 61.2 mg/dL (ref >39.00)
LDL Cholesterol: 133 mg/dL — ABNORMAL HIGH (ref 0–99)
NonHDL: 150.02
Total CHOL/HDL Ratio: 3
Triglycerides: 85 mg/dL (ref 0.0–149.0)
VLDL: 17 mg/dL (ref 0.0–40.0)

## 2019-07-01 LAB — HEPATIC FUNCTION PANEL
ALT: 24 U/L (ref 0–35)
AST: 23 U/L (ref 0–37)
Albumin: 4 g/dL (ref 3.5–5.2)
Alkaline Phosphatase: 105 U/L (ref 39–117)
Bilirubin, Direct: 0.1 mg/dL (ref 0.0–0.3)
Total Bilirubin: 0.4 mg/dL (ref 0.2–1.2)
Total Protein: 7.5 g/dL (ref 6.0–8.3)

## 2019-07-01 LAB — BASIC METABOLIC PANEL
BUN: 9 mg/dL (ref 6–23)
CO2: 28 mEq/L (ref 19–32)
Calcium: 9.3 mg/dL (ref 8.4–10.5)
Chloride: 103 mEq/L (ref 96–112)
Creatinine, Ser: 0.57 mg/dL (ref 0.40–1.20)
GFR: 133.77 mL/min (ref >60.00)
Glucose, Bld: 94 mg/dL (ref 70–99)
Potassium: 4 mEq/L (ref 3.5–5.1)
Sodium: 138 mEq/L (ref 135–145)

## 2019-07-01 LAB — HEMOGLOBIN A1C: Hgb A1c MFr Bld: 5.8 % (ref 4.6–6.5)

## 2019-07-01 LAB — VITAMIN D 25 HYDROXY (VIT D DEFICIENCY, FRACTURES): VITD: 29.88 ng/mL — ABNORMAL LOW (ref 30.00–100.00)

## 2019-07-01 LAB — IBC PANEL
Iron: 69 ug/dL (ref 42–145)
Saturation Ratios: 19.3 % — ABNORMAL LOW (ref 20.0–50.0)
Transferrin: 256 mg/dL (ref 212.0–360.0)

## 2019-07-01 LAB — TSH: TSH: 0.97 u[IU]/mL (ref 0.35–4.50)

## 2019-07-01 LAB — VITAMIN B12: Vitamin B-12: 1238 pg/mL — ABNORMAL HIGH (ref 211–911)

## 2019-07-01 MED ORDER — VITAMIN D (ERGOCALCIFEROL) 1.25 MG (50000 UNIT) PO CAPS: 50000.0000 [IU] | ORAL_CAPSULE | ORAL | 0 refills | Status: DC

## 2019-07-01 MED ORDER — ZOSTER VAC RECOMB ADJUVANTED 50 MCG/0.5ML IM SUSR: 0.5000 mL | Freq: Once | INTRAMUSCULAR | 1 refills | Status: AC

## 2019-07-01 MED ORDER — SIMVASTATIN 40 MG PO TABS: 40.0000 mg | ORAL_TABLET | Freq: Every day | ORAL | 3 refills | Status: DC

## 2019-07-01 NOTE — Assessment & Plan Note (Signed)

## 2019-07-01 NOTE — Progress Notes (Signed)
Subjective:    Patient ID: [name], female    DOB: 07/24/1966, 53 y.o.   MRN: [id number]  HPI  Here for wellness and f/u;  Overall doing ok;  Pt denies Chest pain, worsening SOB, DOE, wheezing, orthopnea, PND, worsening LE edema, palpitations, dizziness or syncope.  Pt denies neurological change such as new headache, facial or extremity weakness.  Pt denies polydipsia, polyuria, or low sugar symptoms. Pt states overall good compliance with treatment and medications, good tolerability, and has been trying to follow appropriate diet.  Pt denies worsening depressive symptoms, suicidal ideation or panic. No fever, night sweats, wt loss, loss of appetite, or other constitutional symptoms.  Pt states good ability with ADL's, has low fall risk, home safety reviewed and adequate, no other significant changes in hearing or vision, and not active with exercise, admits to marked wt gain with the pandemic.  Wt Readings from Last 3 Encounters:  07/01/19 237 lb (107.5 kg)  10/10/18 175 lb (79.4 kg)  01/12/18 192 lb (87.1 kg)  Seeing optho, has silicone eye bubble ongoing and is not supposed to lie flat, only to lie on the side Past Medical History:  Diagnosis Date  . ANXIETY 03/27/2010  . BACK PAIN, CHRONIC 03/27/2010  . Classic migraine with aura 05/30/2015  . COMMON MIGRAINE 03/27/2010  . [place] DISEASE, CERVICAL 03/27/2010  . FATIGUE 03/27/2010  . GERD 03/27/2010  . Glaucoma    bilateral  . HYPERLIPIDEMIA 03/27/2010  . Multiple sclerosis ([place]) 10/23/2010   unconfirmed, but suspected  . Osteoporosis 09/24/2012  . Unspecified vitamin D deficiency 10/23/2010   Past Surgical History:  Procedure Laterality Date  . ABDOMINAL HYSTERECTOMY    . LEFT HEART CATHETERIZATION WITH CORONARY ANGIOGRAM N/A 03/31/2014   Procedure: LEFT HEART CATHETERIZATION WITH CORONARY ANGIOGRAM;  Surgeon: Pixie Casino, MD;  Location: [facility] CATH LAB;  Service: Cardiovascular;  Laterality: N/A;  . Neg stress test  2007  . s/p eye  surgury    . s/p knee surgury      reports that she has never smoked. She has never used smokeless tobacco. She reports that she does not drink alcohol or use drugs. family history includes Arthritis in an other family member; Cancer in her mother and another family member; Dementia in an other family member; Diabetes in her father and another family member; Heart disease in her father and another family member; Hyperlipidemia in her father and mother; Hypertension in her father; Lupus in her cousin and maternal aunt. Allergies  Allergen Reactions  . Crestor [Rosuvastatin Calcium] Other (See Comments)    Myalgias   . Lipitor [Atorvastatin] Other (See Comments)    myalgia  . Lovastatin Other (See Comments)    myalgias  . Statins Nausea And Vomiting  . Topamax Other (See Comments)    dizziness   Current Outpatient Medications on File Prior to Visit  Medication Sig Dispense Refill  . albuterol (PROVENTIL HFA;VENTOLIN HFA) 108 (90 Base) MCG/ACT inhaler Inhale 2 puffs into the lungs every 6 (six) hours as needed for wheezing or shortness of breath. 1 Inhaler 0  . amoxicillin-clavulanate (AUGMENTIN) 875-125 MG tablet Take 1 tablet by mouth every 12 (twelve) hours. 14 tablet 0  . aspirin 81 MG EC tablet Take 1 tablet (81 mg total) by mouth daily. Swallow whole. 30 tablet 12  . benzonatate (TESSALON) 200 MG capsule Take 1 capsule (200 mg total) by mouth 2 (two) times daily as needed for cough. 20 capsule 0  . brimonidine-timolol (  COMBIGAN) 0.2-0.5 % ophthalmic solution Place 1 drop into both eyes every 12 (twelve) hours.    . cholecalciferol (VITAMIN D) 1000 UNITS tablet Take 1,000 Units by mouth daily.    . cyclobenzaprine (FLEXERIL) 5 MG tablet Take 1 tablet (5 mg total) by mouth 3 (three) times daily as needed for muscle spasms. 60 tablet 2  . fluticasone (FLONASE) 50 MCG/ACT nasal spray Place 2 sprays into both nostrils daily. 16 g 2  . ibuprofen (ADVIL,MOTRIN) 200 MG tablet Take 400 mg by  mouth every 6 (six) hours as needed for fever, headache or mild pain.    Marland Kitchen ipratropium (ATROVENT) 0.03 % nasal spray Place 2 sprays into both nostrils 2 (two) times daily. Do not use for more than 5days. 30 mL 0  . simvastatin (ZOCOR) 40 MG tablet TAKE 1 TABLET BY MOUTH EVERY DAY 90 tablet 0   No current facility-administered medications on file prior to visit.    Review of Systems Constitutional: Negative for other unusual diaphoresis, sweats, appetite or weight changes HENT: Negative for other worsening hearing loss, ear pain, facial swelling, mouth sores or neck stiffness.   Eyes: Negative for other worsening pain, redness or other visual disturbance.  Respiratory: Negative for other stridor or swelling Cardiovascular: Negative for other palpitations or other chest pain  Gastrointestinal: Negative for worsening diarrhea or loose stools, blood in stool, distention or other pain Genitourinary: Negative for hematuria, flank pain or other change in urine volume.  Musculoskeletal: Negative for myalgias or other joint swelling.  Skin: Negative for other color change, or other wound or worsening drainage.  Neurological: Negative for other syncope or numbness. Hematological: Negative for other adenopathy or swelling Psychiatric/Behavioral: Negative for hallucinations, other worsening agitation, SI, self-injury, or new decreased concentration All otherwise neg per pt     Objective:   Physical Exam BP 110/80 (BP Location: Left Arm, Patient Position: Sitting, Cuff Size: Large)   Pulse 74   Temp 97.9 F (36.6 C) (Oral)   Ht 5\' 4"  (1.626 m)   Wt 237 lb (107.5 kg)   SpO2 96%   BMI 40.68 kg/m  VS noted,  Constitutional: Pt is oriented to person, place, and time. Appears well-developed and well-nourished, in no significant distress and comfortable Head: Normocephalic and atraumatic  Eyes: Conjunctivae and EOM are normal. Pupils are equal, round, and reactive to light Right Ear: External ear  normal without discharge Left Ear: External ear normal without discharge Nose: Nose without discharge or deformity Mouth/Throat: Oropharynx is without other ulcerations and moist  Neck: Normal range of motion. Neck supple. No JVD present. No tracheal deviation present or significant neck LA or mass Cardiovascular: Normal rate, regular rhythm, normal heart sounds and intact distal pulses.   Pulmonary/Chest: WOB normal and breath sounds without rales or wheezing  Abdominal: Soft. Bowel sounds are normal. NT. No HSM  Musculoskeletal: Normal range of motion. Exhibits no edema Lymphadenopathy: Has no other cervical adenopathy.  Neurological: Pt is alert and oriented to person, place, and time. Pt has normal reflexes. No cranial nerve deficit. Motor grossly intact, Gait intact Skin: Skin is warm and dry. No rash noted or new ulcerations Psychiatric:  Has normal mood and affect. Behavior is normal without agitation All otherwise neg per pt Lab Results  Component Value Date   WBC 4.2 01/12/2018   HGB 14.5 01/12/2018   HCT 42.2 01/12/2018   PLT 287.0 01/12/2018   GLUCOSE 79 01/12/2018   CHOL 222 (H) 01/12/2018   TRIG 74.0 01/12/2018  HDL 58.20 01/12/2018   LDLDIRECT 184.9 08/04/2012   LDLCALC 149 (H) 01/12/2018   ALT 25 01/12/2018   AST 28 01/12/2018   NA 138 01/12/2018   K 4.0 01/12/2018   CL 103 01/12/2018   CREATININE 0.63 01/12/2018   BUN 12 01/12/2018   CO2 27 01/12/2018   TSH 0.82 01/12/2018   INR 0.99 05/24/2015         Assessment & Plan:

## 2019-07-01 NOTE — Patient Instructions (Addendum)
You had the flu shot today  Your shingles shot prescription was sent to the pharmacy  Ok to use the voltaren gel OTC for the left neck pain and grinding  Please continue all other medications as before, and refills have been done if requested.  Please have the pharmacy call with any other refills you may need.  Please continue your efforts at being more active, low cholesterol diet, and weight control.  You are otherwise up to date with prevention measures today.  Please keep your appointments with your specialists as you may have planned  You will be contacted regarding the referral for: colonoscopy  Please go to the LAB in the Basement (turn left off the elevator) for the tests to be done today  You will be contacted by phone if any changes need to be made immediately.  Otherwise, you will receive a letter about your results with an explanation, but please check with MyChart first.  Please remember to sign up for MyChart if you have not done so, as this will be important to you in the future with finding out test results, communicating by private email, and scheduling acute appointments online when needed.  Please return in 1 year for your yearly visit, or sooner if needed, with Lab testing done 3-5 days before

## 2019-07-01 NOTE — Assessment & Plan Note (Signed)
stable overall by history and exam, recent data reviewed with pt, and pt to continue medical treatment as before,  to f/u any worsening symptoms or concerns  

## 2019-07-21 DIAGNOSIS — H401132 Primary open-angle glaucoma, bilateral, moderate stage: Secondary | ICD-10-CM | POA: Diagnosis not present

## 2019-08-09 ENCOUNTER — Encounter: Payer: Self-pay | Admitting: Gastroenterology

## 2019-08-20 DIAGNOSIS — H538 Other visual disturbances: Secondary | ICD-10-CM | POA: Diagnosis not present

## 2019-08-20 DIAGNOSIS — T1510XA Foreign body in conjunctival sac, unspecified eye, initial encounter: Secondary | ICD-10-CM | POA: Diagnosis not present

## 2019-08-20 DIAGNOSIS — H33009 Unspecified retinal detachment with retinal break, unspecified eye: Secondary | ICD-10-CM | POA: Diagnosis not present

## 2019-08-23 DIAGNOSIS — H33012 Retinal detachment with single break, left eye: Secondary | ICD-10-CM | POA: Diagnosis not present

## 2019-08-23 DIAGNOSIS — Z8669 Personal history of other diseases of the nervous system and sense organs: Secondary | ICD-10-CM | POA: Diagnosis not present

## 2019-08-23 DIAGNOSIS — H3342 Traction detachment of retina, left eye: Secondary | ICD-10-CM | POA: Diagnosis not present

## 2019-08-23 DIAGNOSIS — H4421 Degenerative myopia, right eye: Secondary | ICD-10-CM | POA: Diagnosis not present

## 2019-08-29 ENCOUNTER — Ambulatory Visit (AMBULATORY_SURGERY_CENTER): Payer: PRIVATE HEALTH INSURANCE | Admitting: *Deleted

## 2019-08-29 ENCOUNTER — Other Ambulatory Visit: Payer: Self-pay

## 2019-08-29 VITALS — Temp 96.0°F | Ht 64.0 in | Wt 234.0 lb

## 2019-08-29 DIAGNOSIS — Z1211 Encounter for screening for malignant neoplasm of colon: Secondary | ICD-10-CM

## 2019-08-29 DIAGNOSIS — Z1159 Encounter for screening for other viral diseases: Secondary | ICD-10-CM

## 2019-08-29 MED ORDER — NA SULFATE-K SULFATE-MG SULF 17.5-3.13-1.6 GM/177ML PO SOLN: ORAL | 0 refills | Status: DC

## 2019-08-29 NOTE — Progress Notes (Signed)
Patient is here in-person for PV. Patient denies any allergies to eggs or soy. Patient denies any problems with anesthesia/sedation. Patient denies any oxygen use at home. Patient denies taking any diet/weight loss medications or blood thinners. Patient is not being treated for MRSA or C-diff. EMMI education assisgned to the patient for the procedure, this was explained and instructions given to patient. COVID-19 screening test is on Wed. 12/30 1120 am, the pt is aware. Pt is aware that care partner will wait in the car during procedure; if they feel like they will be too hot or cold to wait in the car; they may wait in the 4 th floor lobby. Patient is aware to bring only one care partner. We want them to wear a mask (we do not have any that we can provide them), practice social distancing, and we will check their temperatures when they get here.  I did remind the patient that their care partner needs to stay in the parking lot the entire time and have a cell phone available, we will call them when the pt is ready for discharge. Patient will wear mask into building.    Suprep $15 off coupon given to the patient.

## 2019-09-07 ENCOUNTER — Ambulatory Visit (INDEPENDENT_AMBULATORY_CARE_PROVIDER_SITE_OTHER): Payer: PRIVATE HEALTH INSURANCE

## 2019-09-07 ENCOUNTER — Other Ambulatory Visit: Payer: Self-pay | Admitting: Gastroenterology

## 2019-09-07 DIAGNOSIS — Z1159 Encounter for screening for other viral diseases: Secondary | ICD-10-CM

## 2019-09-08 LAB — SARS CORONAVIRUS 2 (TAT 6-24 HRS): SARS Coronavirus 2: NEGATIVE

## 2019-09-12 ENCOUNTER — Encounter: Payer: Self-pay | Admitting: Gastroenterology

## 2019-09-12 ENCOUNTER — Other Ambulatory Visit: Payer: Self-pay

## 2019-09-12 ENCOUNTER — Ambulatory Visit (AMBULATORY_SURGERY_CENTER): Payer: PRIVATE HEALTH INSURANCE | Admitting: Gastroenterology

## 2019-09-12 VITALS — BP 100/68 | HR 73 | Temp 98.6°F | Resp 19 | Ht 64.0 in | Wt 234.0 lb

## 2019-09-12 DIAGNOSIS — D125 Benign neoplasm of sigmoid colon: Secondary | ICD-10-CM

## 2019-09-12 DIAGNOSIS — D123 Benign neoplasm of transverse colon: Secondary | ICD-10-CM | POA: Diagnosis not present

## 2019-09-12 DIAGNOSIS — D120 Benign neoplasm of cecum: Secondary | ICD-10-CM | POA: Diagnosis not present

## 2019-09-12 DIAGNOSIS — Z1211 Encounter for screening for malignant neoplasm of colon: Secondary | ICD-10-CM | POA: Diagnosis not present

## 2019-09-12 MED ORDER — SODIUM CHLORIDE 0.9 % IV SOLN: 500.0000 mL | Freq: Once | INTRAVENOUS | Status: DC

## 2019-09-12 NOTE — Progress Notes (Signed)
Called to room to assist during endoscopic procedure.  Patient ID and intended procedure confirmed with present staff. Received instructions for my participation in the procedure from the performing physician.  

## 2019-09-12 NOTE — Patient Instructions (Signed)
Handout given on polyps.  YOU HAD AN ENDOSCOPIC PROCEDURE TODAY AT [facility]:   Refer to the procedure report that was given to you for any specific questions about what was found during the examination.  If the procedure report does not answer your questions, please call your gastroenterologist to clarify.  If you requested that your care partner not be given the details of your procedure findings, then the procedure report has been included in a sealed envelope for you to review at your convenience later.  YOU SHOULD EXPECT: Some feelings of bloating in the abdomen. Passage of more gas than usual.  Walking can help get rid of the air that was put into your GI tract during the procedure and reduce the bloating. If you had a lower endoscopy (such as a colonoscopy or flexible sigmoidoscopy) you may notice spotting of blood in your stool or on the toilet paper. If you underwent a bowel prep for your procedure, you may not have a normal bowel movement for a few days.  Please Note:  You might notice some irritation and congestion in your nose or some drainage.  This is from the oxygen used during your procedure.  There is no need for concern and it should clear up in a day or so.  SYMPTOMS TO REPORT IMMEDIATELY:   Following lower endoscopy (colonoscopy or flexible sigmoidoscopy):  Excessive amounts of blood in the stool  Significant tenderness or worsening of abdominal pains  Swelling of the abdomen that is new, acute  Fever of 100F or higher  For urgent or emergent issues, a gastroenterologist can be reached at any hour by calling [phone].   DIET:  We do recommend a small meal at first, but then you may proceed to your regular diet.  Drink plenty of fluids but you should avoid alcoholic beverages for 24 hours.  ACTIVITY:  You should plan to take it easy for the rest of today and you should NOT DRIVE or use heavy machinery until tomorrow (because of the sedation medicines  used during the test).    FOLLOW UP: Our staff will call the number listed on your records 48-72 hours following your procedure to check on you and address any questions or concerns that you may have regarding the information given to you following your procedure. If we do not reach you, we will leave a message.  We will attempt to reach you two times.  During this call, we will ask if you have developed any symptoms of COVID 19. If you develop any symptoms (ie: fever, flu-like symptoms, shortness of breath, cough etc.) before then, please call [phone].  If you test positive for Covid 19 in the 2 weeks post procedure, please call and report this information to [place].    If any biopsies were taken you will be contacted by phone or by letter within the next 1-3 weeks.  Please call us at [phone] if you have not heard about the biopsies in 3 weeks.    SIGNATURES/CONFIDENTIALITY: You and/or your care partner have signed paperwork which will be entered into your electronic medical record.  These signatures attest to the fact that that the information above on your After Visit Summary has been reviewed and is understood.  Full responsibility of the confidentiality of this discharge information lies with you and/or your care-partner.

## 2019-09-12 NOTE — Op Note (Signed)
[facility] Patient Name: [name] Procedure Date: 09/12/2019 7:58 AM MRN: [id number] Endoscopist: Ladene Artist , MD Age: 54 Referring MD:  Date of Birth: Jan 15, 1966 Gender: Female Account #: [id number] Procedure:                Colonoscopy Indications:              Screening for colorectal malignant neoplasm Medicines:                Monitored Anesthesia Care Procedure:                Pre-Anesthesia Assessment:                           - Prior to the procedure, a History and Physical                            was performed, and patient medications and                            allergies were reviewed. The patient's tolerance of                            previous anesthesia was also reviewed. The risks                            and benefits of the procedure and the sedation                            options and risks were discussed with the patient.                            All questions were answered, and informed consent                            was obtained. Prior Anticoagulants: The patient has                            taken no previous anticoagulant or antiplatelet                            agents. ASA Grade Assessment: II - A patient with                            mild systemic disease. After reviewing the risks                            and benefits, the patient was deemed in                            satisfactory condition to undergo the procedure.                           After obtaining informed consent, the colonoscope  was passed under direct vision. Throughout the                            procedure, the patient's blood pressure, pulse, and                            oxygen saturations were monitored continuously. The                            Colonoscope was introduced through the anus and                            advanced to the the cecum, identified by                            appendiceal orifice and  ileocecal valve. The                            ileocecal valve, appendiceal orifice, and rectum                            were photographed. The quality of the bowel                            preparation was excellent. The colonoscopy was                            performed without difficulty. The patient tolerated                            the procedure well. Scope In: 8:11:43 AM Scope Out: 8:29:00 AM Scope Withdrawal Time: 0 hours 12 minutes 45 seconds  Total Procedure Duration: 0 hours 17 minutes 17 seconds  Findings:                 The perianal and digital rectal examinations were                            normal.                           A 3 mm polyp was found in the cecum. The polyp was                            sessile. The polyp was removed with a cold biopsy                            forceps. Resection and retrieval were complete.                           Three sessile polyps were found in the sigmoid                            colon (2) and transverse colon (1). The polyps were  7 to 8 mm in size. These polyps were removed with a                            cold snare. Resection and retrieval were complete.                           The exam was otherwise without abnormality on                            direct and retroflexion views. Complications:            No immediate complications. Estimated blood loss:                            None. Estimated Blood Loss:     Estimated blood loss: none. Impression:               - One 3 mm polyp in the cecum, removed with a cold                            biopsy forceps. Resected and retrieved.                           - Three 7 to 8 mm polyps in the sigmoid colon and                            in the transverse colon, removed with a cold snare.                            Resected and retrieved.                           - The examination was otherwise normal on direct                            and  retroflexion views. Recommendation:           - Repeat colonoscopy after studies are complete for                            surveillance based on pathology results.                           - Patient has a contact number available for                            emergencies. The signs and symptoms of potential                            delayed complications were discussed with the                            patient. Return to normal activities tomorrow.  Written discharge instructions were provided to the                            patient.                           - Resume previous diet.                           - Continue present medications.                           - Await pathology results. Ladene Artist, MD 09/12/2019 8:31:58 AM This report has been signed electronically.

## 2019-09-12 NOTE — Progress Notes (Signed)
Temp by YF, Vitals by DT.  Pt's states no medical or surgical changes since previsit or office visit.

## 2019-09-12 NOTE — Progress Notes (Signed)
A and O x3. Report to RN. Tolerated MAC anesthesia well.

## 2019-09-14 ENCOUNTER — Telehealth: Payer: Self-pay

## 2019-09-14 NOTE — Telephone Encounter (Signed)
  Follow up Call-  Call back number 09/12/2019  Post procedure Call Back phone  # [phone]  Permission to leave phone message Yes  Some recent data might be hidden     Patient questions:  Do you have a fever, pain , or abdominal swelling? No. Pain Score  0 *  Have you tolerated food without any problems? Yes.    Have you been able to return to your normal activities? Yes.    Do you have any questions about your discharge instructions: Diet   No. Medications  No. Follow up visit  No.  Do you have questions or concerns about your Care? No.  Actions: * If pain score is 4 or above: No action needed, pain <4.  1. Have you developed a fever since your procedure? no  2.   Have you had an respiratory symptoms (SOB or cough) since your procedure? no  3.   Have you tested positive for COVID 19 since your procedure no  4.   Have you had any family members/close contacts diagnosed with the COVID 19 since your procedure?  no   If yes to any of these questions please route to Joylene John, RN and Alphonsa Gin, Therapist, sports.

## 2019-09-16 ENCOUNTER — Encounter: Payer: Self-pay | Admitting: Gastroenterology

## 2019-09-21 ENCOUNTER — Other Ambulatory Visit: Payer: Self-pay | Admitting: Internal Medicine

## 2019-11-11 LAB — HM MAMMOGRAPHY

## 2019-11-19 ENCOUNTER — Ambulatory Visit: Payer: PRIVATE HEALTH INSURANCE

## 2019-11-19 ENCOUNTER — Ambulatory Visit: Payer: PRIVATE HEALTH INSURANCE | Attending: Internal Medicine

## 2019-11-19 DIAGNOSIS — Z23 Encounter for immunization: Secondary | ICD-10-CM

## 2019-11-19 NOTE — Progress Notes (Signed)
   Covid-19 Vaccination Clinic  Name:  [name]    MRN: [id number] DOB: 1966/06/15  11/19/2019  [name] was observed post Covid-19 immunization for 15 minutes without incident. She was provided with Vaccine Information Sheet and instruction to access the V-Safe system.   [name] was instructed to call 911 with any severe reactions post vaccine: Marland Kitchen Difficulty breathing  . Swelling of face and throat  . A fast heartbeat  . A bad rash all over body  . Dizziness and weakness   Immunizations Administered    Name Date Dose VIS Date Route   Pfizer COVID-19 Vaccine 11/19/2019 11:51 AM 0.3 mL 08/19/2019 Intramuscular   Manufacturer: [place]   Lot: [id number]   [place]: [id number]

## 2019-11-22 DIAGNOSIS — H01116 Allergic dermatitis of left eye, unspecified eyelid: Secondary | ICD-10-CM | POA: Diagnosis not present

## 2019-11-22 DIAGNOSIS — H4421 Degenerative myopia, right eye: Secondary | ICD-10-CM | POA: Diagnosis not present

## 2019-11-22 DIAGNOSIS — H31009 Unspecified chorioretinal scars, unspecified eye: Secondary | ICD-10-CM | POA: Diagnosis not present

## 2019-11-22 DIAGNOSIS — H33022 Retinal detachment with multiple breaks, left eye: Secondary | ICD-10-CM | POA: Diagnosis not present

## 2019-11-22 DIAGNOSIS — Z8669 Personal history of other diseases of the nervous system and sense organs: Secondary | ICD-10-CM | POA: Diagnosis not present

## 2019-11-23 ENCOUNTER — Encounter: Payer: Self-pay | Admitting: Internal Medicine

## 2019-11-23 ENCOUNTER — Other Ambulatory Visit: Payer: Self-pay | Admitting: Radiology

## 2019-11-23 DIAGNOSIS — R928 Other abnormal and inconclusive findings on diagnostic imaging of breast: Secondary | ICD-10-CM | POA: Diagnosis not present

## 2019-11-23 LAB — HM MAMMOGRAPHY

## 2019-11-25 DIAGNOSIS — Z6839 Body mass index (BMI) 39.0-39.9, adult: Secondary | ICD-10-CM | POA: Diagnosis not present

## 2019-11-25 DIAGNOSIS — Z130 Encounter for screening for diseases of the blood and blood-forming organs and certain disorders involving the immune mechanism: Secondary | ICD-10-CM | POA: Diagnosis not present

## 2019-11-25 DIAGNOSIS — E669 Obesity, unspecified: Secondary | ICD-10-CM | POA: Diagnosis not present

## 2019-11-25 DIAGNOSIS — Z01419 Encounter for gynecological examination (general) (routine) without abnormal findings: Secondary | ICD-10-CM | POA: Diagnosis not present

## 2019-11-25 DIAGNOSIS — Z1389 Encounter for screening for other disorder: Secondary | ICD-10-CM | POA: Diagnosis not present

## 2019-11-28 DIAGNOSIS — H31003 Unspecified chorioretinal scars, bilateral: Secondary | ICD-10-CM | POA: Diagnosis not present

## 2019-11-28 DIAGNOSIS — H401132 Primary open-angle glaucoma, bilateral, moderate stage: Secondary | ICD-10-CM | POA: Diagnosis not present

## 2019-12-05 DIAGNOSIS — H01116 Allergic dermatitis of left eye, unspecified eyelid: Secondary | ICD-10-CM | POA: Diagnosis not present

## 2019-12-05 DIAGNOSIS — H4052X2 Glaucoma secondary to other eye disorders, left eye, moderate stage: Secondary | ICD-10-CM | POA: Diagnosis not present

## 2019-12-05 DIAGNOSIS — Z888 Allergy status to other drugs, medicaments and biological substances status: Secondary | ICD-10-CM | POA: Diagnosis not present

## 2019-12-07 ENCOUNTER — Encounter: Payer: Self-pay | Admitting: Internal Medicine

## 2019-12-07 ENCOUNTER — Other Ambulatory Visit: Payer: Self-pay | Admitting: Radiology

## 2019-12-07 DIAGNOSIS — D241 Benign neoplasm of right breast: Secondary | ICD-10-CM | POA: Diagnosis not present

## 2019-12-07 DIAGNOSIS — R928 Other abnormal and inconclusive findings on diagnostic imaging of breast: Secondary | ICD-10-CM | POA: Diagnosis not present

## 2019-12-12 ENCOUNTER — Ambulatory Visit: Payer: PRIVATE HEALTH INSURANCE

## 2019-12-12 ENCOUNTER — Telehealth: Payer: Self-pay

## 2019-12-12 NOTE — Telephone Encounter (Signed)
Faxed and confirmed biopsy order on 12-07-2019 to [facility]

## 2019-12-13 ENCOUNTER — Ambulatory Visit: Payer: PRIVATE HEALTH INSURANCE | Attending: Internal Medicine

## 2019-12-13 DIAGNOSIS — Z23 Encounter for immunization: Secondary | ICD-10-CM

## 2019-12-13 NOTE — Progress Notes (Signed)
   Covid-19 Vaccination Clinic  Name:  [name]    MRN: [id number] DOB: 03-17-1966  12/13/2019  [name] was observed post Covid-19 immunization for 15 minutes without incident. She was provided with Vaccine Information Sheet and instruction to access the V-Safe system.   [name] was instructed to call 911 with any severe reactions post vaccine: Marland Kitchen Difficulty breathing  . Swelling of face and throat  . A fast heartbeat  . A bad rash all over body  . Dizziness and weakness   Immunizations Administered    Name Date Dose VIS Date Route   Pfizer COVID-19 Vaccine 12/13/2019  2:52 PM 0.3 mL 08/19/2019 Intramuscular   Manufacturer: [place]   Lot: [id number]   Ferdinand: [id number]

## 2019-12-16 ENCOUNTER — Encounter: Payer: Self-pay | Admitting: Internal Medicine

## 2019-12-22 ENCOUNTER — Encounter (INDEPENDENT_AMBULATORY_CARE_PROVIDER_SITE_OTHER): Payer: PRIVATE HEALTH INSURANCE | Admitting: Ophthalmology

## 2019-12-30 ENCOUNTER — Ambulatory Visit: Payer: PRIVATE HEALTH INSURANCE | Admitting: Internal Medicine

## 2020-01-23 ENCOUNTER — Ambulatory Visit: Payer: PRIVATE HEALTH INSURANCE | Admitting: Internal Medicine

## 2020-03-01 DIAGNOSIS — H401132 Primary open-angle glaucoma, bilateral, moderate stage: Secondary | ICD-10-CM | POA: Diagnosis not present

## 2020-03-01 DIAGNOSIS — H31003 Unspecified chorioretinal scars, bilateral: Secondary | ICD-10-CM | POA: Diagnosis not present

## 2020-03-10 ENCOUNTER — Ambulatory Visit
Admission: EM | Admit: 2020-03-10 | Discharge: 2020-03-10 | Disposition: A | Discharge status: Home / Self Care | Payer: Medicare Other | Attending: Emergency Medicine | Admitting: Emergency Medicine

## 2020-03-10 ENCOUNTER — Other Ambulatory Visit: Payer: Self-pay

## 2020-03-10 ENCOUNTER — Encounter: Payer: Self-pay | Admitting: Emergency Medicine

## 2020-03-10 DIAGNOSIS — M25531 Pain in right wrist: Secondary | ICD-10-CM

## 2020-03-10 DIAGNOSIS — M25431 Effusion, right wrist: Secondary | ICD-10-CM | POA: Diagnosis not present

## 2020-03-10 DIAGNOSIS — T63441A Toxic effect of venom of bees, accidental (unintentional), initial encounter: Secondary | ICD-10-CM | POA: Diagnosis not present

## 2020-03-10 MED ORDER — METHYLPREDNISOLONE SODIUM SUCC 125 MG IJ SOLR
125.0000 mg | Freq: Once | INTRAMUSCULAR | Status: AC
  Administered 2020-03-10: 125 mg via INTRAMUSCULAR

## 2020-03-10 NOTE — Discharge Instructions (Signed)
You are given steroid shot today. Recommend taking Benadryl for the next few nights. Go to ER for worsening rash, swelling, pain, chest pain, difficulty breathing, swelling of lips, tongue, throat.

## 2020-03-10 NOTE — ED Notes (Signed)
Patient able to ambulate independently  

## 2020-03-10 NOTE — ED Provider Notes (Signed)
[facility]    CSN: [id number] Arrival date & time: 03/10/20  1118      History   Chief Complaint Chief Complaint  Patient presents with  . Insect Bite    HPI [name] is a 54 y.o. female presenting for right wrist pain and swelling s/p bee sting.  Patient endorsing allergies to bee stings, though denies anaphylaxis.  States this occurred 1.5 hours PTA.  Took Benadryl with some relief.  No chest pain, difficulty breathing, rash, swelling of lips, tongue, throat.   Past Medical History:  Diagnosis Date  . ANXIETY 03/27/2010  . BACK PAIN, CHRONIC 03/27/2010  . Classic migraine with aura 05/30/2015  . COMMON MIGRAINE 03/27/2010  . [place] DISEASE, CERVICAL 03/27/2010  . FATIGUE 03/27/2010  . GERD 03/27/2010  . Glaucoma    bilateral  . HYPERLIPIDEMIA 03/27/2010  . Multiple sclerosis ([place]) 10/23/2010   unconfirmed, but suspected  . Myocardial infarction [facility])    "old"  . Osteoporosis 09/24/2012  . Unspecified vitamin D deficiency 10/23/2010    Patient Active Problem List   Diagnosis Date Noted  . Hyperglycemia 07/01/2019  . Perimenopause 01/07/2017  . Right leg pain 12/31/2016  . Cervicalgia 11/13/2016  . Familial hypercholesterolemia 01/22/2016  . Coronary artery calcification seen on CT scan 01/04/2016  . Classic migraine with aura 05/30/2015  . Paresthesia 12/07/2014  . Chest pain 03/28/2014  . Palpitations 03/28/2014  . Myalgia 12/14/2013  . Osteoporosis 09/24/2012  . Encounter for long-term (current) use of high-risk medication 05/26/2011  . Preventative health care 04/23/2011  . Allergic rhinitis 12/10/2010  . Unspecified vitamin D deficiency 10/23/2010  . Multiple sclerosis ([place]) 10/23/2010  . Hyperlipidemia 03/27/2010  . ANXIETY 03/27/2010  . Migraine without aura 03/27/2010  . GERD 03/27/2010  . [place] DISEASE, CERVICAL 03/27/2010  . BACK PAIN, CHRONIC 03/27/2010    Past Surgical History:  Procedure Laterality Date  . ABDOMINAL HYSTERECTOMY     . LEFT HEART CATHETERIZATION WITH CORONARY ANGIOGRAM N/A 03/31/2014   Procedure: LEFT HEART CATHETERIZATION WITH CORONARY ANGIOGRAM;  Surgeon: Pixie Casino, MD;  Location: [facility] CATH LAB;  Service: Cardiovascular;  Laterality: N/A;  . Neg stress test  2007  . s/p eye surgury    . s/p knee surgury      OB History   No obstetric history on file.      Home Medications    Prior to Admission medications   Medication Sig Start Date End Date Taking? Authorizing Provider  aspirin 81 MG EC tablet Take 1 tablet (81 mg total) by mouth daily. Swallow whole. 01/07/17   Biagio Borg, MD  brimonidine-timolol (COMBIGAN) 0.2-0.5 % ophthalmic solution Place 1 drop into both eyes every 12 (twelve) hours.    [provider]  cholecalciferol (VITAMIN D) 1000 UNITS tablet Take 1,000 Units by mouth daily.    [provider]  ibuprofen (ADVIL,MOTRIN) 200 MG tablet Take 400 mg by mouth every 6 (six) hours as needed for fever, headache or mild pain.    [provider]  simvastatin (ZOCOR) 40 MG tablet Take 1 tablet (40 mg total) by mouth daily. 07/01/19   Biagio Borg, MD  Timolol Maleate 0.5 % (DAILY) SOLN timolol maleate 0.5 % soln    [provider]    Family History Family History  Problem Relation Age of Onset  . Heart disease Father   . Diabetes Father   . Hyperlipidemia Father   . Hypertension Father   . Colon polyps  Father   . Cancer Mother        breast  . Hyperlipidemia Mother   . Colon polyps Mother   . Diabetes Other   . Arthritis Other   . Dementia Other   . Cancer Other        2 uncles with lung cancer  . Heart disease Other        2 uncles with heart disease  . Lupus Maternal Aunt   . Lupus Cousin        mother and father's side  . Colon cancer Neg Hx   . Esophageal cancer Neg Hx   . Stomach cancer Neg Hx   . Rectal cancer Neg Hx     Social History Social History   Tobacco Use  . Smoking status: Never Smoker  . Smokeless tobacco:  Never Used  Vaping Use  . Vaping Use: Never used  Substance Use Topics  . Alcohol use: No    Alcohol/week: 0.0 standard drinks  . Drug use: No     Allergies   Crestor [rosuvastatin calcium], Lipitor [atorvastatin], Lovastatin, Statins, and Topamax   Review of Systems As per HPI   Physical Exam Triage Vital Signs ED Triage Vitals  Enc Vitals Group     BP      Pulse      Resp      Temp      Temp src      SpO2      Weight      Height      Head Circumference      Peak Flow      Pain Score      Pain Loc      Pain Edu?      Excl. in [place]?    No data found.  Updated Vital Signs BP 112/78 (BP Location: Left Arm)   Pulse 72   Temp 98.6 F (37 C) (Oral)   Resp 16   SpO2 98%   Visual Acuity Right Eye Distance:   Left Eye Distance:   Bilateral Distance:    Right Eye Near:   Left Eye Near:    Bilateral Near:     Physical Exam Constitutional:      General: She is not in acute distress. HENT:     Head: Normocephalic and atraumatic.     Mouth/Throat:     Mouth: Mucous membranes are moist.     Pharynx: Oropharynx is clear.     Comments: Negative angioedema Eyes:     General: No scleral icterus.    Pupils: Pupils are equal, round, and reactive to light.  Cardiovascular:     Rate and Rhythm: Normal rate.  Pulmonary:     Effort: Pulmonary effort is normal.  Skin:    Coloration: Skin is not jaundiced or pale.  Neurological:     Mental Status: She is alert and oriented to person, place, and time.      UC Treatments / Results  Labs (all labs ordered are listed, but only abnormal results are displayed) Labs Reviewed - No data to display  EKG   Radiology No results found.  Procedures Procedures (including critical care time)  Medications Ordered in UC Medications  methylPREDNISolone sodium succinate (SOLU-MEDROL) 125 mg/2 mL injection 125 mg (has no administration in time range)    Initial Impression / Assessment and Plan / UC Course  I have  reviewed the triage vital signs and the nursing notes.  Pertinent labs & imaging  results that were available during my care of the patient were reviewed by me and considered in my medical decision making (see chart for details).     Patient febrile, nontoxic, hemodynamically stable.  No airway compromise.  Given Solu-Medrol office which he tolerated well.  Will continue Benadryl for the next few nights.  Return precautions discussed, patient verbalized understanding and is agreeable to plan. Final Clinical Impressions(s) / UC Diagnoses   Final diagnoses:  Bee sting reaction, accidental or unintentional, initial encounter  Pain and swelling of right wrist     Discharge Instructions     You are given steroid shot today. Recommend taking Benadryl for the next few nights. Go to ER for worsening rash, swelling, pain, chest pain, difficulty breathing, swelling of lips, tongue, throat.    ED Prescriptions    None     PDMP not reviewed this encounter.   Hall[place]Potvin, [place], [place] 03/10/20 1141

## 2020-03-10 NOTE — ED Triage Notes (Signed)
t presents to [facility] for assessment after being stung to right wrist by a bee this morning.  C/o itchiness to bilateral arms, denies difficulty swallowing, throat tightness, or difficulty breathing.

## 2020-03-21 ENCOUNTER — Ambulatory Visit: Payer: PRIVATE HEALTH INSURANCE | Admitting: Internal Medicine

## 2020-04-05 ENCOUNTER — Encounter (INDEPENDENT_AMBULATORY_CARE_PROVIDER_SITE_OTHER): Payer: PRIVATE HEALTH INSURANCE | Admitting: Ophthalmology

## 2020-04-09 ENCOUNTER — Ambulatory Visit (INDEPENDENT_AMBULATORY_CARE_PROVIDER_SITE_OTHER): Payer: PRIVATE HEALTH INSURANCE | Admitting: Ophthalmology

## 2020-04-09 ENCOUNTER — Encounter (INDEPENDENT_AMBULATORY_CARE_PROVIDER_SITE_OTHER): Payer: Self-pay | Admitting: Ophthalmology

## 2020-04-09 ENCOUNTER — Other Ambulatory Visit: Payer: Self-pay

## 2020-04-09 DIAGNOSIS — H01116 Allergic dermatitis of left eye, unspecified eyelid: Secondary | ICD-10-CM

## 2020-04-09 DIAGNOSIS — Z8669 Personal history of other diseases of the nervous system and sense organs: Secondary | ICD-10-CM

## 2020-04-09 DIAGNOSIS — H4053X2 Glaucoma secondary to other eye disorders, bilateral, moderate stage: Secondary | ICD-10-CM | POA: Diagnosis not present

## 2020-04-09 DIAGNOSIS — H4421 Degenerative myopia, right eye: Secondary | ICD-10-CM | POA: Diagnosis not present

## 2020-04-09 NOTE — Progress Notes (Signed)
04/09/2020     CHIEF COMPLAINT Patient presents for Retina Follow Up   HISTORY OF PRESENT ILLNESS: [name] is a 54 y.o. female who presents to the clinic today for:   HPI    Retina Follow Up    Patient presents with  Other.  In left eye.  This started 4 months ago.  Severity is mild.  Duration of 4 months.  Since onset it is stable.          Comments    4 Month F/U OS  Pt reports stable VA OU. Pt denies ocular pain, flashes, or floaters OU.       Last edited by Rockie Neighbours, [place] on 04/09/2020  8:04 AM. (History)      Referring physician: Biagio Borg, MD [place],  [place] [address]  HISTORICAL INFORMATION:   Selected notes from the MEDICAL RECORD NUMBER    Lab Results  Component Value Date   HGBA1C 5.8 07/01/2019     CURRENT MEDICATIONS: Current Outpatient Medications (Ophthalmic Drugs)  Medication Sig  . brimonidine-timolol (COMBIGAN) 0.2-0.5 % ophthalmic solution Place 1 drop into both eyes every 12 (twelve) hours.  . timolol (TIMOPTIC) 0.5 % ophthalmic solution 1 drop 2 (two) times daily.  . brimonidine (ALPHAGAN) 0.2 % ophthalmic solution 1 drop daily.  . Timolol Maleate 0.5 % (DAILY) SOLN timolol maleate 0.5 % soln   No current facility-administered medications for this visit. (Ophthalmic Drugs)   Current Outpatient Medications (Other)  Medication Sig  . aspirin 81 MG EC tablet Take 1 tablet (81 mg total) by mouth daily. Swallow whole.  . cholecalciferol (VITAMIN D) 1000 UNITS tablet Take 1,000 Units by mouth daily.  Marland Kitchen ibuprofen (ADVIL,MOTRIN) 200 MG tablet Take 400 mg by mouth every 6 (six) hours as needed for fever, headache or mild pain.  . simvastatin (ZOCOR) 40 MG tablet Take 1 tablet (40 mg total) by mouth daily.   No current facility-administered medications for this visit. (Other)      REVIEW OF SYSTEMS:    ALLERGIES Allergies  Allergen Reactions  . Crestor [Rosuvastatin Calcium] Other (See Comments)     Myalgias   . Lipitor [Atorvastatin] Other (See Comments)    myalgia  . Lovastatin Other (See Comments)    myalgias  . Statins Nausea And Vomiting  . Topamax Other (See Comments)    dizziness    PAST MEDICAL HISTORY Past Medical History:  Diagnosis Date  . ANXIETY 03/27/2010  . BACK PAIN, CHRONIC 03/27/2010  . Classic migraine with aura 05/30/2015  . COMMON MIGRAINE 03/27/2010  . [place] DISEASE, CERVICAL 03/27/2010  . FATIGUE 03/27/2010  . GERD 03/27/2010  . Glaucoma    bilateral  . HYPERLIPIDEMIA 03/27/2010  . Multiple sclerosis ([place]) 10/23/2010   unconfirmed, but suspected  . Myocardial infarction [facility]    "old"  . Osteoporosis 09/24/2012  . Unspecified vitamin D deficiency 10/23/2010   Past Surgical History:  Procedure Laterality Date  . ABDOMINAL HYSTERECTOMY    . LEFT HEART CATHETERIZATION WITH CORONARY ANGIOGRAM N/A 03/31/2014   Procedure: LEFT HEART CATHETERIZATION WITH CORONARY ANGIOGRAM;  Surgeon: Pixie Casino, MD;  Location: [facility] CATH LAB;  Service: Cardiovascular;  Laterality: N/A;  . Neg stress test  2007  . s/p eye surgury    . s/p knee surgury      FAMILY HISTORY Family History  Problem Relation Age of Onset  . Heart disease Father   . Diabetes Father   . Hyperlipidemia  Father   . Hypertension Father   . Colon polyps Father   . Cancer Mother        breast  . Hyperlipidemia Mother   . Colon polyps Mother   . Diabetes Other   . Arthritis Other   . Dementia Other   . Cancer Other        2 uncles with lung cancer  . Heart disease Other        2 uncles with heart disease  . Lupus Maternal Aunt   . Lupus Cousin        mother and father's side  . Colon cancer Neg Hx   . Esophageal cancer Neg Hx   . Stomach cancer Neg Hx   . Rectal cancer Neg Hx     SOCIAL HISTORY Social History   Tobacco Use  . Smoking status: Never Smoker  . Smokeless tobacco: Never Used  Vaping Use  . Vaping Use: Never used  Substance Use Topics  . Alcohol use: No     Alcohol/week: 0.0 standard drinks  . Drug use: No         OPHTHALMIC EXAM:  Base Eye Exam    Visual Acuity (ETDRS)      Right Left   Dist [place] 20/200    Dist cc  20/20 -2   Dist ph [place] NI    Correction: Glasses       Tonometry (Tonopen, 8:10 AM)      Right Left   Pressure 11 13       Pupils      Dark Light Shape React APD   Right 6 6 Round Minimal None   Left 4 3 Round Brisk None       Visual Fields (Counting fingers)      Left Right   Restrictions Total superior temporal, superior nasal deficiencies Total inferior temporal deficiency       Extraocular Movement      Right Left    Full Full       Neuro/Psych    Oriented x3: Yes   Mood/Affect: Normal       Dilation    Left eye: 1.0% Mydriacyl, 2.5% Phenylephrine @ 8:10 AM        Slit Lamp and Fundus Exam    External Exam      Right Left   External Normal Normal       Slit Lamp Exam      Right Left   Lids/Lashes Normal Normal   Conjunctiva/Sclera White and quiet White and quiet   Cornea Clear Clear   Anterior Chamber Deep and quiet AC deep, no emulsification of oil   Iris Round and reactive Round and reactive   Lens Aphakia Centered posterior chamber intraocular lens   Anterior Vitreous Clear Silicone oil clear, no emulsification       Fundus Exam      Right Left   Posterior Vitreous Clear vitrectomized Silicone oil clear   Disc Tilted cup, Tilted disc Tilted cup, Tilted disc   C/D Ratio 0.45 0.45   Macula Myopic macular degeneration Myopic macular degeneration   Vessels Normal Normal   Periphery Good chorioretinal scarring 360, no retinal tears or detachment Old, traction retinal detachment at an anterior to the equator inferonasal, stable over years          IMAGING AND PROCEDURES  Imaging and Procedures for 04/09/20  OCT, Retina - OU - Both Eyes       Right Eye Quality  was poor. Scan locations included subfoveal. Progression has been stable. Findings include abnormal foveal contour,  central retinal atrophy, outer retinal atrophy, subretinal scarring, myopic contour, no IRF, no SRF.   Left Eye Quality was good. Scan locations included subfoveal. Central Foveal Thickness: 275. Progression has been stable. Findings include abnormal foveal contour, outer retinal atrophy, central retinal atrophy, myopic contour.   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                ASSESSMENT/PLAN:  No problem-specific Assessment & Plan notes found for this encounter.      ICD-10-CM   1. Right degenerative progressive high myopia  H44.21 OCT, Retina - OU - Both Eyes  2. Contact or allergic dermatitis of left eyelid  H01.116   3. History of retinal detachment  Z86.69   4. Secondary glaucoma due to combination mechanisms, bilateral, moderate stage  H40.53X2     1.  Glaucoma, with high myopia, will continue topical medications OU  2.  Patient is progressive high myopia, with associated with prior retinal detachment in the left eye.  She continues now with a 3-year interval status post vitrectomy, resection of traction retinal detachment inferiorly.  Silicone oil in place with excellent 20/20 vision.  Patient has continued on oral vitamin B complex, as as experience is shown that without vitamin B complex supplementation, idiopathic vision loss can occur with long-term silicone oil in place.  Localized traction inferior detachment anterior to the equator is stable over time.  This does prompt consideration of continued use of silicone oil in the left eye until or unless complications were to develop  3.  Patient says importance of continued resting and sleeping on either side  Ophthalmic Meds Ordered this visit:  No orders of the defined types were placed in this encounter.      Return in about 6 months (around 10/10/2020) for DILATE OU, COLOR FP.  There are no Patient Instructions on file for  this visit.   Explained the diagnoses, plan, and follow up with the patient and they expressed understanding.  Patient expressed understanding of the importance of proper follow up care.   Clent Demark Dondrell Loudermilk M.D. Diseases & Surgery of the Retina and Vitreous Retina & Diabetic [place] 04/09/20     Abbreviations: M myopia (nearsighted); A astigmatism; H hyperopia (farsighted); P presbyopia; Mrx spectacle prescription;  CTL contact lenses; OD right eye; OS left eye; OU both eyes  XT exotropia; ET esotropia; PEK punctate epithelial keratitis; PEE punctate epithelial erosions; DES dry eye syndrome; MGD meibomian gland dysfunction; ATs artificial tears; PFAT's preservative free artificial tears; [place] nuclear sclerotic cataract; PSC posterior subcapsular cataract; ERM epi-retinal membrane; PVD posterior vitreous detachment; RD retinal detachment; DM diabetes mellitus; DR diabetic retinopathy; NPDR non-proliferative diabetic retinopathy; PDR proliferative diabetic retinopathy; CSME clinically significant macular edema; DME diabetic macular edema; dbh dot blot hemorrhages; CWS cotton wool spot; POAG primary open angle glaucoma; C/D cup-to-disc ratio; HVF humphrey visual field; GVF goldmann visual field; OCT optical coherence tomography; IOP intraocular pressure; BRVO Branch retinal vein occlusion; CRVO central retinal vein occlusion; CRAO central retinal artery occlusion; BRAO branch retinal artery occlusion; RT retinal tear; SB scleral buckle; PPV pars plana vitrectomy; VH Vitreous hemorrhage; PRP panretinal laser photocoagulation; IVK intravitreal kenalog; VMT vitreomacular traction; MH Macular hole;  NVD neovascularization of the disc; NVE neovascularization elsewhere; AREDS age related eye disease study; ARMD age related macular degeneration; POAG primary open  angle glaucoma; EBMD epithelial/anterior basement membrane dystrophy; ACIOL anterior chamber intraocular lens; IOL intraocular lens; PCIOL posterior  chamber intraocular lens; Phaco/IOL phacoemulsification with intraocular lens placement; [place] photorefractive keratectomy; LASIK laser assisted in situ keratomileusis; HTN hypertension; DM diabetes mellitus; COPD chronic obstructive pulmonary disease

## 2020-05-17 ENCOUNTER — Ambulatory Visit (INDEPENDENT_AMBULATORY_CARE_PROVIDER_SITE_OTHER): Payer: PRIVATE HEALTH INSURANCE | Admitting: Internal Medicine

## 2020-05-17 ENCOUNTER — Other Ambulatory Visit: Payer: Self-pay

## 2020-05-17 ENCOUNTER — Encounter: Payer: Self-pay | Admitting: Internal Medicine

## 2020-05-17 VITALS — BP 115/73 | HR 64 | Temp 96.8°F | Ht 64.0 in | Wt 230.0 lb

## 2020-05-17 DIAGNOSIS — I2584 Coronary atherosclerosis due to calcified coronary lesion: Secondary | ICD-10-CM

## 2020-05-17 DIAGNOSIS — R002 Palpitations: Secondary | ICD-10-CM

## 2020-05-17 DIAGNOSIS — E782 Mixed hyperlipidemia: Secondary | ICD-10-CM | POA: Diagnosis not present

## 2020-05-17 DIAGNOSIS — I2510 Atherosclerotic heart disease of native coronary artery without angina pectoris: Secondary | ICD-10-CM | POA: Diagnosis not present

## 2020-05-17 LAB — LIPID PANEL
Chol/HDL Ratio: 3.6 ratio (ref 0.0–4.4)
Cholesterol, Total: 193 mg/dL (ref 100–199)
HDL: 53 mg/dL (ref >39)
LDL Chol Calc (NIH): 124 mg/dL — ABNORMAL HIGH (ref 0–99)
Triglycerides: 87 mg/dL (ref 0–149)
VLDL Cholesterol Cal: 16 mg/dL (ref 5–40)

## 2020-05-17 NOTE — Patient Instructions (Signed)
Medication Instructions:  No changes *If you need a refill on your cardiac medications before your next appointment, please call your pharmacy*   Lab Work: Lipids- fasting  If you have labs (blood work) drawn today and your tests are completely normal, you will receive your results only by: Marland Kitchen MyChart Message (if you have MyChart) OR . A paper copy in the mail If you have any lab test that is abnormal or we need to change your treatment, we will call you to review the results.   Testing/Procedures: Not needed   Follow-Up: At [facility], you and your health needs are our priority.  As part of our continuing mission to provide you with exceptional heart care, we have created designated Provider Care Teams.  These Care Teams include your primary Cardiologist (physician) and Advanced Practice Providers (APPs -  Physician Assistants and Nurse Practitioners) who all work together to provide you with the care you need, when you need it.    Your next appointment:   12 month(s)  The format for your next appointment:   In Person  Provider:   K. Mali Hilty, MD

## 2020-05-17 NOTE — Progress Notes (Signed)
OFFICE NOTE  Chief Complaint:  No complaints  Primary Care Physician: Biagio Borg, MD  HPI:  [name] is a pleasant 54 year old female who has been suffering with symptoms of chest pain and palpitations for quite some time. Apparently in 2008 she had an episode of chest pain and was admitted to the hospital. She was treated and ruled out and continued to have very intermittent episodes of discomfort over the years. Recently she had some worsening symptoms including chest discomfort and feeling of palpitations. This is followed by significant fatigue and lack of energy for several days after the palpitations have resolved. It is associated with heaviness in the chest that is behind the sternum. It does not radiate to the arm or to the jaw. Sometimes the symptoms are worse when she is exerting herself and improve at rest. Other times they occur at rest. She had recently seen Dr. Terrence Dupont, for a cardiac opinion. He ordered a stress test which was performed at [facility]. 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 No evidence for inducible ischemia. Normal function with an EF of 66%".  This was interpreted by a Development worker, community.  Apparently she received a call saying her stress test was negative and she was told to followup with him in the office in 3 weeks. When she came back she was allegedly told that her stress test showed a prior heart attack. There does however appear to be conflicting data. Based on this she is seeking another opinion, especially in light of recurrent symptoms. She does report that the palpitation episodes are actually fairly infrequent. She says she's had about 3 episodes of this year and perhaps 2 or 3 episodes last year.  [name] returns today for follow-up. After undergoing noninvasive testing which was equivocal,  there was still some concern for possible coronary ischemia. She underwent definitive cardiac catheterization by myself which demonstrated normal coronary arteries. Based on these findings I'm not suspicious that her chest pain is cardiac. Additionally, she has few risk factors for variant angina or small vessel disease. She does have migraine headaches which could put her at risk for coronary spasm. Treatments for possible coronary spasm include beta blockers, calcium channel blockers or nitrates. She seems to have had problems with low blood pressure in the past and I did not think will tolerate these medicines. She was started on low-dose propranolol for migraines and she said she only took 1 pill and felt horrible with it. Therefore, I do not feel she'll tolerate a beta blocker. Her discomfort in the chest is not lifestyle or activity limiting.  01/22/2016  [name] was seen today in the office. She was sent back for follow-up due to recent ER visit where she was shown to have artery artery calcification on her CT however her cardiac catheterization 2015 showed angiographically normal coronaries. 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 Recently she had reassessment of her cholesterol by her primary care provider. She had been intolerant of statin therapy, in fact had not tolerated both Lipitor and lovastatin in the past. Her total cholesterol was 305, triglycerides 61, HDL 51 and LDL 241. 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 There is a history of coronary disease in her family. She has early  coronary artery calcifications at age 41 which is considered premature coronary artery disease although she did not have significant obstruction she will need aggressive lipid therapy. Recently she was started on Crestor 20 mg by primary care provider but had side effects from this which went away  after being off of it for a week. She was then switched to simvastatin 20 mg which so far she is tolerating. I did discuss the lipid lowering properties of simvastatin the fact that she needs more aggressive lowering of LDL cholesterol with a goal LDL of at least 70 if not 50 based on new guidelines, which she will never reached on statin therapy alone. Currently she is on max tolerated statin therapy. I like to add said he had 10 mg to her regimen. We'll also refer her to our lipid clinic as she is a good candidate for the PCSK9 inhibitor Repatha. Her calculated DUTCH score is 6 which is suggestive of FH. She also reports some daytime fatigue, somnolence and difficulty sleeping at night. She's been noted to awaken suddenly with gasping concerning for possible sleep apnea.  03/24/2016  [name] is seen back today in the office. She's had a marked improved prove minute and her cholesterol on Zocor and that he had. Amazingly she's had more than a 50% reduction was suggested there may been a dietary component as well. Although it looks like she may have FH, her cholesterol is fairly well-controlled on her current regimen and therefore do not believe we need to pursue Repatha at this time. Recently she had her sleep study interpreted which indicated no evidence of obstructive sleep apnea. This is reassuring. She continues to exercise but is not seen a major weight reduction. Of encouraged her to keep that up because she is making some progress with regards to her health without question.  11/13/2016  [name] returns today for follow-up. Overall she seems to be doing well. She denies any chest pain or worsening shortness of breath. She is only on monotherapy simvastatin. What tried her on that area but she reported intolerance to that. She is due for repeat lipid profile. Unfortunately this been re-gain of weight from 205 up to 213 pounds. She says she is to start working at that heart her with more exercise  and dietary changes. She is also complaining of some left neck and left arm pain which is getting somewhat better but it's difficult for her to move her neck to the left.  05/17/2020  Mr. Morales is seen today in follow-up.  She continues to do well.  She denies any chest pain or worsening shortness of breath.  She had no coronary disease by heart catheterization performed in 2015.  She was noted however to have some coronary calcification on CT in 2017.  Weight has continued to go up.  Initially she was around 205, then 213 and now 230 pounds.  She reports less activity.  She says that she does get short of breath walking up stairs and doing some activities which I think is weight related.  She not had recent labs since October at which time her lipids were still elevated with total cholesterol of 211, HDL 61, LDL 133 and triglycerides 85.    PMHx:  Past Medical History:  Diagnosis Date   ANXIETY 03/27/2010   BACK PAIN, CHRONIC 03/27/2010   Classic migraine with aura 05/30/2015   COMMON MIGRAINE 03/27/2010   [place] DISEASE, CERVICAL 03/27/2010   FATIGUE 03/27/2010   GERD 03/27/2010  Glaucoma    bilateral   HYPERLIPIDEMIA 03/27/2010   Multiple sclerosis ([place]) 10/23/2010   unconfirmed, but suspected   Myocardial infarction [facility])    "old"   Osteoporosis 09/24/2012   Unspecified vitamin D deficiency 10/23/2010    Past Surgical History:  Procedure Laterality Date   ABDOMINAL HYSTERECTOMY     LEFT HEART CATHETERIZATION WITH CORONARY ANGIOGRAM N/A 03/31/2014   Procedure: LEFT HEART CATHETERIZATION WITH CORONARY ANGIOGRAM;  Surgeon: Pixie Casino, MD;  Location: [facility] CATH LAB;  Service: Cardiovascular;  Laterality: N/A;   Neg stress test  2007   s/p eye surgury     s/p knee surgury      FAMHx:  Family History  Problem Relation Age of Onset   Heart disease Father    Diabetes Father    Hyperlipidemia Father    Hypertension Father    Colon polyps Father    Cancer Mother         breast   Hyperlipidemia Mother    Colon polyps Mother    Diabetes Other    Arthritis Other    Dementia Other    Cancer Other        2 uncles with lung cancer   Heart disease Other        2 uncles with heart disease   Lupus Maternal Aunt    Lupus Cousin        mother and father's side   Colon cancer Neg Hx    Esophageal cancer Neg Hx    Stomach cancer Neg Hx    Rectal cancer Neg Hx     SOCHx:   reports that she has never smoked. She has never used smokeless tobacco. She reports that she does not drink alcohol and does not use drugs.  ALLERGIES:  Allergies  Allergen Reactions   Crestor [Rosuvastatin Calcium] Other (See Comments)    Myalgias    Lipitor [Atorvastatin] Other (See Comments)    myalgia   Lovastatin Other (See Comments)    myalgias   Statins Nausea And Vomiting   Topamax Other (See Comments)    dizziness    ROS: Pertinent items noted in HPI and remainder of comprehensive ROS otherwise negative.  HOME MEDS: Current Outpatient Medications  Medication Sig Dispense Refill   aspirin 81 MG EC tablet Take 1 tablet (81 mg total) by mouth daily. Swallow whole. 30 tablet 12   simvastatin (ZOCOR) 40 MG tablet Take 1 tablet (40 mg total) by mouth daily. 90 tablet 3   No current facility-administered medications for this visit.    LABS/IMAGING: No results found for this or any previous visit (from the past 48 hour(s)). No results found.  VITALS: BP 115/73    Pulse 64    Temp (!) 96.8 F (36 C)    Ht 5\' 4"  (1.626 m)    Wt 230 lb (104.3 kg)    SpO2 97%    BMI 39.48 kg/m   EXAM: General appearance: alert, no distress and morbidly obese Neck: no carotid bruit, no JVD and thyroid not enlarged, symmetric, no tenderness/mass/nodules Lungs: clear to auscultation bilaterally Heart: regular rate and rhythm, S1, S2 normal, no murmur, click, rub or gallop Abdomen: soft, non-tender; bowel sounds normal; no masses,  no organomegaly Extremities:  extremities normal, atraumatic, no cyanosis or edema Pulses: 2+ and symmetric Skin: Skin color, texture, turgor normal. No rashes or lesions Neurologic: Grossly normal Psych: Pleasant  EKG: Normal sinus rhythm at 64, low voltage QRS-personally reviewed  ASSESSMENT: 1.  History of chest pain and palpitations - normal coronary arteries by catheterization by myself (2015) 2. Coronary artery calcification on CT scan (2017) 3. Possible history of multiple sclerosis 4. Mixed dyslipidemia - improved on Zocor  PLAN: 1.   [name] reports no recurrent chest pain or palpitations.  She was noted to have coronary artery calcification on the CT scan but no obstructive coronary disease by catheterization in 2015.  There is a mixed dyslipidemia and she remains above target.  She was previously on ezetimibe and Zocor.  It is not clear why she is no longer on the ezetimibe.  We may need to restart that.  I would like for her to have her work on diet and weight loss.  Follow-up annually or sooner as necessary.  Pixie Casino, MD, [facility], [place] Director of the Advanced Lipid Disorders &  Cardiovascular Risk Reduction Clinic Diplomate of the American Board of Clinical Lipidology Attending Cardiologist  Direct Dial: [phone]   Fax: [phone]  Website:  [email or web address] 05/17/2020, 9:26 AM

## 2020-05-21 ENCOUNTER — Other Ambulatory Visit: Payer: Self-pay | Admitting: *Deleted

## 2020-05-21 DIAGNOSIS — E782 Mixed hyperlipidemia: Secondary | ICD-10-CM

## 2020-05-21 MED ORDER — EZETIMIBE-SIMVASTATIN 10-40 MG PO TABS
1.0000 | ORAL_TABLET | Freq: Every day | ORAL | 3 refills | Status: DC
Start: 2020-05-21 — End: 2021-05-23

## 2020-06-18 DIAGNOSIS — Z23 Encounter for immunization: Secondary | ICD-10-CM | POA: Diagnosis not present

## 2020-07-23 ENCOUNTER — Other Ambulatory Visit: Payer: Self-pay | Admitting: *Deleted

## 2020-07-23 DIAGNOSIS — E782 Mixed hyperlipidemia: Secondary | ICD-10-CM

## 2020-07-26 ENCOUNTER — Telehealth: Payer: Self-pay | Admitting: Internal Medicine

## 2020-07-26 NOTE — Telephone Encounter (Signed)
Patient wondering if Dr. Jenny Reichmann could write a medical excuse letter for jury duty. Patient # [phone]

## 2020-07-26 NOTE — Telephone Encounter (Signed)
Sent to Dr. John. 

## 2020-07-26 NOTE — Telephone Encounter (Signed)
[place] for jury duty excuse- to [employer]

## 2020-07-30 NOTE — Telephone Encounter (Signed)
Patient calling back to check on status and also wondering if it can be put on mychart so she can print it out through there.

## 2020-08-06 ENCOUNTER — Encounter: Payer: Self-pay | Admitting: Internal Medicine

## 2020-08-06 NOTE — Telephone Encounter (Signed)
  Follow up message  Patient calling for status of letter. Patient needing letter as soon as possible to provide [place] # [phone]

## 2020-08-15 ENCOUNTER — Other Ambulatory Visit: Payer: Self-pay | Admitting: Internal Medicine

## 2020-08-15 NOTE — Telephone Encounter (Signed)
Sorry no refil needed

## 2020-08-15 NOTE — Telephone Encounter (Signed)
Please refill as per office routine med refill policy (all routine meds refilled for 3 mo or monthly per pt preference up to one year from last visit, then month to month grace period for 3 mo, then further med refills will have to be denied)  

## 2020-10-11 ENCOUNTER — Ambulatory Visit (INDEPENDENT_AMBULATORY_CARE_PROVIDER_SITE_OTHER): Payer: PRIVATE HEALTH INSURANCE | Admitting: Ophthalmology

## 2020-10-11 ENCOUNTER — Encounter (INDEPENDENT_AMBULATORY_CARE_PROVIDER_SITE_OTHER): Payer: Self-pay | Admitting: Ophthalmology

## 2020-10-11 ENCOUNTER — Other Ambulatory Visit: Payer: Self-pay

## 2020-10-11 DIAGNOSIS — H3322 Serous retinal detachment, left eye: Secondary | ICD-10-CM | POA: Insufficient documentation

## 2020-10-11 DIAGNOSIS — H4053X2 Glaucoma secondary to other eye disorders, bilateral, moderate stage: Secondary | ICD-10-CM

## 2020-10-11 DIAGNOSIS — H4421 Degenerative myopia, right eye: Secondary | ICD-10-CM

## 2020-10-11 DIAGNOSIS — Z8669 Personal history of other diseases of the nervous system and sense organs: Secondary | ICD-10-CM | POA: Diagnosis not present

## 2020-10-11 NOTE — Assessment & Plan Note (Signed)
Medication as tolerated with excellent effect protecting optic nerve perfusion in this patient with high myopia

## 2020-10-11 NOTE — Assessment & Plan Note (Addendum)
We will continue to monitor for silicone oil complications such as emulsification, progression of secondary glaucoma, oil migration.  Currently there are no complications.  Patient is excellently suited and functioning with silicone oil with excellent acuity of 20/20 20/25 left eye.  We will continue to monitor.  Should complications arise, we might be forced to remove silicone oil intake on the potential for redetachment extending posteriorly.  I do think that this current retinal detachment risk is low but nonetheless real.  Thus we have opted to continue to observe and leave the silicone oil in place.

## 2020-10-11 NOTE — Progress Notes (Signed)
10/11/2020     CHIEF COMPLAINT Patient presents for Retina Follow Up (6 MO FU OU///Pt reports stable vision OU. Pt denies any new F/F, pain, or pressure OU. )   HISTORY OF PRESENT ILLNESS: [name] is a 55 y.o. female who presents to the clinic today for:   HPI    Retina Follow Up    Patient presents with  Other.  In both eyes.  This started 6 months ago.  Duration of 6 months.  Since onset it is stable. Additional comments: 6 MO FU OU   Pt reports stable vision OU. Pt denies any new F/F, pain, or pressure OU.        Last edited by Nichola Sizer D on 10/11/2020  8:07 AM. (History)      Referring physician: Biagio Borg, MD [place],  [place] [address]  HISTORICAL INFORMATION:   Selected notes from the MEDICAL RECORD NUMBER    Lab Results  Component Value Date   HGBA1C 5.8 07/01/2019     CURRENT MEDICATIONS: Current Outpatient Medications (Ophthalmic Drugs)  Medication Sig  . brimonidine (ALPHAGAN P) 0.1 % SOLN Place 1 drop into both eyes 2 (two) times daily.  . timolol (BETIMOL) 0.25 % ophthalmic solution Place 1 drop into both eyes 2 (two) times daily.   No current facility-administered medications for this visit. (Ophthalmic Drugs)   Current Outpatient Medications (Other)  Medication Sig  . aspirin 81 MG EC tablet Take 1 tablet (81 mg total) by mouth daily. Swallow whole.  . ezetimibe-simvastatin (VYTORIN) 10-40 MG tablet Take 1 tablet by mouth daily.   No current facility-administered medications for this visit. (Other)      REVIEW OF SYSTEMS:    ALLERGIES Allergies  Allergen Reactions  . Crestor [Rosuvastatin Calcium] Other (See Comments)    Myalgias   . Lipitor [Atorvastatin] Other (See Comments)    myalgia  . Lovastatin Other (See Comments)    myalgias  . Statins Nausea And Vomiting  . Topamax Other (See Comments)    dizziness    PAST MEDICAL HISTORY Past Medical History:  Diagnosis Date  . ANXIETY 03/27/2010  .  BACK PAIN, CHRONIC 03/27/2010  . Classic migraine with aura 05/30/2015  . COMMON MIGRAINE 03/27/2010  . [place] DISEASE, CERVICAL 03/27/2010  . FATIGUE 03/27/2010  . GERD 03/27/2010  . Glaucoma    bilateral  . HYPERLIPIDEMIA 03/27/2010  . Multiple sclerosis ([place]) 10/23/2010   unconfirmed, but suspected  . Myocardial infarction [facility])    "old"  . Osteoporosis 09/24/2012  . Unspecified vitamin D deficiency 10/23/2010   Past Surgical History:  Procedure Laterality Date  . ABDOMINAL HYSTERECTOMY    . LEFT HEART CATHETERIZATION WITH CORONARY ANGIOGRAM N/A 03/31/2014   Procedure: LEFT HEART CATHETERIZATION WITH CORONARY ANGIOGRAM;  Surgeon: Pixie Casino, MD;  Location: [facility] CATH LAB;  Service: Cardiovascular;  Laterality: N/A;  . Neg stress test  2007  . s/p eye surgury    . s/p knee surgury      FAMILY HISTORY Family History  Problem Relation Age of Onset  . Heart disease Father   . Diabetes Father   . Hyperlipidemia Father   . Hypertension Father   . Colon polyps Father   . Cancer Mother        breast  . Hyperlipidemia Mother   . Colon polyps Mother   . Diabetes Other   . Arthritis Other   . Dementia Other   . Cancer Other  2 uncles with lung cancer  . Heart disease Other        2 uncles with heart disease  . Lupus Maternal Aunt   . Lupus Cousin        mother and father's side  . Colon cancer Neg Hx   . Esophageal cancer Neg Hx   . Stomach cancer Neg Hx   . Rectal cancer Neg Hx     SOCIAL HISTORY Social History   Tobacco Use  . Smoking status: Never Smoker  . Smokeless tobacco: Never Used  Vaping Use  . Vaping Use: Never used  Substance Use Topics  . Alcohol use: No    Alcohol/week: 0.0 standard drinks  . Drug use: No         OPHTHALMIC EXAM:  Base Eye Exam    Visual Acuity (ETDRS)      Right Left   Dist [place] 20/200    Dist cc  20/25 -2   Dist ph [place] 20/100 -1        Tonometry (Tonopen, 8:14 AM)      Right Left   Pressure 18 15       Pupils       Dark Light Shape React APD   Right 6 6 Round Minimal None   Left 4 3 Round Brisk None       Visual Fields (Counting fingers)      Left Right   Restrictions Total superior temporal, superior nasal deficiencies Total inferior temporal deficiency       Extraocular Movement      Right Left    Full Full       Neuro/Psych    Oriented x3: Yes   Mood/Affect: Normal       Dilation    Both eyes: 1.0% Mydriacyl, 2.5% Phenylephrine @ 8:14 AM        Slit Lamp and Fundus Exam    External Exam      Right Left   External Normal Normal       Slit Lamp Exam      Right Left   Lids/Lashes Normal Normal   Conjunctiva/Sclera White and quiet White and quiet   Cornea Clear Clear   Anterior Chamber Deep and quiet AC deep, no emulsification of oil   Iris Round and reactive Round and reactive   Lens Aphakia Centered posterior chamber intraocular lens   Anterior Vitreous Clear Silicone oil clear, no emulsification       Fundus Exam      Right Left   Posterior Vitreous Clear vitrectomized Silicone oil clear   Disc Tilted cup, Tilted disc Tilted cup, Tilted disc   C/D Ratio 0.45 0.45   Macula Myopic macular degeneration Myopic macular degeneration   Vessels Normal Normal   Periphery Good chorioretinal scarring 360, no retinal tears or detachment Old, traction retinal detachment at an anterior to the equator inferonasal, stable over years          IMAGING AND PROCEDURES  Imaging and Procedures for 10/11/20  Color Fundus Photography Optos - OU - Both Eyes       Right Eye Progression has been stable. Disc findings include normal observations. Macula : geographic atrophy. Vessels : normal observations. Periphery : normal observations.   Left Eye Progression has been stable. Macula : geographic atrophy.   Notes OS with clear silicone, retina attached.  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  OD, acuity limited by geographic atrophy of myopic macular degeneration.  No active disease.  Peripheral chorioretinal scarring and laser retinopexy, retina attached.                ASSESSMENT/PLAN:  Secondary glaucoma due to combination mechanisms, bilateral, moderate stage Medication as tolerated with excellent effect protecting optic nerve perfusion in this patient with high myopia  Partial retinal detachment, left We will continue to monitor for silicone oil complications such as emulsification, progression of secondary glaucoma, oil migration.  Currently there are no complications.  Patient is excellently suited and functioning with silicone oil with excellent acuity of 20/20 20/25 left eye.  We will continue to monitor.  Should complications arise, we might be forced to remove silicone oil intake on the potential for redetachment extending posteriorly.  I do think that this current retinal detachment risk is low but nonetheless real.  Thus we have opted to continue to observe and leave the silicone oil in place.      ICD-10-CM   1. Right degenerative progressive high myopia  H44.21 Color Fundus Photography Optos - OU - Both Eyes  2. History of retinal detachment  Z86.69   3. Secondary glaucoma due to combination mechanisms, bilateral, moderate stage  H40.53X2   4. Partial retinal detachment, left  H33.22     1.  Observe OS, excellent visual acuity and ambulatory function.  2.  Continue on topical drop therapy to maintain lower intraocular pressure in this patient with pathologic high myopia and risk for vision loss due to optic nerve fusion demise from moderate high elevated pressures.  3.  Ophthalmic Meds Ordered this visit:  No orders of the defined types were placed in this encounter.      Return in about 6 months (around 04/10/2021) for DILATE OU, COLOR FP, OCT.  There are no Patient Instructions on file for this visit.   Explained  the diagnoses, plan, and follow up with the patient and they expressed understanding.  Patient expressed understanding of the importance of proper follow up care.   Clent Demark Daniesha Driver M.D. Diseases & Surgery of the Retina and Vitreous Retina & Diabetic [place] 10/11/20     Abbreviations: M myopia (nearsighted); A astigmatism; H hyperopia (farsighted); P presbyopia; Mrx spectacle prescription;  CTL contact lenses; OD right eye; OS left eye; OU both eyes  XT exotropia; ET esotropia; PEK punctate epithelial keratitis; PEE punctate epithelial erosions; DES dry eye syndrome; MGD meibomian gland dysfunction; ATs artificial tears; PFAT's preservative free artificial tears; [place] nuclear sclerotic cataract; PSC posterior subcapsular cataract; ERM epi-retinal membrane; PVD posterior vitreous detachment; RD retinal detachment; DM diabetes mellitus; DR diabetic retinopathy; NPDR non-proliferative diabetic retinopathy; PDR proliferative diabetic retinopathy; CSME clinically significant macular edema; DME diabetic macular edema; dbh dot blot hemorrhages; CWS cotton wool spot; POAG primary open angle glaucoma; C/D cup-to-disc ratio; HVF humphrey visual field; GVF goldmann visual field; OCT optical coherence tomography; IOP intraocular pressure; BRVO Branch retinal vein occlusion; CRVO central retinal vein occlusion; CRAO central retinal artery occlusion; BRAO branch retinal artery occlusion; RT retinal tear; SB scleral buckle; PPV pars plana vitrectomy; VH Vitreous hemorrhage; PRP panretinal laser photocoagulation; IVK intravitreal kenalog; VMT vitreomacular traction; MH Macular hole;  NVD neovascularization of the disc; NVE neovascularization elsewhere; AREDS age related eye disease study; ARMD age related macular degeneration; POAG primary open angle glaucoma; EBMD epithelial/anterior basement membrane dystrophy; ACIOL anterior chamber intraocular lens; IOL intraocular lens; PCIOL posterior chamber intraocular lens;  Phaco/IOL phacoemulsification with intraocular lens placement; [place] photorefractive keratectomy; LASIK laser assisted in situ keratomileusis; HTN hypertension; DM diabetes mellitus; COPD chronic obstructive pulmonary disease

## 2020-11-14 ENCOUNTER — Other Ambulatory Visit: Payer: Self-pay | Admitting: Internal Medicine

## 2021-01-25 ENCOUNTER — Other Ambulatory Visit: Payer: Self-pay

## 2021-01-25 ENCOUNTER — Encounter: Payer: Self-pay | Admitting: Internal Medicine

## 2021-01-25 ENCOUNTER — Ambulatory Visit (INDEPENDENT_AMBULATORY_CARE_PROVIDER_SITE_OTHER): Payer: PRIVATE HEALTH INSURANCE | Admitting: Internal Medicine

## 2021-01-25 ENCOUNTER — Ambulatory Visit (INDEPENDENT_AMBULATORY_CARE_PROVIDER_SITE_OTHER): Payer: PRIVATE HEALTH INSURANCE

## 2021-01-25 VITALS — BP 102/74 | HR 79 | Temp 98.5°F | Ht 64.0 in | Wt 219.0 lb

## 2021-01-25 DIAGNOSIS — F411 Generalized anxiety disorder: Secondary | ICD-10-CM

## 2021-01-25 DIAGNOSIS — Z1159 Encounter for screening for other viral diseases: Secondary | ICD-10-CM

## 2021-01-25 DIAGNOSIS — E559 Vitamin D deficiency, unspecified: Secondary | ICD-10-CM | POA: Diagnosis not present

## 2021-01-25 DIAGNOSIS — M546 Pain in thoracic spine: Secondary | ICD-10-CM

## 2021-01-25 DIAGNOSIS — E538 Deficiency of other specified B group vitamins: Secondary | ICD-10-CM | POA: Diagnosis not present

## 2021-01-25 DIAGNOSIS — Z23 Encounter for immunization: Secondary | ICD-10-CM | POA: Diagnosis not present

## 2021-01-25 DIAGNOSIS — E7800 Pure hypercholesterolemia, unspecified: Secondary | ICD-10-CM | POA: Diagnosis not present

## 2021-01-25 DIAGNOSIS — R079 Chest pain, unspecified: Secondary | ICD-10-CM | POA: Diagnosis not present

## 2021-01-25 DIAGNOSIS — R739 Hyperglycemia, unspecified: Secondary | ICD-10-CM | POA: Diagnosis not present

## 2021-01-25 LAB — CBC WITH DIFFERENTIAL/PLATELET
Basophils Absolute: 0 10*3/uL (ref 0.0–0.1)
Basophils Relative: 0.7 % (ref 0.0–3.0)
Eosinophils Absolute: 0.1 10*3/uL (ref 0.0–0.7)
Eosinophils Relative: 0.9 % (ref 0.0–5.0)
HCT: 40.9 % (ref 36.0–46.0)
Hemoglobin: 13.6 g/dL (ref 12.0–15.0)
Lymphocytes Relative: 41.2 % (ref 12.0–46.0)
Lymphs Abs: 2.5 10*3/uL (ref 0.7–4.0)
MCHC: 33.3 g/dL (ref 30.0–36.0)
MCV: 92.8 fl (ref 78.0–100.0)
Monocytes Absolute: 0.4 10*3/uL (ref 0.1–1.0)
Monocytes Relative: 7.2 % (ref 3.0–12.0)
Neutro Abs: 3 10*3/uL (ref 1.4–7.7)
Neutrophils Relative %: 50 % (ref 43.0–77.0)
Platelets: 268 10*3/uL (ref 150.0–400.0)
RBC: 4.41 Mil/uL (ref 3.87–5.11)
RDW: 14.3 % (ref 11.5–15.5)
WBC: 6 10*3/uL (ref 4.0–10.5)

## 2021-01-25 LAB — URINALYSIS, ROUTINE W REFLEX MICROSCOPIC
Bilirubin Urine: NEGATIVE
Hgb urine dipstick: NEGATIVE
Ketones, ur: 15 — AB
Leukocytes,Ua: NEGATIVE
Nitrite: NEGATIVE
Specific Gravity, Urine: >=1.03 — AB (ref 1.000–1.030)
Total Protein, Urine: NEGATIVE
Urine Glucose: NEGATIVE
Urobilinogen, UA: 0.2 (ref 0.0–1.0)
pH: 5.5 (ref 5.0–8.0)

## 2021-01-25 LAB — BASIC METABOLIC PANEL
BUN: 12 mg/dL (ref 6–23)
CO2: 25 mEq/L (ref 19–32)
Calcium: 9.5 mg/dL (ref 8.4–10.5)
Chloride: 104 mEq/L (ref 96–112)
Creatinine, Ser: 0.58 mg/dL (ref 0.40–1.20)
GFR: 101.8 mL/min (ref >60.00)
Glucose, Bld: 77 mg/dL (ref 70–99)
Potassium: 4 mEq/L (ref 3.5–5.1)
Sodium: 137 mEq/L (ref 135–145)

## 2021-01-25 LAB — HEPATIC FUNCTION PANEL
ALT: 31 U/L (ref 0–35)
AST: 26 U/L (ref 0–37)
Albumin: 4.2 g/dL (ref 3.5–5.2)
Alkaline Phosphatase: 100 U/L (ref 39–117)
Bilirubin, Direct: 0.1 mg/dL (ref 0.0–0.3)
Total Bilirubin: 0.5 mg/dL (ref 0.2–1.2)
Total Protein: 7.5 g/dL (ref 6.0–8.3)

## 2021-01-25 LAB — LIPID PANEL
Cholesterol: 163 mg/dL (ref 0–200)
HDL: 49 mg/dL (ref >39.00)
LDL Cholesterol: 99 mg/dL (ref 0–99)
NonHDL: 114.12
Total CHOL/HDL Ratio: 3
Triglycerides: 74 mg/dL (ref 0.0–149.0)
VLDL: 14.8 mg/dL (ref 0.0–40.0)

## 2021-01-25 LAB — HEMOGLOBIN A1C: Hgb A1c MFr Bld: 5.8 % (ref 4.6–6.5)

## 2021-01-25 LAB — VITAMIN D 25 HYDROXY (VIT D DEFICIENCY, FRACTURES): VITD: 39.82 ng/mL (ref 30.00–100.00)

## 2021-01-25 LAB — VITAMIN B12: Vitamin B-12: 885 pg/mL (ref 211–911)

## 2021-01-25 LAB — TSH: TSH: 0.55 u[IU]/mL (ref 0.35–4.50)

## 2021-01-25 NOTE — Patient Instructions (Signed)
You had the Tdap tetanus shot today  Your EKG was OK today  Please continue all other medications as before, and refills have been done if requested.  Please have the pharmacy call with any other refills you may need.  Please continue your efforts at being more active, low cholesterol diet, and weight control.  You are otherwise up to date with prevention measures today.  Please keep your appointments with your specialists as you may have planned  Please go to the XRAY Department in the first floor for the x-ray testing  Please go to the LAB at the blood drawing area for the tests to be done  You will be contacted by phone if any changes need to be made immediately.  Otherwise, you will receive a letter about your results with an explanation, but please check with MyChart first.  Please remember to sign up for MyChart if you have not done so, as this will be important to you in the future with finding out test results, communicating by private email, and scheduling acute appointments online when needed.  Please make an Appointment to return in 6 months, or sooner if needed

## 2021-01-25 NOTE — Progress Notes (Signed)
Patient ID: [name], female   DOB: Mar 02, 1966, 55 y.o.   MRN: [id number]         Chief Complaint: pain between the shoulder blades       HPI:  [name] is a 55 y.o. female here with c/o 3 days onset sharp pain between the shoulder blades, mild intermittent but concerning, without radiation, and Pt denies chest pain, increased sob or doe, wheezing, orthopnea, PND, increased LE swelling, palpitations, dizziness or syncope.  Nothing really makes better or worse except maybe moving the shoulders and arms but not really sure of that; pain is non pleuritic, nonpositional nonexertional, and has not done any unusual new physical activities or heavy lifting.  Trying to follow a lower cholesterol diet, and on vytorin after sept 2021 elevated LDL.  Tolerating well, no other joint pain or myalgias.  Pt very concerned about heart disease given family hx.  Father died 2 mo ago with griieving, had dementia, heart disease.   Due for Tdap today.  Denies worsening depressive symptoms, suicidal ideation, or panic Wt Readings from Last 3 Encounters:  01/25/21 219 lb (99.3 kg)  05/17/20 230 lb (104.3 kg)  09/12/19 234 lb (106.1 kg)   BP Readings from Last 3 Encounters:  01/25/21 102/74  05/17/20 115/73  03/10/20 112/78         Past Medical History:  Diagnosis Date  . ANXIETY 03/27/2010  . BACK PAIN, CHRONIC 03/27/2010  . Classic migraine with aura 05/30/2015  . COMMON MIGRAINE 03/27/2010  . [place] DISEASE, CERVICAL 03/27/2010  . FATIGUE 03/27/2010  . GERD 03/27/2010  . Glaucoma    bilateral  . HYPERLIPIDEMIA 03/27/2010  . Multiple sclerosis ([place]) 10/23/2010   unconfirmed, but suspected  . Myocardial infarction [facility])    "old"  . Osteoporosis 09/24/2012  . Unspecified vitamin D deficiency 10/23/2010   Past Surgical History:  Procedure Laterality Date  . ABDOMINAL HYSTERECTOMY    . LEFT HEART CATHETERIZATION WITH CORONARY ANGIOGRAM N/A 03/31/2014   Procedure: LEFT HEART CATHETERIZATION WITH CORONARY  ANGIOGRAM;  Surgeon: Pixie Casino, MD;  Location: [facility] CATH LAB;  Service: Cardiovascular;  Laterality: N/A;  . Neg stress test  2007  . s/p eye surgury    . s/p knee surgury      reports that she has never smoked. She has never used smokeless tobacco. She reports that she does not drink alcohol and does not use drugs. family history includes Arthritis in an other family member; Cancer in her mother and another family member; Colon polyps in her father and mother; Dementia in an other family member; Diabetes in her father and another family member; Heart disease in her father and another family member; Hyperlipidemia in her father and mother; Hypertension in her father; Lupus in her cousin and maternal aunt. Allergies  Allergen Reactions  . Crestor [Rosuvastatin Calcium] Other (See Comments)    Myalgias   . Lipitor [Atorvastatin] Other (See Comments)    myalgia  . Lovastatin Other (See Comments)    myalgias  . Statins Nausea And Vomiting  . Topamax Other (See Comments)    dizziness   Current Outpatient Medications on File Prior to Visit  Medication Sig Dispense Refill  . aspirin 81 MG EC tablet Take 1 tablet (81 mg total) by mouth daily. Swallow whole. 30 tablet 12  . brimonidine (ALPHAGAN P) 0.1 % SOLN Place 1 drop into both eyes 2 (two) times daily.    Marland Kitchen ezetimibe-simvastatin (VYTORIN) 10-40 MG tablet Take  1 tablet by mouth daily. 90 tablet 3  . timolol (BETIMOL) 0.25 % ophthalmic solution Place 1 drop into both eyes 2 (two) times daily.     No current facility-administered medications on file prior to visit.        ROS:  All others reviewed and negative.  Objective        PE:  BP 102/74 (BP Location: Left Arm, Patient Position: Sitting, Cuff Size: Large)   Pulse 79   Temp 98.5 F (36.9 C) (Oral)   Ht 5\' 4"  (1.626 m)   Wt 219 lb (99.3 kg)   SpO2 95%   BMI 37.59 kg/m                 Constitutional: Pt appears in NAD               HENT: Head: NCAT.                Right  Ear: External ear normal.                 Left Ear: External ear normal.                Eyes: . Pupils are equal, round, and reactive to light. Conjunctivae and EOM are normal               Nose: without d/c or deformity               Neck: Neck supple. Gross normal ROM               Cardiovascular: Normal rate and regular rhythm.                 Pulmonary/Chest: Effort normal and breath sounds without rales or wheezing. ; pt is non tender msk periscapular               Abd:  Soft, NT, ND, + BS, no organomegaly               Neurological: Pt is alert. At baseline orientation, motor grossly intact               Skin: Skin is warm. No rashes, no other new lesions, LE edema - *none               Psychiatric: Pt behavior is normal without agitation   Micro: none  Cardiac tracings I have personally interpreted today:  ECG - NSR 64  Pertinent Radiological findings (summarize): none   Lab Results  Component Value Date   WBC 6.0 01/25/2021   HGB 13.6 01/25/2021   HCT 40.9 01/25/2021   PLT 268.0 01/25/2021   GLUCOSE 77 01/25/2021   CHOL 163 01/25/2021   TRIG 74.0 01/25/2021   HDL 49.00 01/25/2021   LDLDIRECT 184.9 08/04/2012   LDLCALC 99 01/25/2021   ALT 31 01/25/2021   AST 26 01/25/2021   NA 137 01/25/2021   K 4.0 01/25/2021   CL 104 01/25/2021   CREATININE 0.58 01/25/2021   BUN 12 01/25/2021   CO2 25 01/25/2021   TSH 0.55 01/25/2021   INR 0.99 05/24/2015   HGBA1C 5.8 01/25/2021   Assessment/Plan:  [name] is a 55 y.o. Black or African American [2] female with  has a past medical history of ANXIETY (03/27/2010), BACK PAIN, CHRONIC (03/27/2010), Classic migraine with aura (05/30/2015), COMMON MIGRAINE (03/27/2010), DISC DISEASE, CERVICAL (03/27/2010), FATIGUE (03/27/2010), GERD (03/27/2010), Glaucoma, HYPERLIPIDEMIA (03/27/2010), Multiple sclerosis ([place]) (10/23/2010), Myocardial  infarction [facility]), Osteoporosis (09/24/2012), and Unspecified vitamin D deficiency (10/23/2010).  Thoracic  back pain dw pt, this would be uusual and atypical to present at heart disease; ecg reviewed, for cxr and t spine films as though exam benign I suspect may be msk  Hyperlipidemia Now on vytorin, ok for f/u lipids per pt reqeust, goal ldl at least < 100  Lab Results  Component Value Date   CHOL 163 01/25/2021   HDL 49.00 01/25/2021   LDLCALC 99 01/25/2021   LDLDIRECT 184.9 08/04/2012   TRIG 74.0 01/25/2021   CHOLHDL 3 01/25/2021     Hyperglycemia Lab Results  Component Value Date   HGBA1C 5.8 01/25/2021   Stable, pt to continue current medical treatment  - diet   Anxiety state Mild chronic persistent, pt reassured, declines need for change in tx at this time  Followup: Return in about 6 months (around 07/28/2021).  Cathlean Cower, MD 01/26/2021 10:26 AM [facility]

## 2021-01-26 ENCOUNTER — Encounter: Payer: Self-pay | Admitting: Internal Medicine

## 2021-01-26 NOTE — Assessment & Plan Note (Signed)
Mild chronic persistent, pt reassured, declines need for change in tx at this time

## 2021-01-26 NOTE — Assessment & Plan Note (Signed)
Lab Results  Component Value Date   HGBA1C 5.8 01/25/2021   Stable, pt to continue current medical treatment  - diet

## 2021-01-26 NOTE — Assessment & Plan Note (Signed)
dw pt, this would be uusual and atypical to present at heart disease; ecg reviewed, for cxr and t spine films as though exam benign I suspect may be msk

## 2021-01-26 NOTE — Assessment & Plan Note (Addendum)
Now on vytorin, ok for f/u lipids per pt reqeust, goal ldl at least < 100  Lab Results  Component Value Date   CHOL 163 01/25/2021   HDL 49.00 01/25/2021   LDLCALC 99 01/25/2021   LDLDIRECT 184.9 08/04/2012   TRIG 74.0 01/25/2021   CHOLHDL 3 01/25/2021

## 2021-01-27 ENCOUNTER — Encounter: Payer: Self-pay | Admitting: Internal Medicine

## 2021-01-27 ENCOUNTER — Other Ambulatory Visit: Payer: Self-pay | Admitting: Internal Medicine

## 2021-01-27 DIAGNOSIS — M810 Age-related osteoporosis without current pathological fracture: Secondary | ICD-10-CM

## 2021-01-29 ENCOUNTER — Encounter: Payer: PRIVATE HEALTH INSURANCE | Admitting: Internal Medicine

## 2021-01-30 ENCOUNTER — Telehealth: Payer: Self-pay | Admitting: Internal Medicine

## 2021-01-30 DIAGNOSIS — Z6839 Body mass index (BMI) 39.0-39.9, adult: Secondary | ICD-10-CM | POA: Diagnosis not present

## 2021-01-30 DIAGNOSIS — Z01419 Encounter for gynecological examination (general) (routine) without abnormal findings: Secondary | ICD-10-CM | POA: Diagnosis not present

## 2021-01-30 DIAGNOSIS — Z130 Encounter for screening for diseases of the blood and blood-forming organs and certain disorders involving the immune mechanism: Secondary | ICD-10-CM | POA: Diagnosis not present

## 2021-01-30 DIAGNOSIS — Z1389 Encounter for screening for other disorder: Secondary | ICD-10-CM | POA: Diagnosis not present

## 2021-01-30 DIAGNOSIS — Z6837 Body mass index (BMI) 37.0-37.9, adult: Secondary | ICD-10-CM | POA: Diagnosis not present

## 2021-01-30 NOTE — Telephone Encounter (Signed)
Patient called and was wondering if recent x- ray results could be faxed to [facility].   Fax: [phone]

## 2021-01-30 NOTE — Telephone Encounter (Signed)
[place] with me, please do this for pt

## 2021-01-31 NOTE — Telephone Encounter (Signed)
Papers have been faxed see message below

## 2021-01-31 NOTE — Progress Notes (Signed)
Name: [name]  MRN/ DOB: [id number], Feb 16, 1966    Age/ Sex: 55 y.o., female    PCP: Biagio Borg, MD   Reason for Endocrinology Evaluation: Osteoporosis      Date of Initial Endocrinology Evaluation: 02/01/2021     HPI: Ms. [name] is a 55 y.o. female with a past medical history of Dyslipidemia, Hx of infarct on stress test ,  and Osteoporosis . The patient presented for initial endocrinology clinic visit on 02/01/2021 for consultative assistance with her Osteoporosis .   Pt was diagnosed with osteoporosis: 2013  with a Tscore of -2.6 at the femoral neck   Menarche at age : 18 Menopausal at age : S/P hysterectomy  Around age late 25's Fracture Hx: Compression spinal vertebrae on Xray  Hx of HRT: no  FH of osteoporosis or hip fracture: no Prior Hx of anti-estrogenic therapy : no Prior Hx of anti-resorptive therapy : no    She is on Vitamin D 3000 iu daily  Calcium 1200 mg daily    She has an upcoming appointment with [facility]    HISTORY:  Past Medical History:  Past Medical History:  Diagnosis Date  . ANXIETY 03/27/2010  . BACK PAIN, CHRONIC 03/27/2010  . Classic migraine with aura 05/30/2015  . COMMON MIGRAINE 03/27/2010  . [place] DISEASE, CERVICAL 03/27/2010  . FATIGUE 03/27/2010  . GERD 03/27/2010  . Glaucoma    bilateral  . HYPERLIPIDEMIA 03/27/2010  . Multiple sclerosis ([place]) 10/23/2010   unconfirmed, but suspected  . Myocardial infarction [facility])    "old"  . Osteoporosis 09/24/2012  . Unspecified vitamin D deficiency 10/23/2010   Past Surgical History:  Past Surgical History:  Procedure Laterality Date  . ABDOMINAL HYSTERECTOMY    . LEFT HEART CATHETERIZATION WITH CORONARY ANGIOGRAM N/A 03/31/2014   Procedure: LEFT HEART CATHETERIZATION WITH CORONARY ANGIOGRAM;  Surgeon: Pixie Casino, MD;  Location: [facility] CATH LAB;  Service: Cardiovascular;  Laterality: N/A;  . Neg stress test  2007  . s/p eye surgury    . s/p knee surgury        Social  History:  reports that she has never smoked. She has never used smokeless tobacco. She reports that she does not drink alcohol and does not use drugs.  Family History: family history includes Arthritis in an other family member; Cancer in her mother and another family member; Colon polyps in her father and mother; Dementia in an other family member; Diabetes in her father and another family member; Heart disease in her father and another family member; Hyperlipidemia in her father and mother; Hypertension in her father; Lupus in her cousin and maternal aunt.   HOME MEDICATIONS: Allergies as of 02/01/2021      Reactions   Crestor [rosuvastatin Calcium] Other (See Comments)   Myalgias    Lipitor [atorvastatin] Other (See Comments)   myalgia   Lovastatin Other (See Comments)   myalgias   Statins Nausea And Vomiting   Topamax Other (See Comments)   dizziness      Medication List       Accurate as of Feb 01, 2021  8:41 AM. If you have any questions, ask your nurse or doctor.        aspirin 81 MG EC tablet Take 1 tablet (81 mg total) by mouth daily. Swallow whole.   brimonidine 0.1 % Soln Commonly known as: ALPHAGAN P Place 1 drop into both eyes 2 (two) times daily.   ezetimibe-simvastatin  10-40 MG tablet Commonly known as: Vytorin Take 1 tablet by mouth daily.   timolol 0.25 % ophthalmic solution Commonly known as: BETIMOL Place 1 drop into both eyes 2 (two) times daily.         REVIEW OF SYSTEMS: A comprehensive ROS was conducted with the patient and is negative except as per HPI and below:  Review of Systems  Gastrointestinal: Negative for diarrhea, heartburn and nausea.  Musculoskeletal: Positive for joint pain.  Neurological: Negative for tingling.       OBJECTIVE:  VS: BP 110/72   Pulse 80   Ht 5\' 4"  (1.626 m)   Wt 225 lb 2 oz (102.1 kg)   SpO2 98%   BMI 38.64 kg/m    Wt Readings from Last 3 Encounters:  02/01/21 225 lb 2 oz (102.1 kg)  01/25/21 219 lb  (99.3 kg)  05/17/20 230 lb (104.3 kg)     EXAM: General: Pt appears well and is in NAD  Neck: General: Supple without adenopathy. Thyroid: Thyroid size normal.  No goiter or nodules appreciated. No thyroid bruit.  Lungs: Clear with good BS bilat with no rales, rhonchi, or wheezes  Heart: Auscultation: RRR.  Abdomen: Normoactive bowel sounds, soft, nontender, without masses or organomegaly palpable  Extremities:  BL LE: No pretibial edema normal ROM and strength.  Skin: Hair: Texture and amount normal with gender appropriate distribution Skin Inspection: No rashes Skin Palpation: Skin temperature, texture, and thickness normal to palpation  Neuro: Cranial nerves: II - XII grossly intact  Motor: Normal strength throughout DTRs: 2+ and symmetric in UE without delay in relaxation phase  Mental Status: Judgment, insight: Intact Memory: Intact for recent and remote events Mood and affect: No depression, anxiety, or agitation     DATA REVIEWED:      Results for BURNETT, LIEBER" (MRN [id number]) as of 01/31/2021 20:44  Ref. Range 01/25/2021 11:07  Sodium Latest Ref Range: 135 - 145 mEq/L 137  Potassium Latest Ref Range: 3.5 - 5.1 mEq/L 4.0  Chloride Latest Ref Range: 96 - 112 mEq/L 104  CO2 Latest Ref Range: 19 - 32 mEq/L 25  Glucose Latest Ref Range: 70 - 99 mg/dL 77  BUN Latest Ref Range: 6 - 23 mg/dL 12  Creatinine Latest Ref Range: 0.40 - 1.20 mg/dL 0.58  Calcium Latest Ref Range: 8.4 - 10.5 mg/dL 9.5  Alkaline Phosphatase Latest Ref Range: 39 - 117 U/L 100  Albumin Latest Ref Range: 3.5 - 5.2 g/dL 4.2  AST Latest Ref Range: 0 - 37 U/L 26  ALT Latest Ref Range: 0 - 35 U/L 31  Total Protein Latest Ref Range: 6.0 - 8.3 g/dL 7.5  Bilirubin, Direct Latest Ref Range: 0.0 - 0.3 mg/dL 0.1  Total Bilirubin Latest Ref Range: 0.2 - 1.2 mg/dL 0.5  GFR Latest Ref Range: >60.00 mL/min 101.80  Total CHOL/HDL Ratio Unknown 3  Cholesterol Latest Ref Range: 0 - 200 mg/dL 163  HDL  Cholesterol Latest Ref Range: >39.00 mg/dL 49.00  LDL (calc) Latest Ref Range: 0 - 99 mg/dL 99  NonHDL Unknown 114.12  Triglycerides Latest Ref Range: 0.0 - 149.0 mg/dL 74.0  VLDL Latest Ref Range: 0.0 - 40.0 mg/dL 14.8  VITD Latest Ref Range: 30.00 - 100.00 ng/mL 39.82  Vitamin B12 Latest Ref Range: 211 - 911 pg/mL 885     Thoracic Spine X-Ray 01/25/21   Old mild superior endplate compression deformities are seen involving the T9 and T10 vertebral bodies, which are stable since  prior exam. No evidence of acute thoracic spine fracture or subluxation. No focal lytic or sclerotic bone lesions identified. Anterior vertebral osteophytosis is noted at T11-12. Intervertebral disc spaces are maintained. Mild lower thoracic dextroscoliosis is also stable since prior study.  IMPRESSION: No acute findings.  Old mild T9 and T10 vertebral body compression deformities and mild lower thoracic dextroscoliosis. ASSESSMENT/PLAN/RECOMMENDATIONS:   1. Osteoporosis:  - Pt has an old compression fracture of the thoracic spine based on that information I have recommended Evenity or Forteo/Tymlos. But she declined these due to family hx of strokes, CAD abd cancer, despite my assurance she is low risk.   - She has opted to proceed with Prolia, discussed side effects of local reaction, hypocalcemia and as well as low risk of atypical fracture and jaw osteonecrosis with prolonged use.  - Will proceed with investingating secondary causes of osteoporosis such as cushing syndrome and primary hypercalciuria.  - Calcium and GFR have been normal as well as vitamin D - Emphasized the importance of calcium and vitamin D intake  - Will proceed with DXA scan    Medications : Vitamin D 3000 iu daily  Calcium 1200 mg daily  Prolia 60 mg Beemer Q 6 months    F/U in 6 months   Signed electronically by: Mack Guise, MD  [facility]  [facility] [place] [facility] [address] [place], [place] [address] Phone: [phone] FAX: [phone]   CC: [place], [place], [place] [address] Phone: [phone] Fax: [phone]   Return to Endocrinology clinic as below: Future Appointments  Date Time Provider [place]  04/10/2021  8:00 AM Rankin, Clent Demark, MD [facility] None  07/26/2021 10:00 AM Biagio Borg, MD [facility] None

## 2021-02-01 ENCOUNTER — Ambulatory Visit (INDEPENDENT_AMBULATORY_CARE_PROVIDER_SITE_OTHER): Payer: PRIVATE HEALTH INSURANCE | Admitting: Internal Medicine

## 2021-02-01 ENCOUNTER — Telehealth: Payer: Self-pay | Admitting: Internal Medicine

## 2021-02-01 ENCOUNTER — Encounter: Payer: Self-pay | Admitting: Internal Medicine

## 2021-02-01 ENCOUNTER — Other Ambulatory Visit: Payer: Self-pay

## 2021-02-01 VITALS — BP 110/72 | HR 80 | Ht 64.0 in | Wt 225.1 lb

## 2021-02-01 DIAGNOSIS — M810 Age-related osteoporosis without current pathological fracture: Secondary | ICD-10-CM

## 2021-02-01 NOTE — Telephone Encounter (Signed)
Can you please add this to the Prolia Portal     Thanks Brandy   Abby Nena Jordan, MD  [facility]  [facility] [address] [place], [place] [address] Phone: [phone] FAX: [phone]

## 2021-02-01 NOTE — Patient Instructions (Addendum)
24-Hour Urine Collection   You will be collecting your urine for a 24-hour period of time.  Your timer starts with your first urine of the morning (For example - If you first pee at [place], your timer will start at [place])  [place] away your first urine of the morning  Collect your urine every time you pee for the next 24 hours STOP your urine collection 24 hours after you started the collection (For example - You would stop at 9AM the day after you started)     Please Contact [place] office to schedule a bone density [phone] located at [address] [place]     Please have labs done when you drop  Off urine sample

## 2021-02-06 NOTE — Addendum Note (Signed)
Addended by: Terence Lux A on: 02/06/2021 11:41 AM   Modules accepted: Orders

## 2021-02-06 NOTE — Telephone Encounter (Signed)
Prolia VOB initiated via [email or web address]  Last OV: 02/01/21 Next OV:  Last Prolia inj: NEW start Next Prolia inj DUE:

## 2021-02-08 ENCOUNTER — Other Ambulatory Visit: Payer: Self-pay

## 2021-02-08 ENCOUNTER — Ambulatory Visit (INDEPENDENT_AMBULATORY_CARE_PROVIDER_SITE_OTHER)
Admission: RE | Admit: 2021-02-08 | Discharge: 2021-02-08 | Disposition: A | Discharge status: Home / Self Care | Payer: PRIVATE HEALTH INSURANCE | Source: Ambulatory Visit | Attending: Internal Medicine | Admitting: Internal Medicine

## 2021-02-08 ENCOUNTER — Other Ambulatory Visit (INDEPENDENT_AMBULATORY_CARE_PROVIDER_SITE_OTHER): Payer: PRIVATE HEALTH INSURANCE

## 2021-02-08 DIAGNOSIS — M810 Age-related osteoporosis without current pathological fracture: Secondary | ICD-10-CM

## 2021-02-11 DIAGNOSIS — H401133 Primary open-angle glaucoma, bilateral, severe stage: Secondary | ICD-10-CM | POA: Diagnosis not present

## 2021-02-11 DIAGNOSIS — H31003 Unspecified chorioretinal scars, bilateral: Secondary | ICD-10-CM | POA: Diagnosis not present

## 2021-02-11 LAB — PHOSPHORUS: Phosphorus: 4.2 mg/dL (ref 2.3–4.6)

## 2021-02-11 LAB — MAGNESIUM: Magnesium: 2.1 mg/dL (ref 1.5–2.5)

## 2021-02-12 LAB — PARATHYROID HORMONE, INTACT (NO CA): PTH: 65 pg/mL (ref 16–77)

## 2021-02-12 LAB — CORTISOL, URINE, 24 HOUR
24 Hour urine volume (VMAHVA): 2050 mL
CREATININE, URINE: 1.07 g/(24.h) (ref 0.50–2.15)
Cortisol (Ur), Free: 26.4 mcg/24 h (ref 4.0–50.0)

## 2021-02-12 LAB — CALCIUM, URINE, 24 HOUR: Calcium, 24H Urine: 148 mg/24 h

## 2021-02-14 ENCOUNTER — Telehealth: Payer: Self-pay | Admitting: Internal Medicine

## 2021-02-14 DIAGNOSIS — M542 Cervicalgia: Secondary | ICD-10-CM | POA: Diagnosis not present

## 2021-02-14 NOTE — Telephone Encounter (Signed)
Patient called to ask questions about results from labs and to find out if medication is going to be needed as suggested by provider at visit.  Call back # [phone]

## 2021-02-14 NOTE — Telephone Encounter (Signed)
Spoken to patient and notified patient that Dr Kelton Pillar is out of the office this week. However, inform Dr Kelton Pillar will get to lab result as soon as she can. Patient verbalized understanding.

## 2021-02-15 NOTE — Telephone Encounter (Signed)
Brandi,  Any updates?

## 2021-02-15 NOTE — Telephone Encounter (Signed)
Spoken to patient and notified Dr Shamleffer's comments. Verbalized understanding.   

## 2021-02-21 DIAGNOSIS — M25511 Pain in right shoulder: Secondary | ICD-10-CM | POA: Insufficient documentation

## 2021-03-05 NOTE — Telephone Encounter (Signed)
Pt ready for scheduling  Out-of-pocket cost due at time of visit: $0.00  Primary: [facility] Prolia co-insurance: 0% Admin fee co-insurance: 0%   Secondary: Medicare Prolia co-insurance: 20% Admin fee co-insurance: 20%  Deductible: $124.73 of $233 met  Prior Auth: not required PA# Valid:

## 2021-03-26 ENCOUNTER — Encounter: Payer: Self-pay | Admitting: Internal Medicine

## 2021-04-01 ENCOUNTER — Other Ambulatory Visit: Payer: Self-pay | Admitting: Internal Medicine

## 2021-04-01 MED ORDER — ALENDRONATE SODIUM 70 MG PO TABS: 70.0000 mg | ORAL_TABLET | ORAL | 3 refills | Status: DC

## 2021-04-10 ENCOUNTER — Encounter (INDEPENDENT_AMBULATORY_CARE_PROVIDER_SITE_OTHER): Payer: PRIVATE HEALTH INSURANCE | Admitting: Ophthalmology

## 2021-04-12 ENCOUNTER — Ambulatory Visit: Payer: PRIVATE HEALTH INSURANCE

## 2021-04-13 NOTE — Telephone Encounter (Signed)
See Patient Message 03/26/21.  Pt archived in Prolia portal.

## 2021-05-15 ENCOUNTER — Other Ambulatory Visit: Payer: Self-pay

## 2021-05-15 ENCOUNTER — Ambulatory Visit (INDEPENDENT_AMBULATORY_CARE_PROVIDER_SITE_OTHER): Payer: PRIVATE HEALTH INSURANCE | Admitting: Ophthalmology

## 2021-05-15 ENCOUNTER — Encounter (INDEPENDENT_AMBULATORY_CARE_PROVIDER_SITE_OTHER): Payer: Self-pay | Admitting: Ophthalmology

## 2021-05-15 DIAGNOSIS — H4421 Degenerative myopia, right eye: Secondary | ICD-10-CM | POA: Diagnosis not present

## 2021-05-15 DIAGNOSIS — H4422 Degenerative myopia, left eye: Secondary | ICD-10-CM | POA: Diagnosis not present

## 2021-05-15 DIAGNOSIS — H4053X2 Glaucoma secondary to other eye disorders, bilateral, moderate stage: Secondary | ICD-10-CM | POA: Diagnosis not present

## 2021-05-15 DIAGNOSIS — H3322 Serous retinal detachment, left eye: Secondary | ICD-10-CM

## 2021-05-15 NOTE — Assessment & Plan Note (Addendum)
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  And while this is well demarcated, with corneal retinopexy, uncertain as to whether this would progress and/or stimulate neovascular disorder of the eye if the silicone oil to be removed.  With her high myopia the silicone oil and pseudophakic states she will make her is basically emmetropic with 20/30 vision.  I have recommended to leave the 5000 cSt oil, so as to delay development of complications of oil removal, and maintain current acuity as the patient functions very well at this time  Patient also to continue on oral vitamin B complex supplementation daily while the wall in place

## 2021-05-15 NOTE — Progress Notes (Signed)
05/15/2021     CHIEF COMPLAINT Patient presents for  Chief Complaint  Patient presents with   Retina Follow Up      HISTORY OF PRESENT ILLNESS: [name] is a 55 y.o. female who presents to the clinic today for:   HPI     Retina Follow Up   Patient presents with  Other.  In both eyes.  This started 7 months ago.  Severity is mild.  Duration of 7 months.  Since onset it is stable.        Comments   7 month fu OU and OCT/FP Pt states VA OU stable since last visit. Pt denies FOL, floaters, or ocular pain OU.  Pt reports using Brimonidine BID OU and Timolol BID OU  History of complex retinal detachment and repair OS in the setting of high myopia and recurrence of retinal detachment.  Chronic longstanding silicone oil in place with excellent ambulatory acuity maintained preserved functioning OS      Last edited by Hurman Horn, MD on 05/15/2021  8:26 AM.      Referring physician: Biagio Borg, MD [place],  [place] [address]  HISTORICAL INFORMATION:   Selected notes from the MEDICAL RECORD NUMBER    Lab Results  Component Value Date   HGBA1C 5.8 01/25/2021     CURRENT MEDICATIONS: Current Outpatient Medications (Ophthalmic Drugs)  Medication Sig   brimonidine (ALPHAGAN P) 0.1 % SOLN Place 1 drop into both eyes 2 (two) times daily.   timolol (BETIMOL) 0.25 % ophthalmic solution Place 1 drop into both eyes 2 (two) times daily.   No current facility-administered medications for this visit. (Ophthalmic Drugs)   Current Outpatient Medications (Other)  Medication Sig   alendronate (FOSAMAX) 70 MG tablet Take 1 tablet (70 mg total) by mouth every 7 (seven) days. Take with a full glass of water on an empty stomach.   aspirin 81 MG EC tablet Take 1 tablet (81 mg total) by mouth daily. Swallow whole.   ezetimibe-simvastatin (VYTORIN) 10-40 MG tablet Take 1 tablet by mouth daily.   No current facility-administered medications for this visit. (Other)       REVIEW OF SYSTEMS:    ALLERGIES Allergies  Allergen Reactions   Crestor [Rosuvastatin Calcium] Other (See Comments)    Myalgias    Lipitor [Atorvastatin] Other (See Comments)    myalgia   Lovastatin Other (See Comments)    myalgias   Statins Nausea And Vomiting   Topamax Other (See Comments)    dizziness    PAST MEDICAL HISTORY Past Medical History:  Diagnosis Date   ANXIETY 03/27/2010   BACK PAIN, CHRONIC 03/27/2010   Classic migraine with aura 05/30/2015   COMMON MIGRAINE 03/27/2010   [place] DISEASE, CERVICAL 03/27/2010   FATIGUE 03/27/2010   GERD 03/27/2010   Glaucoma    bilateral   HYPERLIPIDEMIA 03/27/2010   Multiple sclerosis ([place]) 10/23/2010   unconfirmed, but suspected   Myocardial infarction [facility])    "old"   Osteoporosis 09/24/2012   Unspecified vitamin D deficiency 10/23/2010   Past Surgical History:  Procedure Laterality Date   ABDOMINAL HYSTERECTOMY     LEFT HEART CATHETERIZATION WITH CORONARY ANGIOGRAM N/A 03/31/2014   Procedure: LEFT HEART CATHETERIZATION WITH CORONARY ANGIOGRAM;  Surgeon: Pixie Casino, MD;  Location: [facility] CATH LAB;  Service: Cardiovascular;  Laterality: N/A;   Neg stress test  2007   s/p eye surgury     s/p knee surgury  FAMILY HISTORY Family History  Problem Relation Age of Onset   Heart disease Father    Diabetes Father    Hyperlipidemia Father    Hypertension Father    Colon polyps Father    Cancer Mother        breast   Hyperlipidemia Mother    Colon polyps Mother    Diabetes Other    Arthritis Other    Dementia Other    Cancer Other        2 uncles with lung cancer   Heart disease Other        2 uncles with heart disease   Lupus Maternal Aunt    Lupus Cousin        mother and father's side   Colon cancer Neg Hx    Esophageal cancer Neg Hx    Stomach cancer Neg Hx    Rectal cancer Neg Hx     SOCIAL HISTORY Social History   Tobacco Use   Smoking status: Never   Smokeless tobacco: Never  Vaping Use    Vaping Use: Never used  Substance Use Topics   Alcohol use: No    Alcohol/week: 0.0 standard drinks   Drug use: No         OPHTHALMIC EXAM:  Base Eye Exam     Visual Acuity (ETDRS)       Right Left   Dist cc 20/100 -1 20/30 -2   Dist ph cc NI NI    Correction: Glasses         Tonometry (Tonopen, 8:00 AM)       Right Left   Pressure 14 12         Pupils       Pupils Dark Light Shape React APD   Right PERRL 6 6 Round Minimal None   Left PERRL 5 4 Round Brisk None         Visual Fields       Left Right   Restrictions Total superior temporal, superior nasal deficiencies Total inferior temporal deficiency; Partial outer inferior nasal deficiency         Extraocular Movement       Right Left    Full Full         Neuro/Psych     Oriented x3: Yes   Mood/Affect: Normal         Dilation     Both eyes: 1.0% Mydriacyl, 2.5% Phenylephrine @ 8:00 AM           Slit Lamp and Fundus Exam     External Exam       Right Left   External Normal Normal         Slit Lamp Exam       Right Left   Lids/Lashes Normal Normal   Conjunctiva/Sclera White and quiet White and quiet   Cornea Clear Clear   Anterior Chamber Deep and quiet AC deep, no emulsification of oil   Iris Round and reactive Round and reactive   Lens Aphakia Centered posterior chamber intraocular lens   Anterior Vitreous Clear Silicone oil clear, no emulsification         Fundus Exam       Right Left   Posterior Vitreous Clear vitrectomized Silicone oil clear   Disc Tilted cup, Tilted disc, posterior staphyloma to the sclera Tilted cup, Tilted disc posterior staphyloma to the sclera   C/D Ratio 0.55 0.3   Macula Myopic macular degeneration Myopic macular degeneration  Vessels Normal Normal   Periphery Good chorioretinal scarring 360, no retinal tears or detachment Old, traction retinal detachment at an anterior to the equator inferonasal and nasal, stable over years             IMAGING AND PROCEDURES  Imaging and Procedures for 05/15/21  OCT, Retina - OU - Both Eyes       Right Eye Quality was borderline. Scan locations included subfoveal. Progression has been stable.   Left Eye Quality was borderline. Scan locations included subfoveal. Progression has been stable. Findings include subretinal hyper-reflective material.      Color Fundus Photography Optos - OU - Both Eyes       Right Eye Progression has been stable. Disc findings include normal observations, increased cup to disc ratio. Macula : geographic atrophy. Vessels : normal observations. Periphery : normal observations.   Left Eye Progression has been stable. Macula : geographic atrophy.   Notes OS with clear silicone, retina attached.  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  OD, acuity limited by geographic atrophy of myopic macular degeneration.  Slight posterior staphyloma no active disease.  Peripheral chorioretinal scarring and laser retinopexy, retina attached.             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  And while this is well demarcated, with corneal retinopexy, uncertain as to whether this would progress and/or stimulate neovascular disorder of the eye if the silicone oil to be removed.  With her high myopia the silicone oil and pseudophakic states she will make her is basically emmetropic with  20/30 vision.  I have recommended to leave the 5000 cSt oil, so as to delay development of complications of oil removal, and maintain current acuity as the patient functions very well at this time  Patient also to continue on oral vitamin B complex supplementation daily while the wall in place     ICD-10-CM   1. Partial retinal detachment, left  H33.22 OCT, Retina - OU - Both Eyes    Color Fundus Photography Optos - OU - Both Eyes    2. Right degenerative progressive high myopia  H44.21 OCT, Retina - OU - Both Eyes    Color Fundus Photography Optos - OU - Both Eyes    3. Left degenerative progressive high myopia  H44.22     4. Secondary glaucoma due to combination mechanisms, bilateral, moderate stage  H40.53X2       1.  OS looks great with oil in place retina macula flat and attached.  In order to prevent complications related to atrophic retinal detachment recurrent and not reparable surgically nasally, we will leave the oil in place until or unless complications develop requiring dealing with the issue of removal of silicone oil and its aftermath  2.  3.  Ophthalmic Meds Ordered this visit:  No orders of the defined types were placed in this encounter.      No follow-ups on file.  There are no Patient Instructions on file for this visit.   Explained  the diagnoses, plan, and follow up with the patient and they expressed understanding.  Patient expressed understanding of the importance of proper follow up care.   Clent Demark Javona Bergevin M.D. Diseases & Surgery of the Retina and Vitreous Retina & Diabetic [place] 05/15/21     Abbreviations: M myopia (nearsighted); A astigmatism; H hyperopia (farsighted); P presbyopia; Mrx spectacle prescription;  CTL contact lenses; OD right eye; OS left eye; OU both eyes  XT exotropia; ET esotropia; PEK punctate epithelial keratitis; PEE punctate epithelial erosions; DES dry eye syndrome; MGD meibomian gland dysfunction; ATs artificial  tears; PFAT's preservative free artificial tears; [place] nuclear sclerotic cataract; PSC posterior subcapsular cataract; ERM epi-retinal membrane; PVD posterior vitreous detachment; RD retinal detachment; DM diabetes mellitus; DR diabetic retinopathy; NPDR non-proliferative diabetic retinopathy; PDR proliferative diabetic retinopathy; CSME clinically significant macular edema; DME diabetic macular edema; dbh dot blot hemorrhages; CWS cotton wool spot; POAG primary open angle glaucoma; C/D cup-to-disc ratio; HVF humphrey visual field; GVF goldmann visual field; OCT optical coherence tomography; IOP intraocular pressure; BRVO Branch retinal vein occlusion; CRVO central retinal vein occlusion; CRAO central retinal artery occlusion; BRAO branch retinal artery occlusion; RT retinal tear; SB scleral buckle; PPV pars plana vitrectomy; VH Vitreous hemorrhage; PRP panretinal laser photocoagulation; IVK intravitreal kenalog; VMT vitreomacular traction; MH Macular hole;  NVD neovascularization of the disc; NVE neovascularization elsewhere; AREDS age related eye disease study; ARMD age related macular degeneration; POAG primary open angle glaucoma; EBMD epithelial/anterior basement membrane dystrophy; ACIOL anterior chamber intraocular lens; IOL intraocular lens; PCIOL posterior chamber intraocular lens; Phaco/IOL phacoemulsification with intraocular lens placement; [place] photorefractive keratectomy; LASIK laser assisted in situ keratomileusis; HTN hypertension; DM diabetes mellitus; COPD chronic obstructive pulmonary disease

## 2021-05-15 NOTE — Assessment & Plan Note (Signed)
Stable with associated posterior staphyloma similar to OD

## 2021-05-15 NOTE — Assessment & Plan Note (Signed)
Patient continues on topical medications with excellent IOP control

## 2021-05-23 ENCOUNTER — Other Ambulatory Visit: Payer: Self-pay | Admitting: Internal Medicine

## 2021-06-02 ENCOUNTER — Ambulatory Visit (INDEPENDENT_AMBULATORY_CARE_PROVIDER_SITE_OTHER): Payer: PRIVATE HEALTH INSURANCE | Admitting: *Deleted

## 2021-06-02 DIAGNOSIS — Z0000 Encounter for general adult medical examination without abnormal findings: Secondary | ICD-10-CM | POA: Diagnosis not present

## 2021-06-02 NOTE — Patient Instructions (Signed)
Health Maintenance, Female Adopting a healthy lifestyle and getting preventive care are important in promoting health and wellness. Ask your health care provider about: The right schedule for you to have regular tests and exams. Things you can do on your own to prevent diseases and keep yourself healthy. What should I know about diet, weight, and exercise? Eat a healthy diet  Eat a diet that includes plenty of vegetables, fruits, low-fat dairy products, and lean protein. Do not eat a lot of foods that are high in solid fats, added sugars, or sodium. Maintain a healthy weight Body mass index (BMI) is used to identify weight problems. It estimates body fat based on height and weight. Your health care provider can help determine your BMI and help you achieve or maintain a healthy weight. Get regular exercise Get regular exercise. This is one of the most important things you can do for your health. Most adults should: Exercise for at least 150 minutes each week. The exercise should increase your heart rate and make you sweat (moderate-intensity exercise). Do strengthening exercises at least twice a week. This is in addition to the moderate-intensity exercise. Spend less time sitting. Even light physical activity can be beneficial. Watch cholesterol and blood lipids Have your blood tested for lipids and cholesterol at 56 years of age, then have this test every 5 years. Have your cholesterol levels checked more often if: Your lipid or cholesterol levels are high. You are older than 56 years of age. You are at high risk for heart disease. What should I know about cancer screening? Depending on your health history and family history, you may need to have cancer screening at various ages. This may include screening for: Breast cancer. Cervical cancer. Colorectal cancer. Skin cancer. Lung cancer. What should I know about heart disease, diabetes, and high blood pressure? Blood pressure and heart  disease High blood pressure causes heart disease and increases the risk of stroke. This is more likely to develop in people who have high blood pressure readings, are of African descent, or are overweight. Have your blood pressure checked: Every 3-5 years if you are 18-39 years of age. Every year if you are 40 years old or older. Diabetes Have regular diabetes screenings. This checks your fasting blood sugar level. Have the screening done: Once every three years after age 40 if you are at a normal weight and have a low risk for diabetes. More often and at a younger age if you are overweight or have a high risk for diabetes. What should I know about preventing infection? Hepatitis B If you have a higher risk for hepatitis B, you should be screened for this virus. Talk with your health care provider to find out if you are at risk for hepatitis B infection. Hepatitis C Testing is recommended for: Everyone born from 1945 through 1965. Anyone with known risk factors for hepatitis C. Sexually transmitted infections (STIs) Get screened for STIs, including gonorrhea and chlamydia, if: You are sexually active and are younger than 56 years of age. You are older than 56 years of age and your health care provider tells you that you are at risk for this type of infection. Your sexual activity has changed since you were last screened, and you are at increased risk for chlamydia or gonorrhea. Ask your health care provider if you are at risk. Ask your health care provider about whether you are at high risk for HIV. Your health care provider may recommend a prescription medicine   to help prevent HIV infection. If you choose to take medicine to prevent HIV, you should first get tested for HIV. You should then be tested every 3 months for as long as you are taking the medicine. Pregnancy If you are about to stop having your period (premenopausal) and you may become pregnant, seek counseling before you get  pregnant. Take 400 to 800 micrograms (mcg) of folic acid every day if you become pregnant. Ask for birth control (contraception) if you want to prevent pregnancy. Osteoporosis and menopause Osteoporosis is a disease in which the bones lose minerals and strength with aging. This can result in bone fractures. If you are 65 years old or older, or if you are at risk for osteoporosis and fractures, ask your health care provider if you should: Be screened for bone loss. Take a calcium or vitamin D supplement to lower your risk of fractures. Be given hormone replacement therapy (HRT) to treat symptoms of menopause. Follow these instructions at home: Lifestyle Do not use any products that contain nicotine or tobacco, such as cigarettes, e-cigarettes, and chewing tobacco. If you need help quitting, ask your health care provider. Do not use street drugs. Do not share needles. Ask your health care provider for help if you need support or information about quitting drugs. Alcohol use Do not drink alcohol if: Your health care provider tells you not to drink. You are pregnant, may be pregnant, or are planning to become pregnant. If you drink alcohol: Limit how much you use to 0-1 drink a day. Limit intake if you are breastfeeding. Be aware of how much alcohol is in your drink. In the [place] one drink equals one 12 oz bottle of beer (355 mL), one 5 oz glass of wine (148 mL), or one 1 oz glass of hard liquor (44 mL). General instructions Schedule regular health, dental, and eye exams. Stay current with your vaccines. Tell your health care provider if: You often feel depressed. You have ever been abused or do not feel safe at home. Summary Adopting a healthy lifestyle and getting preventive care are important in promoting health and wellness. Follow your health care provider's instructions about healthy diet, exercising, and getting tested or screened for diseases. Follow your health care provider's  instructions on monitoring your cholesterol and blood pressure. This information is not intended to replace advice given to you by your health care provider. Make sure you discuss any questions you have with your health care provider. Document Revised: 11/02/2020 Document Reviewed: 08/18/2018 Elsevier Patient Education  2022 [employer]  

## 2021-06-02 NOTE — Progress Notes (Addendum)
Subjective:   [name] is a 55 y.o. female who presents for Medicare Annual (Subsequent) preventive examination.  I connected with  [name] on 06/02/21 by audio enabled telemedicine application and verified that I am speaking with the correct person using two identifiers.   I discussed the limitations of evaluation and management by telemedicine. The patient expressed understanding and agreed to proceed.  Location of Patient: Home Location of Provider: Home Office Persons participating in visit: [name] (patient) & Jari Favre, CMA   Review of Systems    Defer to PCP Cardiac Risk Factors include: none     Objective:    There were no vitals filed for this visit. There is no height or weight on file to calculate BMI.  Advanced Directives 06/02/2021 03/14/2016 05/30/2015 05/24/2015 05/20/2015 05/08/2015 03/27/2015  Does Patient Have a Medical Advance Directive? No No No No No No No  Would patient like information on creating a medical advance directive? Yes (ED - Information included in AVS) No - patient declined information - No - patient declined information No - patient declined information No - patient declined information -    Current Medications (verified) Outpatient Encounter Medications as of 06/02/2021  Medication Sig   alendronate (FOSAMAX) 70 MG tablet Take 1 tablet (70 mg total) by mouth every 7 (seven) days. Take with a full glass of water on an empty stomach.   aspirin 81 MG EC tablet Take 1 tablet (81 mg total) by mouth daily. Swallow whole.   brimonidine (ALPHAGAN P) 0.1 % SOLN Place 1 drop into both eyes 2 (two) times daily.   ezetimibe-simvastatin (VYTORIN) 10-40 MG tablet Take 1 tablet by mouth daily. Patient Needs office visit for future refills   timolol (BETIMOL) 0.25 % ophthalmic solution Place 1 drop into both eyes 2 (two) times daily.   No facility-administered encounter medications on file as of 06/02/2021.    Allergies (verified) Topamax    History: Past Medical History:  Diagnosis Date   ANXIETY 03/27/2010   BACK PAIN, CHRONIC 03/27/2010   Classic migraine with aura 05/30/2015   COMMON MIGRAINE 03/27/2010   [place] DISEASE, CERVICAL 03/27/2010   FATIGUE 03/27/2010   GERD 03/27/2010   Glaucoma    bilateral   HYPERLIPIDEMIA 03/27/2010   Multiple sclerosis ([place]) 10/23/2010   unconfirmed, but suspected   Myocardial infarction [facility])    "old"   Osteoporosis 09/24/2012   Unspecified vitamin D deficiency 10/23/2010   Past Surgical History:  Procedure Laterality Date   ABDOMINAL HYSTERECTOMY     LEFT HEART CATHETERIZATION WITH CORONARY ANGIOGRAM N/A 03/31/2014   Procedure: LEFT HEART CATHETERIZATION WITH CORONARY ANGIOGRAM;  Surgeon: Pixie Casino, MD;  [facility] C[facility] LAB;  Service: Cardiovascular;  Laterality: N/A;   Neg stress test  2007   s/p eye surgury     s/p knee surgury     Family History  Problem Relation Age of Onset   Heart disease Father    Diabetes Father    Hyperlipidemia Father    Hypertension Father    Colon polyps Father    Cancer Mother        breast   Hyperlipidemia Mother    Colon polyps Mother    Diabetes Other    Arthritis Other    Dementia Other    Cancer Other        2 uncles with lung cancer   Heart disease Other        2 uncles with heart disease  Lupus Maternal Aunt    Lupus Cousin        mother and father's side   Colon cancer Neg Hx    Esophageal cancer Neg Hx    Stomach cancer Neg Hx    Rectal cancer Neg Hx    Social History   Socioeconomic History   Marital status: Married    Spouse name: [name]   Number of children: 1   Years of education: College   Highest education level: Not on file  Occupational History   Occupation: Disabled    Comment:  due to "MS" since 2005  Tobacco Use   Smoking status: Never   Smokeless tobacco: Never  Vaping Use   Vaping Use: Never used  Substance and Sexual Activity   Alcohol use: No    Alcohol/week: 0.0 standard drinks   Drug  use: No   Sexual activity: Not on file  Other Topics Concern   Not on file  Social History Narrative   Lives with her husband and son.   Patient is right handed.   Patient does not drink caffeine.   Social Determinants of Health   Financial Resource Strain: Low Risk    Difficulty of Paying Living Expenses: Not hard at all  Food Insecurity: No Food Insecurity   Worried About Charity fundraiser in the Last Year: Never true   [place] in the Last Year: Never true  Transportation Needs: No Transportation Needs   Lack of Transportation (Medical): No   Lack of Transportation (Non-Medical): No  Physical Activity: Sufficiently Active   Days of Exercise per Week: 5 days   Minutes of Exercise per Session: 40 min  Stress: No Stress Concern Present   Feeling of Stress : Not at all  Social Connections: Socially Integrated   Frequency of Communication with Friends and Family: More than three times a week   Frequency of Social Gatherings with Friends and Family: More than three times a week   Attends Religious Services: More than 4 times per year   Active Member of [employer] or Organizations: Yes   Attends Music therapist: More than 4 times per year   Marital Status: Married    Tobacco Counseling Counseling given: Not Answered   Clinical Intake:     Pain : No/denies pain     BMI - recorded: 38.62 Nutritional Status: BMI > 30  Obese Nutritional Risks: Other (Comment) Diabetes: No  How often do you need to have someone help you when you read instructions, pamphlets, or other written materials from your doctor or pharmacy?: 1 - Never  Diabetic? No  Interpreter Needed?: No      Activities of Daily Living In your present state of health, do you have any difficulty performing the following activities: 06/02/2021  Hearing? N  Vision? N  Difficulty concentrating or making decisions? N  Walking or climbing stairs? N  Dressing or bathing? N  Doing errands,  shopping? N  Preparing Food and eating ? N  Using the Toilet? N  In the past six months, have you accidently leaked urine? N  Do you have problems with loss of bowel control? N  Managing your Medications? N  Managing your Finances? N  Housekeeping or managing your Housekeeping? N  Some recent data might be hidden    Patient Care Team: Biagio Borg, MD as PCP - General  Indicate any recent Medical Services you may have received from other than [facility] providers in  the past year (date may be approximate).     Assessment:   This is a routine wellness examination for [place].  Hearing/Vision screen No results found.  Dietary issues and exercise activities discussed: Current Exercise Habits: Home exercise routine, Type of exercise: treadmill, Time (Minutes): 45, Frequency (Times/Week): 5, Weekly Exercise (Minutes/Week): 225, Intensity: Mild, Exercise limited by: None identified   Goals Addressed   None    Depression Screen PHQ 2/9 Scores 06/02/2021 01/25/2021 01/25/2021 07/01/2019 01/12/2018 01/07/2017 01/04/2016  PHQ - 2 Score 0 0 0 0 0 0 0  PHQ- 9 Score - - - - 0 - -    Fall Risk Fall Risk  06/02/2021 01/25/2021 07/01/2019 01/12/2018 01/07/2017  Falls in the past year? 0 0 0 No No  Number falls in past yr: 0 0 - - -  Injury with Fall? 0 0 - - -    FALL RISK PREVENTION PERTAINING TO THE HOME:  Any stairs in or around the home? No  If so, are there any without handrails?  N/a Home free of loose throw rugs in walkways, pet beds, electrical cords, etc? Yes  Adequate lighting in your home to reduce risk of falls? Yes   ASSISTIVE DEVICES UTILIZED TO PREVENT FALLS:  Life alert? No  Use of a cane, walker or w/c? No  Grab bars in the bathroom? No  Shower chair or bench in shower? No  Elevated toilet seat or a handicapped toilet? No   TIMED UP AND GO:  Was the test performed?  n/a .  Length of time to ambulate 10 feet: n/a sec.     Cognitive Function:     6CIT Screen 06/02/2021   What Year? 0 points  What month? 0 points  What time? 0 points  Count back from 20 2 points  Months in reverse 0 points  Repeat phrase 4 points  Total Score 6    Immunizations Immunization History  Administered Date(s) Administered   Influenza,inj,Quad PF,6+ Mos 07/01/2019   PFIZER(Purple Top)SARS-COV-2 Vaccination 11/19/2019, 12/13/2019, 07/09/2020   Td 03/27/2010   Tdap 01/25/2021    TDAP status: Up to date  Flu Vaccine status: Due, Education has been provided regarding the importance of this vaccine. Advised may receive this vaccine at local pharmacy or Health Dept. Aware to provide a copy of the vaccination record if obtained from local pharmacy or Health Dept. Verbalized acceptance and understanding.  Pneumococcal vaccine status: Due, Education has been provided regarding the importance of this vaccine. Advised may receive this vaccine at local pharmacy or Health Dept. Aware to provide a copy of the vaccination record if obtained from local pharmacy or Health Dept. Verbalized acceptance and understanding.  Covid-19 vaccine status: Information provided on how to obtain vaccines.   Qualifies for Shingles Vaccine? Yes   Zostavax completed No   Shingrix Completed?: No.    Education has been provided regarding the importance of this vaccine. Patient has been advised to call insurance company to determine out of pocket expense if they have not yet received this vaccine. Advised may also receive vaccine at local pharmacy or Health Dept. Verbalized acceptance and understanding.  Screening Tests Health Maintenance  Topic Date Due   PAP SMEAR-Modifier  06/02/2021 (Originally 12/07/2020)   COVID-19 Vaccine (4 - Booster for Pfizer series) 06/18/2021 (Originally 10/01/2020)   Zoster Vaccines- Shingrix (1 of 2) 09/01/2021 (Originally 07/18/1984)   INFLUENZA VACCINE  12/06/2021 (Originally 04/08/2021)   Hepatitis C Screening  06/02/2022 (Originally [birth year]19/1983)   MAMMOGRAM  11/22/2021    COLONOSCOPY (Pts 45-17yrs Insurance coverage will need to be confirmed)  09/11/2022   TETANUS/TDAP  01/26/2031   HIV Screening  Completed   HPV VACCINES  Aged Out    Health Maintenance  There are no preventive care reminders to display for this patient.   Colorectal cancer screening: Type of screening: Colonoscopy. Completed 09/12/2019. Repeat every 3 years  Mammogram status: Completed 11/23/2019. Repeat every year  Bone Density Status: Not age appropriate  Lung Cancer Screening: (Low Dose CT Chest recommended if Age 67-80 years, 30 pack-year currently smoking OR have quit w/in 15years.) does not qualify.     Additional Screening:  Hepatitis C Screening: does qualify; Not Completed   Vision Screening: Recommended annual ophthalmology exams for early detection of glaucoma and other disorders of the eye. Is the patient up to date with their annual eye exam?  Yes  Who is the provider or what is the name of the office in which the patient attends annual eye exams? Dr Zadie Rhine & Dr Sunday Spillers If pt is not established with a provider, would they like to be referred to a provider to establish care? No .   Dental Screening: Recommended annual dental exams for proper oral hygiene  Community Resource Referral / Chronic Care Management: CRR required this visit?  No   CCM required this visit?  No      Plan:     I have personally reviewed and noted the following in the patient's chart:   Medical and social history Use of alcohol, tobacco or illicit drugs  Current medications and supplements including opioid prescriptions.  Functional ability and status Nutritional status Physical activity Advanced directives List of other physicians Hospitalizations, surgeries, and ER visits in previous 12 months Vitals Screenings to include cognitive, depression, and falls Referrals and appointments  In addition, I have reviewed and discussed with patient certain preventive protocols, quality  metrics, and best practice recommendations. A written personalized care plan for preventive services as well as general preventive health recommendations were provided to patient.     Cannon Kettle, [place]   06/02/2021   Nurse Notes: 11 minutes non face to face   Ms. Taber , Thank you for taking time to come for your Medicare Wellness Visit. I appreciate your ongoing commitment to your health goals. Please review the following plan we discussed and let me know if I can assist you in the future.   These are the goals we discussed:  Goals   None     This is a list of the screening recommended for you and due dates:  Health Maintenance  Topic Date Due   Pap Smear  06/02/2021*   COVID-19 Vaccine (4 - Booster for Pfizer series) 06/18/2021*   Zoster (Shingles) Vaccine (1 of 2) 09/01/2021*   Flu Shot  12/06/2021*   Hepatitis C Screening: USPSTF Recommendation to screen - Ages 18-79 yo.  06/02/2022*   Mammogram  11/22/2021   Colon Cancer Screening  09/11/2022   Tetanus Vaccine  01/26/2031   HIV Screening  Completed   HPV Vaccine  Aged Out  *Topic was postponed. The date shown is not the original due date.      Medical screening examination/treatment/procedure(s) were performed by non-physician practitioner and as supervising physician I was immediately available for consultation/collaboration.  I agree with above. Cathlean Cower, MD

## 2021-06-10 ENCOUNTER — Other Ambulatory Visit: Payer: Self-pay

## 2021-06-10 ENCOUNTER — Telehealth (INDEPENDENT_AMBULATORY_CARE_PROVIDER_SITE_OTHER): Payer: PRIVATE HEALTH INSURANCE | Admitting: Family Medicine

## 2021-06-10 ENCOUNTER — Encounter: Payer: Self-pay | Admitting: Family Medicine

## 2021-06-10 DIAGNOSIS — J069 Acute upper respiratory infection, unspecified: Secondary | ICD-10-CM | POA: Diagnosis not present

## 2021-06-10 MED ORDER — AMOXICILLIN-POT CLAVULANATE 875-125 MG PO TABS: 1.0000 | ORAL_TABLET | Freq: Two times a day (BID) | ORAL | 0 refills | Status: DC

## 2021-06-10 MED ORDER — HYDROCODONE BIT-HOMATROP MBR 5-1.5 MG/5ML PO SOLN: 5.0000 mL | Freq: Three times a day (TID) | ORAL | 0 refills | Status: DC | PRN

## 2021-06-10 MED ORDER — BENZONATATE 200 MG PO CAPS
200.0000 mg | ORAL_CAPSULE | Freq: Three times a day (TID) | ORAL | 1 refills | Status: DC | PRN
Start: 2021-06-10 — End: 2021-07-26

## 2021-06-10 NOTE — Progress Notes (Signed)
Virtual visit completed through WebEx or similar program Patient location: home  Provider location: [place] at [facility], office  Participants: Patient and me (unless stated otherwise below)  Pandemic considerations d/w pt.   Limitations and rationale for visit method d/w patient.  Patient agreed to proceed.   CC: URI.    HPI:  Left ear pain and draining, sore throat, runny eyes, sneezing and cough since Thursday afternoon. Has been taking mucinex and robatussin. Covid test negative; she has taken 3 tests. She is vaccinated for covid.    Sx got worse in the last 5 days.  She has h/o L ear infections over the years.  Cough disrupting sleep.  Cough causes L ear and throat pain.  Green sputum with throat clearing.  Not SOB.  No rash.  No loss of smell.  She has loss of taste but that is typical for patient when she has a cold.  No facial pain.    Meds and allergies reviewed.   ROS: Per HPI unless specifically indicated in ROS section   NAD Speech wnl  A/P: URI, presumed AOM.  D/w pt about options.  Supportive care, start augmentin, use tessalon prn cough then hycodan prn cough if needed, sedation caution d/w pt.  Recently covid neg mult times.  Okay for outpatient f/u.  She agrees with plan.

## 2021-06-10 NOTE — Assessment & Plan Note (Signed)
URI, presumed AOM.  D/w pt about options.  Supportive care, start augmentin, use tessalon prn cough then hycodan prn cough if needed, sedation caution d/w pt.  Recently covid neg mult times.  Okay for outpatient f/u.  She agrees with plan.

## 2021-06-11 DIAGNOSIS — H31003 Unspecified chorioretinal scars, bilateral: Secondary | ICD-10-CM | POA: Diagnosis not present

## 2021-06-11 DIAGNOSIS — H401113 Primary open-angle glaucoma, right eye, severe stage: Secondary | ICD-10-CM | POA: Diagnosis not present

## 2021-06-18 ENCOUNTER — Other Ambulatory Visit: Payer: Self-pay | Admitting: Internal Medicine

## 2021-06-25 ENCOUNTER — Telehealth: Payer: Self-pay | Admitting: Internal Medicine

## 2021-06-25 MED ORDER — EZETIMIBE-SIMVASTATIN 10-40 MG PO TABS
1.0000 | ORAL_TABLET | Freq: Every day | ORAL | 4 refills | Status: DC
Start: 2021-06-25 — End: 2021-12-13

## 2021-06-25 NOTE — Telephone Encounter (Signed)
Refills has been sent to the pharmacy. 

## 2021-06-25 NOTE — Telephone Encounter (Signed)
*  STAT* If patient is at the pharmacy, call can be transferred to refill team.   1. Which medications need to be refilled? (please list name of each medication and dose if known) ezetimibe-simvastatin (VYTORIN) 10-40 MG tablet  2. Which pharmacy/location (including street and city if local pharmacy) is medication to be sent to?  [id number] - [place], [place] - [address]  3. Do they need a 30 day or 90 day supply? [place]

## 2021-07-26 ENCOUNTER — Ambulatory Visit (INDEPENDENT_AMBULATORY_CARE_PROVIDER_SITE_OTHER): Payer: PRIVATE HEALTH INSURANCE | Admitting: Internal Medicine

## 2021-07-26 ENCOUNTER — Encounter: Payer: Self-pay | Admitting: Internal Medicine

## 2021-07-26 ENCOUNTER — Other Ambulatory Visit: Payer: Self-pay

## 2021-07-26 VITALS — BP 112/64 | HR 76 | Temp 98.6°F | Ht 64.0 in | Wt 227.0 lb

## 2021-07-26 DIAGNOSIS — E669 Obesity, unspecified: Secondary | ICD-10-CM | POA: Diagnosis not present

## 2021-07-26 DIAGNOSIS — I2510 Atherosclerotic heart disease of native coronary artery without angina pectoris: Secondary | ICD-10-CM

## 2021-07-26 DIAGNOSIS — Z1159 Encounter for screening for other viral diseases: Secondary | ICD-10-CM | POA: Diagnosis not present

## 2021-07-26 DIAGNOSIS — F410 Panic disorder [episodic paroxysmal anxiety] without agoraphobia: Secondary | ICD-10-CM | POA: Diagnosis not present

## 2021-07-26 DIAGNOSIS — E7800 Pure hypercholesterolemia, unspecified: Secondary | ICD-10-CM

## 2021-07-26 DIAGNOSIS — Z23 Encounter for immunization: Secondary | ICD-10-CM

## 2021-07-26 DIAGNOSIS — E7801 Familial hypercholesterolemia: Secondary | ICD-10-CM | POA: Diagnosis not present

## 2021-07-26 DIAGNOSIS — R739 Hyperglycemia, unspecified: Secondary | ICD-10-CM

## 2021-07-26 DIAGNOSIS — E559 Vitamin D deficiency, unspecified: Secondary | ICD-10-CM | POA: Diagnosis not present

## 2021-07-26 DIAGNOSIS — Z0001 Encounter for general adult medical examination with abnormal findings: Secondary | ICD-10-CM | POA: Diagnosis not present

## 2021-07-26 LAB — BASIC METABOLIC PANEL
BUN: 11 mg/dL (ref 6–23)
CO2: 26 mEq/L (ref 19–32)
Calcium: 9.5 mg/dL (ref 8.4–10.5)
Chloride: 102 mEq/L (ref 96–112)
Creatinine, Ser: 0.64 mg/dL (ref 0.40–1.20)
GFR: 99.07 mL/min (ref >60.00)
Glucose, Bld: 78 mg/dL (ref 70–99)
Potassium: 4 mEq/L (ref 3.5–5.1)
Sodium: 136 mEq/L (ref 135–145)

## 2021-07-26 LAB — URINALYSIS, ROUTINE W REFLEX MICROSCOPIC
Hgb urine dipstick: NEGATIVE
Ketones, ur: 40 — AB
Leukocytes,Ua: NEGATIVE
Nitrite: NEGATIVE
RBC / HPF: NONE SEEN (ref 0–?)
Specific Gravity, Urine: >=1.03 — AB (ref 1.000–1.030)
Total Protein, Urine: NEGATIVE
Urine Glucose: NEGATIVE
Urobilinogen, UA: 0.2 (ref 0.0–1.0)
pH: 5.5 (ref 5.0–8.0)

## 2021-07-26 LAB — CBC WITH DIFFERENTIAL/PLATELET
Basophils Absolute: 0 10*3/uL (ref 0.0–0.1)
Basophils Relative: 0.7 % (ref 0.0–3.0)
Eosinophils Absolute: 0 10*3/uL (ref 0.0–0.7)
Eosinophils Relative: 0.6 % (ref 0.0–5.0)
HCT: 43.3 % (ref 36.0–46.0)
Hemoglobin: 14.4 g/dL (ref 12.0–15.0)
Lymphocytes Relative: 31.3 % (ref 12.0–46.0)
Lymphs Abs: 2 10*3/uL (ref 0.7–4.0)
MCHC: 33.2 g/dL (ref 30.0–36.0)
MCV: 92 fl (ref 78.0–100.0)
Monocytes Absolute: 0.4 10*3/uL (ref 0.1–1.0)
Monocytes Relative: 6.8 % (ref 3.0–12.0)
Neutro Abs: 3.8 10*3/uL (ref 1.4–7.7)
Neutrophils Relative %: 60.6 % (ref 43.0–77.0)
Platelets: 297 10*3/uL (ref 150.0–400.0)
RBC: 4.71 Mil/uL (ref 3.87–5.11)
RDW: 14.1 % (ref 11.5–15.5)
WBC: 6.3 10*3/uL (ref 4.0–10.5)

## 2021-07-26 LAB — LIPID PANEL
Cholesterol: 174 mg/dL (ref 0–200)
HDL: 55.8 mg/dL (ref >39.00)
LDL Cholesterol: 104 mg/dL — ABNORMAL HIGH (ref 0–99)
NonHDL: 118.69
Total CHOL/HDL Ratio: 3
Triglycerides: 72 mg/dL (ref 0.0–149.0)
VLDL: 14.4 mg/dL (ref 0.0–40.0)

## 2021-07-26 LAB — VITAMIN D 25 HYDROXY (VIT D DEFICIENCY, FRACTURES): VITD: 42.52 ng/mL (ref 30.00–100.00)

## 2021-07-26 LAB — HEPATIC FUNCTION PANEL
ALT: 36 U/L — ABNORMAL HIGH (ref 0–35)
AST: 30 U/L (ref 0–37)
Albumin: 4.5 g/dL (ref 3.5–5.2)
Alkaline Phosphatase: 95 U/L (ref 39–117)
Bilirubin, Direct: 0.1 mg/dL (ref 0.0–0.3)
Total Bilirubin: 0.4 mg/dL (ref 0.2–1.2)
Total Protein: 8.1 g/dL (ref 6.0–8.3)

## 2021-07-26 LAB — HEMOGLOBIN A1C: Hgb A1c MFr Bld: 5.8 % (ref 4.6–6.5)

## 2021-07-26 LAB — TSH: TSH: 0.82 u[IU]/mL (ref 0.35–5.50)

## 2021-07-26 NOTE — Assessment & Plan Note (Signed)
Last vitamin D Lab Results  Component Value Date   VD25OH 39.82 01/25/2021   Stable, cont oral replacement

## 2021-07-26 NOTE — Addendum Note (Signed)
Addended by: Terence Lux A on: 07/26/2021 11:00 AM   Modules accepted: Orders

## 2021-07-26 NOTE — Assessment & Plan Note (Signed)
Uncontrolled, goal ldl < 70,  Lab Results  Component Value Date   CHOL 163 01/25/2021   HDL 49.00 01/25/2021   LDLCALC 99 01/25/2021   LDLDIRECT 184.9 08/04/2012   TRIG 74.0 01/25/2021   CHOLHDL 3 01/25/2021   To continue current statin/zetia for now, declines change, for f/u lipids today

## 2021-07-26 NOTE — Assessment & Plan Note (Signed)
Pt interested in self pay cardac CT score - will order

## 2021-07-26 NOTE — Addendum Note (Signed)
Addended by: Susy Manor on: 12/19/6436 11:02 AM   Modules accepted: Orders

## 2021-07-26 NOTE — Assessment & Plan Note (Addendum)
Age and sex appropriate education and counseling updated with regular exercise and diet Referrals for preventative services - pt to see GYn soon for hep c screen Immunizations addressed - for flu shot, declines shingrix, pneumovax Smoking counseling  - none needed Evidence for depression or other mood disorder - anxiety panic - mild worsening recent, declines counseling or SSRI Most recent labs reviewed. I have personally reviewed and have noted: 1) the patient's medical and social history 2) The patient's current medications and supplements 3) The patient's height, weight, and BMI have been recorded in the chart

## 2021-07-26 NOTE — Patient Instructions (Addendum)
We have discussed the Cardiac CT Score test to measure the calcification level (if any) in your heart arteries.  This test has been ordered in our [place], so please call [facility] directly, as they prefer this, at [phone] to be scheduled.  Your EKG was done today  Please continue all other medications as before, and refills have been done if requested.  Please have the pharmacy call with any other refills you may need.  Please continue your efforts at being more active, low cholesterol diet, and weight control.  You are otherwise up to date with prevention measures today.  Please keep your appointments with your specialists as you may have planned - Dr Debara Pickett as you mentioned  You will be contacted regarding the referral for: [facility]  Please go to the LAB at the blood drawing area for the tests to be done  You will be contacted by phone if any changes need to be made immediately.  Otherwise, you will receive a letter about your results with an explanation, but please check with MyChart first.  Please remember to sign up for MyChart if you have not done so, as this will be important to you in the future with finding out test results, communicating by private email, and scheduling acute appointments online when needed.  Please make an Appointment to return in 6 months, or sooner if needed

## 2021-07-26 NOTE — Assessment & Plan Note (Signed)
Lab Results  Component Value Date   HGBA1C 5.8 01/25/2021   Stable, pt to continue current medical treatment  - diet

## 2021-07-26 NOTE — Assessment & Plan Note (Signed)
Mild worsening recently after father's passing, delcines other counseling or med tx at this time

## 2021-07-26 NOTE — Assessment & Plan Note (Signed)
Goal ldl < 70 - for f/u lipids

## 2021-07-26 NOTE — Progress Notes (Addendum)
Patient ID: [name], female   DOB: [birth year], 55 y.o.   MRN: [id number]         Chief Complaint:: wellness exam and Follow-up  HLD, cor calcification by CT, obesity, anxiety/panic       HPI:  [name] is a 55 y.o. female here for wellness exam; plans to call for yearly GYN appt, for hep c screen today, declines shingrix, pneumovax, ok for flu shot, o/w up to date                        Also has hx of Cor Ca+2 by CT 2017, goal LDL < 70 and asks for ECG and lipids today with labs as Dr Hilty/card routine appts are out to mar 2023 now.  Also mentions very difficult to lose wt; has been 140 lbs but since hurt her foot about 8 yrs ago has been less acitve with gradual wt gain up to about 230  Also with incresaed anxiety and panic symptoms since her father passed earlier this yr, though also "very sensitive" to meds and not wanting new med tx today.  Denies worsening depressive symptoms, suicidal ideation,   Pt denies chest pain, increased sob or doe, wheezing, orthopnea, PND, increased LE swelling, palpitations, dizziness or syncope.   Pt denies polydipsia, polyuria, or new focal neuro s/s.   Now taking vit d.   Wt Readings from Last 3 Encounters:  07/26/21 227 lb (103 kg)  06/10/21 210 lb (95.3 kg)  02/01/21 225 lb 2 oz (102.1 kg)   BP Readings from Last 3 Encounters:  07/26/21 112/64  02/01/21 110/72  01/25/21 102/74   Immunization History  Administered Date(s) Administered   Influenza,inj,Quad PF,6+ Mos 07/01/2019, 07/26/2021   PFIZER(Purple Top)SARS-COV-2 Vaccination 11/19/2019, 12/13/2019, 07/09/2020   Pfizer Covid-19 Vaccine Bivalent Booster 93yrs & up 07/08/2021   Td 03/27/2010   Tdap 01/25/2021   There are no preventive care reminders to display for this patient.     Past Medical History:  Diagnosis Date   ANXIETY 03/27/2010   BACK PAIN, CHRONIC 03/27/2010   Classic migraine with aura 05/30/2015   COMMON MIGRAINE 03/27/2010   [place] DISEASE, CERVICAL 03/27/2010    FATIGUE 03/27/2010   GERD 03/27/2010   Glaucoma    bilateral   HYPERLIPIDEMIA 03/27/2010   Multiple sclerosis ([place]) 10/23/2010   unconfirmed, but suspected   Myocardial infarction [facility])    "old"   Osteoporosis 09/24/2012   Unspecified vitamin D deficiency 10/23/2010   Past Surgical History:  Procedure Laterality Date   ABDOMINAL HYSTERECTOMY     LEFT HEART CATHETERIZATION WITH CORONARY ANGIOGRAM N/A 03/31/2014   Procedure: LEFT HEART CATHETERIZATION WITH CORONARY ANGIOGRAM;  Surgeon: Pixie Casino, MD;  Location: [facility] CATH LAB;  Service: Cardiovascular;  Laterality: N/A;   Neg stress test  2007   s/p eye surgury     s/p knee surgury      reports that she has never smoked. She has never used smokeless tobacco. She reports that she does not drink alcohol and does not use drugs. family history includes Arthritis in an other family member; Cancer in her mother and another family member; Colon polyps in her father and mother; Dementia in an other family member; Diabetes in her father and another family member; Heart disease in her father and another family member; Hyperlipidemia in her father and mother; Hypertension in her father; Lupus in her cousin and maternal aunt. Allergies  Allergen Reactions  Topamax Other (See Comments)    dizziness   Current Outpatient Medications on File Prior to Visit  Medication Sig Dispense Refill   alendronate (FOSAMAX) 70 MG tablet Take 1 tablet (70 mg total) by mouth every 7 (seven) days. Take with a full glass of water on an empty stomach. 13 tablet 3   aspirin 81 MG EC tablet Take 1 tablet (81 mg total) by mouth daily. Swallow whole. 30 tablet 12   brimonidine (ALPHAGAN P) 0.1 % SOLN Place 1 drop into both eyes 2 (two) times daily.     ezetimibe-simvastatin (VYTORIN) 10-40 MG tablet Take 1 tablet by mouth daily. KEEP OV. 30 tablet 4   timolol (TIMOPTIC) 0.5 % ophthalmic solution 1 drop 2 (two) times daily.     No current facility-administered medications  on file prior to visit.        ROS:  All others reviewed and negative.  Objective        PE:  BP 112/64 (BP Location: Right Arm, Patient Position: Sitting, Cuff Size: Large)   Pulse 76   Temp 98.6 F (37 C) (Oral)   Ht 5\' 4"  (1.626 m)   Wt 227 lb (103 kg)   SpO2 100%   BMI 38.96 kg/m                 Constitutional: Pt appears in NAD               HENT: Head: NCAT.                Right Ear: External ear normal.                 Left Ear: External ear normal.                Eyes: . Pupils are equal, round, and reactive to light. Conjunctivae and EOM are normal               Nose: without d/c or deformity               Neck: Neck supple. Gross normal ROM               Cardiovascular: Normal rate and regular rhythm.                 Pulmonary/Chest: Effort normal and breath sounds without rales or wheezing.                Abd:  Soft, NT, ND, + BS, no organomegaly               Neurological: Pt is alert. At baseline orientation, motor grossly intact               Skin: Skin is warm. No rashes, no other new lesions, LE edema - none               Psychiatric: Pt behavior is normal without agitation   Micro: none  Cardiac tracings I have personally interpreted today:  ECG - NSR 72  Pertinent Radiological findings (summarize): none   Lab Results  Component Value Date   WBC 6.0 01/25/2021   HGB 13.6 01/25/2021   HCT 40.9 01/25/2021   PLT 268.0 01/25/2021   GLUCOSE 77 01/25/2021   CHOL 163 01/25/2021   TRIG 74.0 01/25/2021   HDL 49.00 01/25/2021   LDLDIRECT 184.9 08/04/2012   LDLCALC 99 01/25/2021   ALT 31 01/25/2021   AST 26 01/25/2021   NA  137 01/25/2021   K 4.0 01/25/2021   CL 104 01/25/2021   CREATININE 0.58 01/25/2021   BUN 12 01/25/2021   CO2 25 01/25/2021   TSH 0.55 01/25/2021   INR 0.99 05/24/2015   HGBA1C 5.8 01/25/2021   Assessment/Plan:  [name] is a 55 y.o. Black or African American [2] female with  has a past medical history of ANXIETY (03/27/2010),  BACK PAIN, CHRONIC (03/27/2010), Classic migraine with aura (05/30/2015), COMMON MIGRAINE (03/27/2010), DISC DISEASE, CERVICAL (03/27/2010), FATIGUE (03/27/2010), GERD (03/27/2010), Glaucoma, HYPERLIPIDEMIA (03/27/2010), Multiple sclerosis ([place]) (10/23/2010), Myocardial infarction [facility]), Osteoporosis (09/24/2012), and Unspecified vitamin D deficiency (10/23/2010).  Encounter for well adult exam with abnormal findings Age and sex appropriate education and counseling updated with regular exercise and diet Referrals for preventative services - pt to see GYn soon for hep c screen Immunizations addressed - for flu shot, declines shingrix, pneumovax Smoking counseling  - none needed Evidence for depression or other mood disorder - anxiety panic - mild worsening recent, declines counseling or SSRI Most recent labs reviewed. I have personally reviewed and have noted: 1) the patient's medical and social history 2) The patient's current medications and supplements 3) The patient's height, weight, and BMI have been recorded in the chart   Hyperlipidemia  Uncontrolled, goal ldl < 70,  Lab Results  Component Value Date   CHOL 163 01/25/2021   HDL 49.00 01/25/2021   [place] 99 01/25/2021   LDLDIRECT 184.9 08/04/2012   TRIG 74.0 01/25/2021   CHOLHDL 3 01/25/2021   To continue current statin/zetia for now, declines change, for f/u lipids today   Panic anxiety syndrome Mild worsening recently after father's passing, delcines other counseling or med tx at this time  Vitamin D deficiency Last vitamin D Lab Results  Component Value Date   VD25OH 39.82 01/25/2021   Stable, cont oral replacement   Coronary artery calcification seen on CT scan Pt interested in self pay cardac CT score - will order  Familial hypercholesterolemia Goal ldl < 70 - for f/u lipids  Hyperglycemia Lab Results  Component Value Date   HGBA1C 5.8 01/25/2021   Stable, pt to continue current medical treatment  -  diet  Followup: Return in about 6 months (around 01/23/2022).  Cathlean Cower, MD 07/26/2021 10:53 AM [facility]

## 2021-07-29 LAB — HEPATITIS C ANTIBODY
Hepatitis C Ab: NONREACTIVE
SIGNAL TO CUT-OFF: 0.05 (ref <1.00)

## 2021-08-09 ENCOUNTER — Ambulatory Visit: Payer: PRIVATE HEALTH INSURANCE | Admitting: Internal Medicine

## 2021-08-30 ENCOUNTER — Ambulatory Visit: Payer: PRIVATE HEALTH INSURANCE | Admitting: Internal Medicine

## 2021-09-25 ENCOUNTER — Ambulatory Visit: Payer: PRIVATE HEALTH INSURANCE | Admitting: Internal Medicine

## 2021-10-25 ENCOUNTER — Other Ambulatory Visit: Payer: Self-pay

## 2021-10-25 ENCOUNTER — Ambulatory Visit (INDEPENDENT_AMBULATORY_CARE_PROVIDER_SITE_OTHER): Payer: PRIVATE HEALTH INSURANCE | Admitting: Internal Medicine

## 2021-10-25 ENCOUNTER — Encounter: Payer: Self-pay | Admitting: Internal Medicine

## 2021-10-25 VITALS — BP 122/80 | HR 69 | Ht 64.0 in | Wt 242.0 lb

## 2021-10-25 DIAGNOSIS — M810 Age-related osteoporosis without current pathological fracture: Secondary | ICD-10-CM

## 2021-10-25 LAB — VITAMIN D 25 HYDROXY (VIT D DEFICIENCY, FRACTURES): VITD: 36.82 ng/mL (ref 30.00–100.00)

## 2021-10-25 LAB — BASIC METABOLIC PANEL
BUN: 9 mg/dL (ref 6–23)
CO2: 31 mEq/L (ref 19–32)
Calcium: 9.1 mg/dL (ref 8.4–10.5)
Chloride: 104 mEq/L (ref 96–112)
Creatinine, Ser: 0.59 mg/dL (ref 0.40–1.20)
GFR: 100.85 mL/min (ref >60.00)
Glucose, Bld: 87 mg/dL (ref 70–99)
Potassium: 4.7 mEq/L (ref 3.5–5.1)
Sodium: 138 mEq/L (ref 135–145)

## 2021-10-25 LAB — TSH: TSH: 1.09 u[IU]/mL (ref 0.35–5.50)

## 2021-10-25 LAB — T4, FREE: Free T4: 0.85 ng/dL (ref 0.60–1.60)

## 2021-10-25 MED ORDER — ALENDRONATE SODIUM 70 MG PO TABS: 70.0000 mg | ORAL_TABLET | ORAL | 3 refills | Status: DC

## 2021-10-25 NOTE — Progress Notes (Signed)
Name: [name]  MRN/ DOB: [id number], 13-Apr-1966    Age/ Sex: 56 y.o., female    PCP: Corwin Levins, MD   Reason for Endocrinology Evaluation: Osteoporosis      Date of Initial Endocrinology Evaluation: 11/04/2020    HPI: [name] is a 56 y.o. female with a past medical history of Dyslipidemia, Hx of infarct on stress test ,  and Osteoporosis . The patient presented for initial endocrinology clinic visit on 11/04/2020  for consultative assistance with her Osteoporosis .   Pt was diagnosed with osteoporosis: 2013  with a Tscore of -2.6 at the femoral neck   Menarche at age : 81 Menopausal at age : S/P hysterectomy  Around age late 66's Fracture Hx: Compression spinal vertebrae on Xray  Hx of HRT: no  FH of osteoporosis or hip fracture: no Prior Hx of anti-estrogenic therapy : no Prior Hx of anti-resorptive therapy : I have offered her prolia in 01/2021 but she declined due to her concerns about hyperlipidemia. She declined anabolic agents due to concerns about side effects. Started Alendronate 01/2021   24-hr urinary cortisol was normal 02/2021 24-hr urinary calcium was normal 148 mg 02/2021 SUBJECTIVE:    Today (10/25/21):  [name] is here for a follow up on osteoporosis.   She denies any hearburn or any abdominal pain  Denies constipation or diarrhea  No recent fractures   She is on Vitamin D 3000 iu daily  Calcium 1200 mg daily     HISTORY:  Past Medical History:  Past Medical History:  Diagnosis Date   ANXIETY 03/27/2010   BACK PAIN, CHRONIC 03/27/2010   Classic migraine with aura 05/30/2015   COMMON MIGRAINE 03/27/2010   DISC DISEASE, CERVICAL 03/27/2010   FATIGUE 03/27/2010   GERD 03/27/2010   Glaucoma    bilateral   HYPERLIPIDEMIA 03/27/2010   Multiple sclerosis ([facility]) 10/23/2010   unconfirmed, but suspected   Myocardial infarction [facility])    "old"   Osteoporosis 09/24/2012   Unspecified vitamin D deficiency 10/23/2010   Past Surgical History:   Past Surgical History:  Procedure Laterality Date   ABDOMINAL HYSTERECTOMY     LEFT HEART CATHETERIZATION WITH CORONARY ANGIOGRAM N/A 03/31/2014   Procedure: LEFT HEART CATHETERIZATION WITH CORONARY ANGIOGRAM;  Surgeon: Chrystie Nose, MD;  Location: [facility] CATH LAB;  Service: Cardiovascular;  Laterality: N/A;   Neg stress test  2007   s/p eye surgury     s/p knee surgury      Social History:  reports that she has never smoked. She has never used smokeless tobacco. She reports that she does not drink alcohol and does not use drugs. Family History: family history includes Arthritis in an other family member; Cancer in her mother and another family member; Colon polyps in her father and mother; Dementia in an other family member; Diabetes in her father and another family member; Heart disease in her father and another family member; Hyperlipidemia in her father and mother; Hypertension in her father; Lupus in her cousin and maternal aunt.   HOME MEDICATIONS: Allergies as of 10/25/2021       Reactions   Topamax Other (See Comments)   dizziness        Medication List        Accurate as of October 25, 2021  7:45 AM. If you have any questions, ask your nurse or doctor.          alendronate 70 MG tablet Commonly  known as: FOSAMAX Take 1 tablet (70 mg total) by mouth every 7 (seven) days. Take with a full glass of water on an empty stomach.   aspirin 81 MG EC tablet Take 1 tablet (81 mg total) by mouth daily. Swallow whole.   brimonidine 0.1 % Soln Commonly known as: ALPHAGAN P Place 1 drop into both eyes 2 (two) times daily.   ezetimibe-simvastatin 10-40 MG tablet Commonly known as: VYTORIN Take 1 tablet by mouth daily. KEEP OV.   timolol 0.5 % ophthalmic solution Commonly known as: TIMOPTIC 1 drop 2 (two) times daily.          REVIEW OF SYSTEMS: A comprehensive ROS was conducted with the patient and is negative except as per HPI and below:  Review of Systems   Gastrointestinal:  Negative for diarrhea, heartburn and nausea.  Musculoskeletal:  Positive for joint pain.  Neurological:  Negative for tingling.      OBJECTIVE:  VS: There were no vitals taken for this visit.   Wt Readings from Last 3 Encounters:  07/26/21 227 lb (103 kg)  06/10/21 210 lb (95.3 kg)  02/01/21 225 lb 2 oz (102.1 kg)     EXAM: General: Pt appears well and is in NAD  Neck: General: Supple without adenopathy. Thyroid: Thyroid size normal.  No goiter or nodules appreciated. No thyroid bruit.  Lungs: Clear with good BS bilat with no rales, rhonchi, or wheezes  Heart: Auscultation: RRR.  Abdomen: Normoactive bowel sounds, soft, nontender, without masses or organomegaly palpable  Extremities:  BL LE: No pretibial edema normal ROM and strength.  Skin: Hair: Texture and amount normal with gender appropriate distribution Skin Inspection: No rashes Skin Palpation: Skin temperature, texture, and thickness normal to palpation  Neuro: Cranial nerves: II - XII grossly intact  Motor: Normal strength throughout DTRs: 2+ and symmetric in UE without delay in relaxation phase  Mental Status: Judgment, insight: Intact Memory: Intact for recent and remote events Mood and affect: No depression, anxiety, or agitation     DATA REVIEWED:   Latest Reference Range & Units 10/25/21 11:25  Sodium 135 - 145 mEq/L 138  Potassium 3.5 - 5.1 mEq/L 4.7  Chloride 96 - 112 mEq/L 104  CO2 19 - 32 mEq/L 31  Glucose 70 - 99 mg/dL 87  BUN 6 - 23 mg/dL 9  Creatinine 1.61 - 0.96 mg/dL 0.45  Calcium 8.4 - 40.9 mg/dL 9.1  GFR >81.19 mL/min 100.85    Latest Reference Range & Units 10/25/21 11:25  VITD 30.00 - 100.00 ng/mL 36.82    Latest Reference Range & Units 10/25/21 11:25  TSH 0.35 - 5.50 uIU/mL 1.09  T4,Free(Direct) 0.60 - 1.60 ng/dL 1.47      DXA 04/09/9561   Results:   Lumbar spine L1-L4 Femoral neck (FN)  T-score -2.4 RFN: -2.4 LFN: -2.7  Change in BMD from previous DXA  test (%) down 13% Up 1.5%  (*) statistically significant   Assessment: Patient has OSTEOPOROSIS according to the [facility] classification for osteoporosis (see below).   Thoracic Spine X-Ray 01/25/21   Old mild superior endplate compression deformities are seen involving the T9 and T10 vertebral bodies, which are stable since prior exam. No evidence of acute thoracic spine fracture or subluxation. No focal lytic or sclerotic bone lesions identified. Anterior vertebral osteophytosis is noted at T11-12. Intervertebral disc spaces are maintained. Mild lower thoracic dextroscoliosis is also stable since prior study.   IMPRESSION: No acute findings.   Old mild T9 and T10  vertebral body compression deformities and mild lower thoracic dextroscoliosis. ASSESSMENT/PLAN/RECOMMENDATIONS:   Osteoporosis:  - Pt has an old compression fracture of the thoracic spine based on that information I have recommended Evenity or Forteo/Tymlos. But she declined these due to family hx of strokes, CAD and cancer, despite my assurance she is low risk.  - She declined Prolia due to er concerns of hyperlipidemia  - Emphasized the importance of calcium and vitamin D intake as well as weight bearing exercises  -Tolerating alendronate, no changes -BMP vitamin D normal today   Medications : Vitamin D 3000 iu daily  Calcium 1200 mg daily  Alendronate 70 mg weekly   2. Weight gain:   - She is concerned about weight gain, she gets tired and stops exercising, she has tried diets in the past but gives up when they don;t work  - I have encouraged her to follow a certain diet such as weight watchers or noom etc and to stay consistent     F/U in 1 yr   Signed electronically by: Lyndle Herrlich, MD  [facility] Endocrinology  [facility] [address] [place] [place], [place] [address] Phone: [phone] FAX: [phone]   CC: Samhitha, Gabrielson, MD [address] [place]  [address] Phone: [phone] Fax: [phone]   Return to Endocrinology clinic as below: Future Appointments  Date Time Provider Department Center  10/25/2021 11:10 AM Orie Cuttino, Konrad Dolores, MD [facility] None  11/12/2021  8:00 AM Rankin, Alford Highland, MD [facility] None  12/05/2021  8:45 AM Chrystie Nose, MD [facility]  01/23/2022 10:00 AM Corwin Levins, MD [facility] None

## 2021-10-25 NOTE — Patient Instructions (Addendum)
Continue Fosamax 70 mg weekly Continue Calcium 1200 mg daily  Continue Vitamin D 3000 iu daily

## 2021-10-28 LAB — PARATHYROID HORMONE, INTACT (NO CA): PTH: 90 pg/mL — ABNORMAL HIGH (ref 16–77)

## 2021-11-12 ENCOUNTER — Other Ambulatory Visit: Payer: Self-pay

## 2021-11-12 ENCOUNTER — Encounter (INDEPENDENT_AMBULATORY_CARE_PROVIDER_SITE_OTHER): Payer: Self-pay | Admitting: Ophthalmology

## 2021-11-12 ENCOUNTER — Ambulatory Visit (INDEPENDENT_AMBULATORY_CARE_PROVIDER_SITE_OTHER): Payer: PRIVATE HEALTH INSURANCE | Admitting: Ophthalmology

## 2021-11-12 DIAGNOSIS — H3322 Serous retinal detachment, left eye: Secondary | ICD-10-CM | POA: Diagnosis not present

## 2021-11-12 DIAGNOSIS — H4421 Degenerative myopia, right eye: Secondary | ICD-10-CM

## 2021-11-12 DIAGNOSIS — H4422 Degenerative myopia, left eye: Secondary | ICD-10-CM

## 2021-11-12 NOTE — Assessment & Plan Note (Signed)
Underlying cause of previous retinal detachment peripherally and recurrence nasally.  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

## 2021-11-12 NOTE — Progress Notes (Signed)
11/12/2021     CHIEF COMPLAINT Patient presents for  Chief Complaint  Patient presents with   Retina Follow Up      HISTORY OF PRESENT ILLNESS: [name] is a 56 y.o. female who presents to the clinic today for:   HPI     Retina Follow Up           Diagnosis: Retinal Break/Detachment   Laterality: left eye   Onset: 6   Severity: mild   Duration: 6         Comments   6 mos fu ou oct fp. Patient states vision is stable and unchanged since last visit. Denies any new floaters or FOL. Pt is using brimonidine and timolol BID OU.      Last edited by Laurin Coder on 11/12/2021  7:55 AM.      Referring physician: Biagio Borg, MD [place],  [place] [address]  HISTORICAL INFORMATION:   Selected notes from the MEDICAL RECORD NUMBER    Lab Results  Component Value Date   HGBA1C 5.8 07/26/2021     CURRENT MEDICATIONS: Current Outpatient Medications (Ophthalmic Drugs)  Medication Sig   brimonidine (ALPHAGAN P) 0.1 % SOLN Place 1 drop into both eyes 2 (two) times daily.   timolol (TIMOPTIC) 0.5 % ophthalmic solution 1 drop 2 (two) times daily.   No current facility-administered medications for this visit. (Ophthalmic Drugs)   Current Outpatient Medications (Other)  Medication Sig   alendronate (FOSAMAX) 70 MG tablet Take 1 tablet (70 mg total) by mouth every 7 (seven) days. Take with a full glass of water on an empty stomach.   aspirin 81 MG EC tablet Take 1 tablet (81 mg total) by mouth daily. Swallow whole.   ezetimibe-simvastatin (VYTORIN) 10-40 MG tablet Take 1 tablet by mouth daily. KEEP OV.   No current facility-administered medications for this visit. (Other)      REVIEW OF SYSTEMS: ROS   Negative for: Constitutional, Gastrointestinal, Neurological, Skin, Genitourinary, Musculoskeletal, HENT, Endocrine, Cardiovascular, Eyes, Respiratory, Psychiatric, Allergic/Imm, Heme/Lymph Last edited by Hurman Horn, MD on 11/12/2021  8:41  AM.       ALLERGIES Allergies  Allergen Reactions   Topamax Other (See Comments)    dizziness    PAST MEDICAL HISTORY Past Medical History:  Diagnosis Date   ANXIETY 03/27/2010   BACK PAIN, CHRONIC 03/27/2010   Classic migraine with aura 05/30/2015   COMMON MIGRAINE 03/27/2010   [place] DISEASE, CERVICAL 03/27/2010   FATIGUE 03/27/2010   GERD 03/27/2010   Glaucoma    bilateral   HYPERLIPIDEMIA 03/27/2010   Multiple sclerosis ([place]) 10/23/2010   unconfirmed, but suspected   Myocardial infarction [facility])    "old"   Osteoporosis 09/24/2012   Unspecified vitamin D deficiency 10/23/2010   Past Surgical History:  Procedure Laterality Date   ABDOMINAL HYSTERECTOMY     LEFT HEART CATHETERIZATION WITH CORONARY ANGIOGRAM N/A 03/31/2014   Procedure: LEFT HEART CATHETERIZATION WITH CORONARY ANGIOGRAM;  Surgeon: Pixie Casino, MD;  Location: [facility] CATH LAB;  Service: Cardiovascular;  Laterality: N/A;   Neg stress test  2007   s/p eye surgury     s/p knee surgury      FAMILY HISTORY Family History  Problem Relation Age of Onset   Heart disease Father    Diabetes Father    Hyperlipidemia Father    Hypertension Father    Colon polyps Father    Cancer Mother  breast   Hyperlipidemia Mother    Colon polyps Mother    Diabetes Other    Arthritis Other    Dementia Other    Cancer Other        2 uncles with lung cancer   Heart disease Other        2 uncles with heart disease   Lupus Maternal Aunt    Lupus Cousin        mother and father's side   Colon cancer Neg Hx    Esophageal cancer Neg Hx    Stomach cancer Neg Hx    Rectal cancer Neg Hx     SOCIAL HISTORY Social History   Tobacco Use   Smoking status: Never   Smokeless tobacco: Never  Vaping Use   Vaping Use: Never used  Substance Use Topics   Alcohol use: No    Alcohol/week: 0.0 standard drinks   Drug use: No         OPHTHALMIC EXAM:  Base Eye Exam     Visual Acuity (ETDRS)       Right Left   Dist [place]  20/200 -1    Dist cc  20/40 -1   Dist ph [place] 20/160    Dist ph cc  NI    Correction: Glasses  Pt wears readers for left eye vision.        Tonometry (Tonopen, 8:01 AM)       Right Left   Pressure 20 14         Pupils       Dark Light Shape React APD   Right 6 6 Irregular Minimal None   Left 5 4 Round Brisk None         Visual Fields       Left Right   Restrictions Partial outer superior temporal, inferior temporal, superior nasal deficiencies Total inferior nasal deficiency         Extraocular Movement       Right Left    Full Full         Neuro/Psych     Oriented x3: Yes   Mood/Affect: Normal         Dilation     Both eyes: 1.0% Mydriacyl, 2.5% Phenylephrine @ 8:01 AM           Slit Lamp and Fundus Exam     External Exam       Right Left   External Normal Normal         Slit Lamp Exam       Right Left   Lids/Lashes Normal Normal   Conjunctiva/Sclera White and quiet White and quiet   Cornea Clear Clear   Anterior Chamber Deep and quiet AC deep, no emulsification of oil   Iris Round and reactive Round and reactive   Lens Aphakia Centered posterior chamber intraocular lens   Anterior Vitreous Clear Silicone oil clear, no emulsification         Fundus Exam       Right Left   Posterior Vitreous Clear vitrectomized Silicone oil clear   Disc Tilted cup, Tilted disc, posterior staphyloma to the sclera Tilted cup, Tilted disc posterior staphyloma to the sclera   C/D Ratio 0.55 0.3   Macula Myopic macular degeneration Myopic macular degeneration   Vessels Normal Normal   Periphery Good chorioretinal scarring 360, no retinal tears or detachment, incidental note of white lines of nonperfusion of the retina at the equator and anteriorly Centered between  2 and 5:00 meridians nasally Old, traction retinal detachment at an anterior to the equator inferonasal and nasal, stable over years            IMAGING AND PROCEDURES  Imaging  and Procedures for 11/12/21  OCT, Retina - OU - Both Eyes       Right Eye Quality was borderline. Scan locations included subfoveal. Progression has been stable. Findings include myopic contour.   Left Eye Quality was borderline. Scan locations included subfoveal. Progression has been stable. Findings include subretinal hyper-reflective material, myopic contour.   Notes Poor view OS through the silicone oil and high myopia.  No active disease  OD, diffuse macular atrophy     Color Fundus Photography Optos - OU - Both Eyes       Right Eye Progression has been stable. Disc findings include normal observations, increased cup to disc ratio. Macula : geographic atrophy. Vessels : normal observations. Periphery : normal observations.   Left Eye Progression has been stable. Macula : geographic atrophy.   Notes OS with clear silicone, retina attached.  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  OD, acuity limited by geographic atrophy of myopic macular degeneration.  Slight posterior staphyloma no active disease.  Peripheral chorioretinal scarring and laser retinopexy, retina attached.  Continue to monitor nasally, anterior retinal nonperfused between 2 and 5:00 meridians, clear media however             ASSESSMENT/PLAN:  Left degenerative progressive high myopia Underlying cause of previous retinal detachment peripherally and recurrence nasally.  Low lying chronic retinal detachment nasally well demarcated with laser chorioretinal pexy  Partial retinal detachment, left Stable over time, OS,  Patient continues with silicone oil in the left eye with excellent acuity.  Monocular visual status patient is doing well with no signs of complications with no oral emulsification.  Patient continues on oral vitamin B complex supplement     ICD-10-CM   1. Right  degenerative progressive high myopia  H44.21 OCT, Retina - OU - Both Eyes    Color Fundus Photography Optos - OU - Both Eyes    2. Partial retinal detachment, left  H33.22 OCT, Retina - OU - Both Eyes    Color Fundus Photography Optos - OU - Both Eyes    3. Left degenerative progressive high myopia  H44.22       1.  OS, highly myopic, with history of detachment, peripherally, recurrence, PVR, now with this over 6 years of retained silicone oil.  With excellent visual acuity functioning, and this monocular state.  Patient does continue on oral vitamin B complex therapy which in my experience prevents the occurrence we see often of long-term retained silicone oil intravitreal he of sudden vision loss which I attribute to the potential lack of diffusion of water-soluble vitamins into the vitreous cavity which could lead to RNFL atrophy notably vitamin B complex, specifically vitamin B1, B6, B12 and folate  2.  Patient stands left eye during her lifetime likely to develop some complications of silicone oil which are noted and can be found at the slit-lamp or it examination such as emulsification or worsening of secondary glaucoma which would then require vitrectomy oil removal.  Which I believe that this time the left eye would remain a attached centrally  3.  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      Return in about 6 months (around 05/15/2022) for DILATE OU, COLOR FP, OCT.  There are no Patient Instructions on file for this visit.   Explained the diagnoses, plan, and follow up with the patient and they expressed understanding.  Patient expressed  understanding of the importance of proper follow up care.   Clent Demark Teanna Elem M.D. Diseases & Surgery of the Retina and Vitreous [facility]ina [facility] [place] 11/12/21     Abbreviations: M myopia (nearsighted); A astigmatism; H hyperopia (farsighted); P presbyopia; Mrx spectacle prescription;  CTL contact lenses; OD right eye; OS left eye; OU both eyes  XT exotropia; ET esotropia; PEK punctate epithelial keratitis; PEE punctate epithelial erosions; DES dry eye syndrome; MGD meibomian gland dysfunction; ATs artificial tears; PFAT's preservative free artificial tears; [place] nuclear sclerotic cataract; PSC posterior subcapsular cataract; ERM epi-retinal membrane; PVD posterior vitreous detachment; RD retinal detachment; DM diabetes mellitus; DR diabetic retinopathy; NPDR non-proliferative diabetic retinopathy; PDR proliferative diabetic retinopathy; CSME clinically significant macular edema; DME diabetic macular edema; dbh dot blot hemorrhages; CWS cotton wool spot; POAG primary open angle glaucoma; C/D cup-to-disc ratio; HVF humphrey visual field; GVF goldmann visual field; OCT optical coherence tomography; IOP intraocular pressure; BRVO Branch retinal vein occlusion; CRVO central retinal vein occlusion; CRAO central retinal artery occlusion; BRAO branch retinal artery occlusion; RT retinal tear; SB scleral buckle; PPV pars plana vitrectomy; VH Vitreous hemorrhage; PRP panretinal laser photocoagulation; IVK intravitreal kenalog; VMT vitreomacular traction; MH Macular hole;  NVD neovascularization of the disc; NVE neovascularization elsewhere; AREDS age related eye disease study; ARMD age related macular degeneration; POAG primary open angle glaucoma; EBMD epithelial/anterior basement membrane dystrophy; ACIOL anterior chamber intraocular lens; IOL intraocular lens; PCIOL posterior chamber intraocular lens; Phaco/IOL phacoemulsification with intraocular lens placement; [place] photorefractive keratectomy; LASIK laser  assisted in situ keratomileusis; HTN hypertension; DM diabetes mellitus; COPD chronic obstructive pulmonary disease

## 2021-11-12 NOTE — Assessment & Plan Note (Signed)
Stable over time, OS, ? ?Patient continues with silicone oil in the left eye with excellent acuity.  Monocular visual status patient is doing well with no signs of complications with no oral emulsification. ? ?Patient continues on oral vitamin B complex supplement ?

## 2021-12-05 ENCOUNTER — Ambulatory Visit (INDEPENDENT_AMBULATORY_CARE_PROVIDER_SITE_OTHER): Payer: PRIVATE HEALTH INSURANCE | Admitting: Internal Medicine

## 2021-12-05 ENCOUNTER — Encounter: Payer: Self-pay | Admitting: Internal Medicine

## 2021-12-05 ENCOUNTER — Telehealth: Payer: Self-pay | Admitting: *Deleted

## 2021-12-05 VITALS — BP 100/78 | HR 86 | Ht 64.0 in | Wt 239.0 lb

## 2021-12-05 DIAGNOSIS — E669 Obesity, unspecified: Secondary | ICD-10-CM

## 2021-12-05 DIAGNOSIS — R5383 Other fatigue: Secondary | ICD-10-CM

## 2021-12-05 DIAGNOSIS — E782 Mixed hyperlipidemia: Secondary | ICD-10-CM

## 2021-12-05 DIAGNOSIS — R400 Somnolence: Secondary | ICD-10-CM

## 2021-12-05 DIAGNOSIS — I2510 Atherosclerotic heart disease of native coronary artery without angina pectoris: Secondary | ICD-10-CM

## 2021-12-05 DIAGNOSIS — I2584 Coronary atherosclerosis due to calcified coronary lesion: Secondary | ICD-10-CM

## 2021-12-05 NOTE — Telephone Encounter (Signed)
Patient notified of HST appointment details. °

## 2021-12-05 NOTE — Patient Instructions (Signed)
Medication Instructions:  ?NO CHANGES ? ?*If you need a refill on your cardiac medications before your next appointment, please call your pharmacy* ? ? ?Lab Work: ?FASTING labs in 6 months -- before next appointment ?-- lipid panel, CMET ? ?If you have labs (blood work) drawn today and your tests are completely normal, you will receive your results only by: ?MyChart Message (if you have MyChart) OR ?A paper copy in the mail ?If you have any lab test that is abnormal or we need to change your treatment, we will call you to review the results. ? ? ?Testing/Procedures: ?Home Sleep Test ? ? ?Follow-Up: ?At [facility], you and your health needs are our priority.  As part of our continuing mission to provide you with exceptional heart care, we have created designated Provider Care Teams.  These Care Teams include your primary Cardiologist (physician) and Advanced Practice Providers (APPs -  Physician Assistants and Nurse Practitioners) who all work together to provide you with the care you need, when you need it. ? ?We recommend signing up for the patient portal called "MyChart".  Sign up information is provided on this After Visit Summary.  MyChart is used to connect with patients for Virtual Visits (Telemedicine).  Patients are able to view lab/test results, encounter notes, upcoming appointments, etc.  Non-urgent messages can be sent to your provider as well.   ?To learn more about what you can do with MyChart, go to [email or web address].   ? ?Your next appointment:   ?6 month(s) ? ?The format for your next appointment:   ?In Person ? ?Provider:   ?Dr. Debara Pickett ? ?Other Instructions ?You have been referred to [facility] ? ? ?

## 2021-12-05 NOTE — Progress Notes (Signed)
? ? ?OFFICE NOTE ? ?Chief Complaint:  ?Fatigue, somnolence ? ?Primary Care Physician: ?Biagio Borg, MD ? ?HPI:  ?[name] is a pleasant 56 year old female who has been suffering with symptoms of chest pain and palpitations for quite some time. Apparently in 2008 she had an episode of chest pain and was admitted to the hospital. She was treated and ruled out and continued to have very intermittent episodes of discomfort over the years. Recently she had some worsening symptoms including chest discomfort and feeling of palpitations. This is followed by significant fatigue and lack of energy for several days after the palpitations have resolved. It is associated with heaviness in the chest that is behind the sternum. It does not radiate to the arm or to the jaw. Sometimes the symptoms are worse when she is exerting herself and improve at rest. Other times they occur at rest. She had recently seen Dr. Terrence Dupont, for a cardiac opinion. He ordered a stress test which was performed at [facility]. 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 No evidence for inducible ischemia. Normal function with an EF of 66%".  This was interpreted by a Development worker, community.  Apparently she received a call saying her stress test was negative and she was told to followup with him in the office in 3 weeks. When she came back she was allegedly told that her stress test showed a prior heart attack. There does however appear to be conflicting data. Based on this she is seeking another opinion, especially in light of recurrent symptoms. She does report that the palpitation episodes are actually fairly infrequent. She says she's had about 3 episodes of this year and perhaps 2 or 3 episodes last year. ? ?[name] returns today for follow-up. After undergoing noninvasive testing which was  equivocal, there was still some concern for possible coronary ischemia. She underwent definitive cardiac catheterization by myself which demonstrated normal coronary arteries. Based on these findings I'm not suspicious that her chest pain is cardiac. Additionally, she has few risk factors for variant angina or small vessel disease. She does have migraine headaches which could put her at risk for coronary spasm. Treatments for possible coronary spasm include beta blockers, calcium channel blockers or nitrates. She seems to have had problems with low blood pressure in the past and I did not think will tolerate these medicines. She was started on low-dose propranolol for migraines and she said she only took 1 pill and felt horrible with it. Therefore, I do not feel she'll tolerate a beta blocker. Her discomfort in the chest is not lifestyle or activity limiting. ? ?01/22/2016 ? ?[name] was seen today in the office. She was sent back for follow-up due to recent ER visit where she was shown to have artery artery calcification on her CT however her cardiac catheterization 2015 showed angiographically normal coronaries. 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 Recently she had reassessment of her cholesterol by her primary care provider. She had been intolerant of statin therapy, in fact had not tolerated both Lipitor and lovastatin in the past. Her total cholesterol was 305, triglycerides 61, HDL 51 and LDL 241. 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 There is a history of coronary disease in her family. She has early  coronary artery calcifications at age 79 which is considered premature coronary artery disease although she did not have significant obstruction she will need aggressive lipid therapy. Recently she was started on Crestor 20 mg by primary care provider but had side effects from this which  went away after being off of it for a week. She was then switched to simvastatin 20 mg which so far she is tolerating. I did discuss the lipid lowering properties of simvastatin the fact that she needs more aggressive lowering of LDL cholesterol with a goal LDL of at least 70 if not 50 based on new guidelines, which she will never reached on statin therapy alone. Currently she is on max tolerated statin therapy. I like to add said he had 10 mg to her regimen. We'll also refer her to our lipid clinic as she is a good candidate for the PCSK9 inhibitor Repatha. Her calculated DUTCH score is 6 which is suggestive of FH. She also reports some daytime fatigue, somnolence and difficulty sleeping at night. She's been noted to awaken suddenly with gasping concerning for possible sleep apnea. ? ?03/24/2016 ? ?[name] is seen back today in the office. She's had a marked improved prove minute and her cholesterol on Zocor and that he had. Amazingly she's had more than a 50% reduction was suggested there may been a dietary component as well. Although it looks like she may have FH, her cholesterol is fairly well-controlled on her current regimen and therefore do not believe we need to pursue Repatha at this time. Recently she had her sleep study interpreted which indicated no evidence of obstructive sleep apnea. This is reassuring. She continues to exercise but is not seen a major weight reduction. Of encouraged her to keep that up because she is making some progress with regards to her health without question. ? ?11/13/2016 ? ?[name] returns today for follow-up. Overall she seems to be doing well. She denies any chest pain or worsening shortness of breath. She is only on monotherapy simvastatin. What tried her on that area but she reported intolerance to that. She is due for repeat lipid profile. Unfortunately this been re-gain of weight from 205 up to 213 pounds. She says she is to start working at that heart her with more  exercise and dietary changes. She is also complaining of some left neck and left arm pain which is getting somewhat better but it's difficult for her to move her neck to the left. ? ?05/17/2020 ? ?Ms. Tayler is seen today in follow-up.  She continues to do well.  She denies any chest pain or worsening shortness of breath.  She had no coronary disease by heart catheterization performed in 2015.  She was noted however to have some coronary calcification on CT in 2017.  Weight has continued to go up.  Initially she was around 205, then 213 and now 230 pounds.  She reports less activity.  She says that she does get short of breath walking up stairs and doing some activities which I think is weight related.  She not had recent labs since October at which time her lipids were still elevated with total cholesterol of 211, HDL 61, LDL 133 and triglycerides 85.   ? ?12/05/2021 ? ?Ms. Dirks returns today for follow-up.  She repeats feeling some fatigue throughout the day.  Blood pressure was noted to be low at 100/78 today.  I am concerned about the possibility of sleep apnea given her morbid obesity.  We  did discuss some of the associated conditions and symptoms today.  She filled out a sleepiness score which actually was 0.  That being said, she does get a nonrestorative sleep and is often up at night.  She feels tired the next day. ? ?PMHx:  ?Past Medical History:  ?Diagnosis Date  ? ANXIETY 03/27/2010  ? BACK PAIN, CHRONIC 03/27/2010  ? Classic migraine with aura 05/30/2015  ? COMMON MIGRAINE 03/27/2010  ? [place] DISEASE, CERVICAL 03/27/2010  ? FATIGUE 03/27/2010  ? GERD 03/27/2010  ? Glaucoma   ? bilateral  ? HYPERLIPIDEMIA 03/27/2010  ? Multiple sclerosis ([place]) 10/23/2010  ? unconfirmed, but suspected  ? Myocardial infarction [facility])   ? "old"  ? Osteoporosis 09/24/2012  ? Unspecified vitamin D deficiency 10/23/2010  ? ? ?Past Surgical History:  ?Procedure Laterality Date  ? ABDOMINAL HYSTERECTOMY    ? LEFT HEART CATHETERIZATION WITH  CORONARY ANGIOGRAM N/A 03/31/2014  ? Procedure: LEFT HEART CATHETERIZATION WITH CORONARY ANGIOGRAM;  Surgeon: Pixie Casino, MD;  Location: [facility] CATH LAB;  Service: Cardiovascular;  Laterality: N/A;  ? Neg stre

## 2021-12-13 ENCOUNTER — Other Ambulatory Visit: Payer: Self-pay | Admitting: Internal Medicine

## 2022-01-21 ENCOUNTER — Ambulatory Visit (HOSPITAL_BASED_OUTPATIENT_CLINIC_OR_DEPARTMENT_OTHER): Payer: PRIVATE HEALTH INSURANCE | Attending: Internal Medicine | Admitting: Cardiovascular Disease

## 2022-01-21 DIAGNOSIS — E669 Obesity, unspecified: Secondary | ICD-10-CM

## 2022-01-21 DIAGNOSIS — Z6830 Body mass index (BMI) 30.0-30.9, adult: Secondary | ICD-10-CM | POA: Diagnosis not present

## 2022-01-21 DIAGNOSIS — G4733 Obstructive sleep apnea (adult) (pediatric): Secondary | ICD-10-CM

## 2022-01-21 DIAGNOSIS — R400 Somnolence: Secondary | ICD-10-CM

## 2022-01-21 DIAGNOSIS — R5383 Other fatigue: Secondary | ICD-10-CM | POA: Diagnosis not present

## 2022-01-23 ENCOUNTER — Encounter: Payer: Self-pay | Admitting: Internal Medicine

## 2022-01-23 ENCOUNTER — Ambulatory Visit (INDEPENDENT_AMBULATORY_CARE_PROVIDER_SITE_OTHER): Payer: PRIVATE HEALTH INSURANCE | Admitting: Internal Medicine

## 2022-01-23 VITALS — BP 100/68 | HR 59 | Temp 98.4°F | Ht 64.0 in | Wt 243.0 lb

## 2022-01-23 DIAGNOSIS — R350 Frequency of micturition: Secondary | ICD-10-CM | POA: Diagnosis not present

## 2022-01-23 DIAGNOSIS — E559 Vitamin D deficiency, unspecified: Secondary | ICD-10-CM

## 2022-01-23 DIAGNOSIS — E7801 Familial hypercholesterolemia: Secondary | ICD-10-CM | POA: Diagnosis not present

## 2022-01-23 DIAGNOSIS — E538 Deficiency of other specified B group vitamins: Secondary | ICD-10-CM

## 2022-01-23 DIAGNOSIS — Z0001 Encounter for general adult medical examination with abnormal findings: Secondary | ICD-10-CM

## 2022-01-23 DIAGNOSIS — I2510 Atherosclerotic heart disease of native coronary artery without angina pectoris: Secondary | ICD-10-CM

## 2022-01-23 DIAGNOSIS — R739 Hyperglycemia, unspecified: Secondary | ICD-10-CM | POA: Diagnosis not present

## 2022-01-23 DIAGNOSIS — I2584 Coronary atherosclerosis due to calcified coronary lesion: Secondary | ICD-10-CM

## 2022-01-23 LAB — HEPATIC FUNCTION PANEL
ALT: 33 U/L (ref 0–35)
AST: 35 U/L (ref 0–37)
Albumin: 4.1 g/dL (ref 3.5–5.2)
Alkaline Phosphatase: 84 U/L (ref 39–117)
Bilirubin, Direct: 0.1 mg/dL (ref 0.0–0.3)
Total Bilirubin: 0.5 mg/dL (ref 0.2–1.2)
Total Protein: 7.5 g/dL (ref 6.0–8.3)

## 2022-01-23 LAB — URINALYSIS, ROUTINE W REFLEX MICROSCOPIC
Bilirubin Urine: NEGATIVE
Hgb urine dipstick: NEGATIVE
Ketones, ur: NEGATIVE
Leukocytes,Ua: NEGATIVE
Nitrite: NEGATIVE
RBC / HPF: NONE SEEN (ref 0–?)
Specific Gravity, Urine: 1.015 (ref 1.000–1.030)
Total Protein, Urine: NEGATIVE
Urine Glucose: NEGATIVE
Urobilinogen, UA: 0.2 (ref 0.0–1.0)
pH: 8 (ref 5.0–8.0)

## 2022-01-23 LAB — BASIC METABOLIC PANEL
BUN: 8 mg/dL (ref 6–23)
CO2: 28 mEq/L (ref 19–32)
Calcium: 9.2 mg/dL (ref 8.4–10.5)
Chloride: 105 mEq/L (ref 96–112)
Creatinine, Ser: 0.6 mg/dL (ref 0.40–1.20)
GFR: 100.27 mL/min (ref >60.00)
Glucose, Bld: 94 mg/dL (ref 70–99)
Potassium: 4 mEq/L (ref 3.5–5.1)
Sodium: 139 mEq/L (ref 135–145)

## 2022-01-23 LAB — CBC WITH DIFFERENTIAL/PLATELET
Basophils Absolute: 0.1 10*3/uL (ref 0.0–0.1)
Basophils Relative: 0.9 % (ref 0.0–3.0)
Eosinophils Absolute: 0.1 10*3/uL (ref 0.0–0.7)
Eosinophils Relative: 1.5 % (ref 0.0–5.0)
HCT: 39.6 % (ref 36.0–46.0)
Hemoglobin: 13.2 g/dL (ref 12.0–15.0)
Lymphocytes Relative: 40.9 % (ref 12.0–46.0)
Lymphs Abs: 2.4 10*3/uL (ref 0.7–4.0)
MCHC: 33.3 g/dL (ref 30.0–36.0)
MCV: 92.1 fl (ref 78.0–100.0)
Monocytes Absolute: 0.5 10*3/uL (ref 0.1–1.0)
Monocytes Relative: 8 % (ref 3.0–12.0)
Neutro Abs: 2.8 10*3/uL (ref 1.4–7.7)
Neutrophils Relative %: 48.7 % (ref 43.0–77.0)
Platelets: 266 10*3/uL (ref 150.0–400.0)
RBC: 4.3 Mil/uL (ref 3.87–5.11)
RDW: 14.2 % (ref 11.5–15.5)
WBC: 5.8 10*3/uL (ref 4.0–10.5)

## 2022-01-23 LAB — LIPID PANEL
Cholesterol: 155 mg/dL (ref 0–200)
HDL: 55.6 mg/dL (ref >39.00)
LDL Cholesterol: 83 mg/dL (ref 0–99)
NonHDL: 99.76
Total CHOL/HDL Ratio: 3
Triglycerides: 82 mg/dL (ref 0.0–149.0)
VLDL: 16.4 mg/dL (ref 0.0–40.0)

## 2022-01-23 LAB — VITAMIN B12: Vitamin B-12: 1326 pg/mL — ABNORMAL HIGH (ref 211–911)

## 2022-01-23 LAB — HEMOGLOBIN A1C: Hgb A1c MFr Bld: 6 % (ref 4.6–6.5)

## 2022-01-23 LAB — VITAMIN D 25 HYDROXY (VIT D DEFICIENCY, FRACTURES): VITD: 44 ng/mL (ref 30.00–100.00)

## 2022-01-23 LAB — TSH: TSH: 0.76 u[IU]/mL (ref 0.35–5.50)

## 2022-01-23 MED ORDER — CHOLECALCIFEROL 50 MCG (2000 UT) PO TABS: ORAL_TABLET | ORAL | 99 refills | Status: DC

## 2022-01-23 MED ORDER — SOLIFENACIN SUCCINATE 5 MG PO TABS: 5.0000 mg | ORAL_TABLET | Freq: Every day | ORAL | 3 refills | Status: DC

## 2022-01-23 NOTE — Assessment & Plan Note (Signed)
Last vitamin D Lab Results  Component Value Date   VD25OH 36.82 10/25/2021   Low, to start oral replacement

## 2022-01-23 NOTE — Patient Instructions (Signed)
Ok to increase the Vit D3 to 6000 units per day  Please take all new medication as prescribed - the generic for vesicare 5 mg per day to slow down the bladder urgency and frequency  Please continue all other medications as before, and refills have been done if requested.  Please have the pharmacy call with any other refills you may need.  Please continue your efforts at being more active, low cholesterol diet, and weight control.  You are otherwise up to date with prevention measures today.  Please keep your appointments with your specialists as you may have planned  Please go to the LAB at the blood drawing area for the tests to be done  You will be contacted by phone if any changes need to be made immediately.  Otherwise, you will receive a letter about your results with an explanation, but please check with MyChart first.  Please remember to sign up for MyChart if you have not done so, as this will be important to you in the future with finding out test results, communicating by private email, and scheduling acute appointments online when needed.  Please make an Appointment to return in 6 months, or sooner if needed

## 2022-01-23 NOTE — Progress Notes (Signed)
Patient ID: [name], female   DOB: 02-18-1966, 56 y.o.   MRN: [id number]         Chief Complaint:: wellness exam and low vit d, urinary frequency, hld, hyperglycemia       HPI:  [name] is a 56 y.o. female here for wellness exam; decliens shingrix, o/w up to date                Also has 3 days onset urinary freq, and even incontince yesterday. Denies urinary symptoms such as dysuria, urgency, flank pain, hematuria or n/v, fever, chills.  Taking 3000 u Vit d daily.  Pt denies chest pain, increased sob or doe, wheezing, orthopnea, PND, increased LE swelling, palpitations, dizziness or syncope.   Pt denies polydipsia, polyuria, or new focal neuro s/s.    Pt denies fever, wt loss, night sweats, loss of appetite, or other constitutional symptoms  In fact has gained large wt recently with excess calories   Wt Readings from Last 3 Encounters:  01/23/22 243 lb (110.2 kg)  01/21/22 220 lb (99.8 kg)  12/05/21 239 lb (108.4 kg)   BP Readings from Last 3 Encounters:  01/23/22 100/68  12/05/21 100/78  10/25/21 122/80   Immunization History  Administered Date(s) Administered   Influenza,inj,Quad PF,6+ Mos 07/01/2019, 07/26/2021   PFIZER(Purple Top)SARS-COV-2 Vaccination 11/19/2019, 12/13/2019, 07/09/2020   Pfizer Covid-19 Vaccine Bivalent Booster 101yr & up 07/08/2021   Td 03/27/2010   Tdap 01/25/2021   There are no preventive care reminders to display for this patient.     Past Medical History:  Diagnosis Date   ANXIETY 03/27/2010   BACK PAIN, CHRONIC 03/27/2010   Classic migraine with aura 05/30/2015   COMMON MIGRAINE 03/27/2010   [place]   FATIGUE 03/27/2010   GERD 03/27/2010   Glaucoma    bilateral   HYPERLIPIDEMIA 03/27/2010   Multiple sclerosis [place] 10/23/2010   unconfirmed, but suspected   Myocardial infarction ([facility]    "old"   Osteoporosis 09/24/2012   Unspecified vitamin D deficiency 10/23/2010   Past Surgical History:  Procedure Laterality  Date   ABDOMINAL HYSTERECTOMY     LEFT HEART CATHETERIZATION WITH CORONARY ANGIOGRAM N/A 03/31/2014   Procedure: LEFT HEART CATHETERIZATION WITH CORONARY ANGIOGRAM;  Surgeon: KPixie Casino MD;  [facility];  Service: Cardiovascular;  Laterality: N/A;   Neg stress test  2007   s/p eye surgury     s/p knee surgury      reports that she has never smoked. She has never used smokeless tobacco. She reports that she does not drink alcohol and does not use drugs. family history includes Arthritis in an other family member; Cancer in her mother and another family member; Colon polyps in her father and mother; Dementia in an other family member; Diabetes in her father and another family member; Heart disease in her father and another family member; Hyperlipidemia in her father and mother; Hypertension in her father; Lupus in her cousin and maternal aunt. Allergies  Allergen Reactions   Topamax Other (See Comments)    dizziness   Current Outpatient Medications on File Prior to Visit  Medication Sig Dispense Refill   alendronate (FOSAMAX) 70 MG tablet Take 1 tablet (70 mg total) by mouth every 7 (seven) days. Take with a full glass of water on an empty stomach. 13 tablet 3   aspirin 81 MG EC tablet Take 1 tablet (81 mg total) by mouth daily. Swallow whole. 30 tablet  12   brimonidine (ALPHAGAN P) 0.1 % SOLN Place 1 drop into both eyes 2 (two) times daily.     ezetimibe-simvastatin (VYTORIN) 10-40 MG tablet TAKE 1 TABLET BY MOUTH DAILY. KEEP OV. 30 tablet 4   timolol (TIMOPTIC) 0.5 % ophthalmic solution 1 drop 2 (two) times daily.     No current facility-administered medications on file prior to visit.        ROS:  All others reviewed and negative.  Objective        PE:  BP 100/68 (BP Location: Left Arm, Patient Position: Sitting, Cuff Size: Large)   Pulse (!) 59   Temp 98.4 F (36.9 C) (Oral)   Ht '5\' 4"'$  (1.626 m)   Wt 243 lb (110.2 kg)   SpO2 98%   BMI 41.71 kg/m                  Constitutional: Pt appears in NAD               HENT: Head: NCAT.                Right Ear: External ear normal.                 Left Ear: External ear normal.                Eyes: . Pupils are equal, round, and reactive to light. Conjunctivae and EOM are normal               Nose: without d/c or deformity               Neck: Neck supple. Gross normal ROM               Cardiovascular: Normal rate and regular rhythm.                 Pulmonary/Chest: Effort normal and breath sounds without rales or wheezing.                Abd:  Soft, NT, ND, + BS, no organomegaly               Neurological: Pt is alert. At baseline orientation, motor grossly intact               Skin: Skin is warm. No rashes, no other new lesions, LE edema - none               Psychiatric: Pt behavior is normal without agitation   Micro: none  Cardiac tracings I have personally interpreted today:  none  Pertinent Radiological findings (summarize): none   Lab Results  Component Value Date   WBC 5.8 01/23/2022   HGB 13.2 01/23/2022   HCT 39.6 01/23/2022   PLT 266.0 01/23/2022   GLUCOSE 94 01/23/2022   CHOL 155 01/23/2022   TRIG 82.0 01/23/2022   HDL 55.60 01/23/2022   LDLDIRECT 184.9 08/04/2012   LDLCALC 83 01/23/2022   ALT 33 01/23/2022   AST 35 01/23/2022   NA 139 01/23/2022   K 4.0 01/23/2022   CL 105 01/23/2022   CREATININE 0.60 01/23/2022   BUN 8 01/23/2022   CO2 28 01/23/2022   TSH 0.76 01/23/2022   INR 0.99 05/24/2015   HGBA1C 6.0 01/23/2022   Assessment/Plan:  [name] is a 56 y.o. Black or African American [2] female with  has a past medical history of ANXIETY (03/27/2010), BACK PAIN, CHRONIC (03/27/2010), Classic migraine with aura (05/30/2015),  COMMON MIGRAINE (03/27/2010), DISC DISEASE, CERVICAL (03/27/2010), FATIGUE (03/27/2010), GERD (03/27/2010), Glaucoma, HYPERLIPIDEMIA (03/27/2010), Multiple sclerosis ([place]) (10/23/2010), Myocardial infarction [facility]), Osteoporosis (09/24/2012), and Unspecified  vitamin D deficiency (10/23/2010).  Vitamin D deficiency Last vitamin D Lab Results  Component Value Date   VD25OH 36.82 10/25/2021   Low, to start oral replacement   Encounter for well adult exam with abnormal findings Age and sex appropriate education and counseling updated with regular exercise and diet Referrals for preventative services - none needed Immunizations addressed - decliens shingrix Smoking counseling  - none needed Evidence for depression or other mood disorder - none significant Most recent labs reviewed. I have personally reviewed and have noted: 1) the patient's medical and social history 2) The patient's current medications and supplements 3) The patient's height, weight, and BMI have been recorded in the chart   Familial hypercholesterolemia Lab Results  Component Value Date   LDLCALC 83 01/23/2022   Stable, pt to continue current statin vytorin   Hyperglycemia Lab Results  Component Value Date   HGBA1C 6.0 01/23/2022   Stable, pt to continue current medical treatment  - diet    Urinary frequency Exam o/w benign, for UA, but suspect OAB, for vesicare 5 qd  Followup: No follow-ups on file.  Cathlean Cower, MD 01/26/2022 2:27 PM [facility]

## 2022-01-26 DIAGNOSIS — R350 Frequency of micturition: Secondary | ICD-10-CM | POA: Insufficient documentation

## 2022-01-26 NOTE — Assessment & Plan Note (Signed)
Lab Results  Component Value Date   LDLCALC 83 01/23/2022   Stable, pt to continue current statin vytorin

## 2022-01-26 NOTE — Assessment & Plan Note (Signed)
Lab Results  Component Value Date   HGBA1C 6.0 01/23/2022   Stable, pt to continue current medical treatment  - diet

## 2022-01-26 NOTE — Assessment & Plan Note (Signed)
Age and sex appropriate education and counseling updated with regular exercise and diet Referrals for preventative services - none needed Immunizations addressed - decliens shingrix Smoking counseling  - none needed Evidence for depression or other mood disorder - none significant Most recent labs reviewed. I have personally reviewed and have noted: 1) the patient's medical and social history 2) The patient's current medications and supplements 3) The patient's height, weight, and BMI have been recorded in the chart  

## 2022-01-26 NOTE — Assessment & Plan Note (Signed)
Exam o/w benign, for UA, but suspect OAB, for vesicare 5 qd

## 2022-01-30 ENCOUNTER — Telehealth: Payer: Self-pay | Admitting: *Deleted

## 2022-01-30 ENCOUNTER — Encounter (HOSPITAL_BASED_OUTPATIENT_CLINIC_OR_DEPARTMENT_OTHER): Payer: Self-pay | Admitting: Cardiovascular Disease

## 2022-01-30 NOTE — Telephone Encounter (Signed)
Left message to return a call to discuss sleep study results. 

## 2022-01-30 NOTE — Telephone Encounter (Signed)
-----   Message from Troy Sine, MD sent at 01/30/2022  8:56 AM EDT ----- Mariann Laster, please notify pt and set up with DME for Auto-PAP

## 2022-01-30 NOTE — Procedures (Signed)
        Patient Name: [name], [name] Date: 01/21/2022 Gender: Female D.O.B: 29-Jun-1966 Age (years): 55 Referring Provider: Nadean Corwin Hilty Height (inches): 64 Interpreting Physician: Shelva Majestic MD, ABSM Weight (lbs): 220 RPSGT: Jacolyn Reedy BMI: 38 MRN: [id number] Neck Size: 14.00  CLINICAL INFORMATION Sleep Study Type: HST  Indication for sleep study: Excessive Daytime Sleepiness, Fatigue, Morning Headaches, Obesity, Snoring  Epworth Sleepiness Score: 0  Most recent polysomnogram dated 03/14/2016 revealed an AHI of 0.8/h and RDI of 1.6/h.  SLEEP STUDY TECHNIQUE A multi-channel overnight portable sleep study was performed. The channels recorded were: nasal airflow, thoracic respiratory movement, and oxygen saturation with a pulse oximetry. Snoring was also monitored.  MEDICATIONS alendronate (FOSAMAX) 70 MG tablet aspirin 81 MG EC tablet brimonidine (ALPHAGAN P) 0.1 % SOLN Cholecalciferol 50 MCG (2000 UT) TABS ezetimibe-simvastatin (VYTORIN) 10-40 MG tablet solifenacin (VESICARE) 5 MG tablet timolol (TIMOPTIC) 0.5 % ophthalmic solution Patient self administered medications include: VITAMIN D, SIMVASTATIN.  SLEEP ARCHITECTURE Patient was studied for 455.7 minutes. The sleep efficiency was 100.0 % and the patient was supine for 0%. The arousal index was 0.0 per hour.  RESPIRATORY PARAMETERS The overall AHI was 18.4 per hour, with a central apnea index of 0 per hour.  The oxygen nadir was 86% during sleep. Time spent < 89% was 0.5 minutes.  CARDIAC DATA Mean heart rate during sleep was 68.7 bpm. The heart rate range was 55 - 101 bpm.   IMPRESSIONS - Moderate obstructive sleep apnea occurred during this study (AHI 18.4/h). - Mild oxygen desaturation to a  nadir of 86%. - Patient snored for 102.3 minutes (22.4%) during the sleep.  DIAGNOSIS - Obstructive Sleep Apnea (G47.33)  RECOMMENDATIONS - Recommend therapeutic CPAP for treatment of her  moderate sleep disordered breathing. Can initiate a trial of Auto-PAP with EPR of 3 at 7 - 16 cm of water. - Effort should be made to optimize nasal and oropharyngeal patency. - Avoid alcohol, sedatives and other CNS depressants that may worsen sleep apnea and disrupt normal sleep architecture. - Sleep hygiene should be reviewed to assess factors that may improve sleep quality. - Weight management (BMI 38) and regular exercise should be initiated or continued. - Recommend a download and sleep clinic evaluation after 4 weeks of therapy. t   [Electronically signed] 01/30/2022 08:51 AM  Shelva Majestic MD, [facility], [place], American Board of Sleep Medicine  NPI: [id number]  [place] PH: [phone]   FX: [phone]

## 2022-02-04 ENCOUNTER — Encounter: Payer: Self-pay | Admitting: Internal Medicine

## 2022-02-14 NOTE — Telephone Encounter (Signed)
My chart message sent to the patient to call to discuss sleep study results and recommendations.

## 2022-03-06 ENCOUNTER — Telehealth: Payer: Self-pay | Admitting: *Deleted

## 2022-03-06 NOTE — Telephone Encounter (Signed)
Patient returned a call to me to discuss her sleep study results. She agrees to proceed with CPAP therapy. APAP order has been sent to [place]. She was made aware of the supply and demand issue.

## 2022-04-14 DIAGNOSIS — Z0289 Encounter for other administrative examinations: Secondary | ICD-10-CM

## 2022-04-22 ENCOUNTER — Encounter (INDEPENDENT_AMBULATORY_CARE_PROVIDER_SITE_OTHER): Payer: Self-pay | Admitting: Family Medicine

## 2022-04-22 ENCOUNTER — Other Ambulatory Visit (INDEPENDENT_AMBULATORY_CARE_PROVIDER_SITE_OTHER): Payer: Self-pay | Admitting: Nurse Practitioner

## 2022-04-22 ENCOUNTER — Other Ambulatory Visit (INDEPENDENT_AMBULATORY_CARE_PROVIDER_SITE_OTHER): Payer: Self-pay | Admitting: Family Medicine

## 2022-04-22 ENCOUNTER — Ambulatory Visit (INDEPENDENT_AMBULATORY_CARE_PROVIDER_SITE_OTHER): Payer: PRIVATE HEALTH INSURANCE | Admitting: Family Medicine

## 2022-04-22 VITALS — BP 124/85 | HR 73 | Temp 98.0°F | Ht 64.0 in | Wt 242.0 lb

## 2022-04-22 DIAGNOSIS — R5383 Other fatigue: Secondary | ICD-10-CM | POA: Diagnosis not present

## 2022-04-22 DIAGNOSIS — M818 Other osteoporosis without current pathological fracture: Secondary | ICD-10-CM

## 2022-04-22 DIAGNOSIS — Z1331 Encounter for screening for depression: Secondary | ICD-10-CM | POA: Diagnosis not present

## 2022-04-22 DIAGNOSIS — E559 Vitamin D deficiency, unspecified: Secondary | ICD-10-CM

## 2022-04-22 DIAGNOSIS — E538 Deficiency of other specified B group vitamins: Secondary | ICD-10-CM | POA: Diagnosis not present

## 2022-04-22 DIAGNOSIS — E7849 Other hyperlipidemia: Secondary | ICD-10-CM

## 2022-04-22 DIAGNOSIS — R0602 Shortness of breath: Secondary | ICD-10-CM

## 2022-04-22 DIAGNOSIS — E7800 Pure hypercholesterolemia, unspecified: Secondary | ICD-10-CM | POA: Diagnosis not present

## 2022-04-22 DIAGNOSIS — Z6841 Body Mass Index (BMI) 40.0 and over, adult: Secondary | ICD-10-CM

## 2022-04-22 DIAGNOSIS — G4733 Obstructive sleep apnea (adult) (pediatric): Secondary | ICD-10-CM

## 2022-04-22 DIAGNOSIS — Z9189 Other specified personal risk factors, not elsewhere classified: Secondary | ICD-10-CM

## 2022-04-23 LAB — LIPID PANEL WITH LDL/HDL RATIO
Cholesterol, Total: 166 mg/dL (ref 100–199)
HDL: 57 mg/dL (ref >39)
LDL Chol Calc (NIH): 88 mg/dL (ref 0–99)
LDL/HDL Ratio: 1.5 ratio (ref 0.0–3.2)
Triglycerides: 115 mg/dL (ref 0–149)
VLDL Cholesterol Cal: 21 mg/dL (ref 5–40)

## 2022-04-23 LAB — T4, FREE: Free T4: 1.17 ng/dL (ref 0.82–1.77)

## 2022-04-23 LAB — COMPREHENSIVE METABOLIC PANEL
ALT: 25 IU/L (ref 0–32)
AST: 30 IU/L (ref 0–40)
Albumin/Globulin Ratio: 1.4 (ref 1.2–2.2)
Albumin: 4.1 g/dL (ref 3.8–4.9)
Alkaline Phosphatase: 89 IU/L (ref 44–121)
BUN/Creatinine Ratio: 11 (ref 9–23)
BUN: 6 mg/dL (ref 6–24)
Bilirubin Total: 0.3 mg/dL (ref 0.0–1.2)
CO2: 19 mmol/L — ABNORMAL LOW (ref 20–29)
Calcium: 8.9 mg/dL (ref 8.7–10.2)
Chloride: 103 mmol/L (ref 96–106)
Creatinine, Ser: 0.55 mg/dL — ABNORMAL LOW (ref 0.57–1.00)
Globulin, Total: 3 g/dL (ref 1.5–4.5)
Glucose: 67 mg/dL — ABNORMAL LOW (ref 70–99)
Potassium: 4.4 mmol/L (ref 3.5–5.2)
Sodium: 141 mmol/L (ref 134–144)
Total Protein: 7.1 g/dL (ref 6.0–8.5)
eGFR: 108 mL/min/{1.73_m2} (ref >59)

## 2022-04-23 LAB — HEMOGLOBIN A1C
Est. average glucose Bld gHb Est-mCnc: 123 mg/dL
Hgb A1c MFr Bld: 5.9 % — ABNORMAL HIGH (ref 4.8–5.6)

## 2022-04-23 LAB — VITAMIN D 25 HYDROXY (VIT D DEFICIENCY, FRACTURES): Vit D, 25-Hydroxy: 36.3 ng/mL (ref 30.0–100.0)

## 2022-04-23 LAB — INSULIN, RANDOM: INSULIN: 8.7 u[IU]/mL (ref 2.6–24.9)

## 2022-04-23 LAB — VITAMIN B12: Vitamin B-12: 1248 pg/mL — ABNORMAL HIGH (ref 232–1245)

## 2022-04-23 LAB — TSH: TSH: 0.919 u[IU]/mL (ref 0.450–4.500)

## 2022-04-23 LAB — FOLATE: Folate: >20 ng/mL (ref >3.0)

## 2022-04-27 NOTE — Progress Notes (Signed)
Chief Complaint:   [name] (MR# [id number]) is a 56 y.o. female who presents for evaluation and treatment of obesity and related comorbidities. Current BMI is Body mass index is 41.54 kg/m. [name] has been struggling with her weight for many years and has been unsuccessful in either losing weight, maintaining weight loss, or reaching her healthy weight goal.  [name] is currently in the action stage of change and ready to dedicate time achieving and maintaining a healthier weight. [name] is interested in becoming our patient and working on intensive lifestyle modifications including (but not limited to) diet and exercise for weight loss.  [name] was referred by Dr. Debara Pickett. She sees him for history of acute myocardial infarction and heart palpitations. 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 She doesn't eat "meat, cereal, or seafood" but will eat chicken, flounder, and [place].pt does like protein shakes. She skips breakfast and dinner.   [name]'s habits were reviewed today and are as follows: she thinks her family will eat healthier with her, her desired weight loss is 102 lbs, she started gaining weight at age 21, her heaviest weight ever was 242 pounds, she is a picky eater and doesn't like to eat healthier foods, she has significant food cravings issues, she skips meals frequently, she is frequently drinking liquids with calories, she frequently makes poor food choices, and she struggles with emotional eating.  Depression Screen [name]'s Food and Mood (modified PHQ-9) score was 8.     04/22/2022    7:53 AM  Depression screen PHQ 2/9  Decreased Interest 1  Down, Depressed, Hopeless 0  PHQ - 2 Score 1  Altered sleeping 2  Tired, decreased energy 3  Change in appetite 2  Feeling bad or failure about yourself  0  Trouble concentrating 0  Moving slowly or fidgety/restless 0  Suicidal thoughts 0  PHQ-9 Score 8  Difficult doing work/chores Not difficult at all   Subjective:    1. Other fatigue [name] admits to daytime somnolence and admits to waking up still tired. Patient has a history of symptoms of daytime fatigue, morning fatigue, and morning headache. [name] generally gets 4 hours of sleep per night, and states that she has poor sleep quality. Snoring is present. Apneic episodes are present. Epworth Sleepiness Score is 2. Pt had a cardiac cath done by Dr. Debara Pickett in 2015, which was negative for arthrosclerosis.  2. SOB (shortness of breath) on exertion [name] notes increasing shortness of breath with exercising and seems to be worsening over time with weight gain. She notes getting out of breath sooner with activity than she used to. This has gotten worse recently. [name] denies shortness of breath at rest or orthopnea.  3. Pure hypercholesterolemia [name] has hyperlipidemia and has been trying to improve her cholesterol levels with intensive lifestyle modifications including a low saturated fat diet, exercise, and weight loss. She denies any chest pain, claudication, or myalgias. Medication: Vytorin  4. Other osteoporosis without current pathological fracture She is managed by Dr. Kelton Pillar at endocrinology. Medication: Fosamax   5. B12 nutritional deficiency Pt is taking B12 for glaucoma and, per ophthalmology, B12 helps with her retina. She is not sure if she takes B12 or B-complex.   6. Vitamin D deficiency [name] takes OTC Vit D 3000 IU daily.  7. OSA (obstructive sleep apnea) She recently had a sleep study done with Dr. Claiborne Billings and hasn't gotten her CPAP machine yet.  8. At risk for heart disease [name]  is at higher than average risk for cardiovascular disease due to obesity.   Assessment/Plan:   Orders Placed This Encounter  Procedures   VITAMIN D 25 Hydroxy (Vit-D Deficiency, Fractures)   TSH   T4, free   Lipid Panel With LDL/HDL Ratio   Insulin, random   Hemoglobin A1c   Folate   Comprehensive metabolic panel   CBC with  Differential/Platelet   Vitamin B12    Medications Discontinued During This Encounter  Medication Reason   Cholecalciferol 50 MCG (2000 UT) TABS    solifenacin (VESICARE) 5 MG tablet      No orders of the defined types were placed in this encounter.    1. Other fatigue [name] does feel that her weight is causing her energy to be lower than it should be. Fatigue may be related to obesity, depression or many other causes. Labs will be ordered, and in the meanwhile, [name] will focus on self care including making healthy food choices, increasing physical activity and focusing on stress reduction. ECG was done 12/05/2021 with cardiology and reviewed today.  Lab/Orders today: - TSH - T4, free - Folate - CBC with Differential/Platelet - Vitamin B12  2. SOB (shortness of breath) on exertion [name] does feel that she gets out of breath more easily that she used to when she exercises. [name]'s shortness of breath appears to be obesity related and exercise induced. She has agreed to work on weight loss and gradually increase exercise to treat her exercise induced shortness of breath. Will continue to monitor closely.  3. Pure hypercholesterolemia Cardiovascular risk and specific lipid/LDL goals reviewed.  We discussed several lifestyle modifications today and [name] will continue to work on diet, exercise and weight loss efforts. Orders and follow up as documented in patient record.  Continue treatment per Dr. Debara Pickett. Pt advised to speak with cardiologist regarding need for coronary cat score or not. Start prudent nutritional plan!  Counseling Intensive lifestyle modifications are the first line treatment for this issue. Dietary changes: Increase soluble fiber. Decrease simple carbohydrates. Exercise changes: Moderate to vigorous-intensity aerobic activity 150 minutes per week if tolerated. Lipid-lowering medications: see documented in medical record.  Lab/Orders today: - Lipid Panel With  LDL/HDL Ratio - Comprehensive metabolic panel - CBC with Differential/Platelet  4. Other osteoporosis without current pathological fracture Continue treatment per endocrinology. We will continue to monitor. Orders and follow up as documented in patient record.  Counseling Osteoporosis happens when your bones get thin and weak. This can cause your bones to break (fracture) more easily.  Exercise is very important to keep bones strong. Focus on strength training (lifting weights) and exercises that make your muscles work to hold your body weight up (weight-bearing exercises). These include tai chi, yoga, and walking.  Limit alcohol intake to no more than 1 drink a day for nonpregnant women and 2 drinks a day for men. One drink equals 12 oz of beer, 5 oz of wine, or 1 oz of hard liquor. Do not use any products that have nicotine or tobacco in them.  Preventing falls Use tools to help you move around (mobility aids) as needed. These include canes, walkers, scooters, and crutches. Keep rooms well-lit and free of clutter. Wear shoes that fit you well and support your feet. Eat plenty of calcium and Vitamin D as these nutrients are good for your bones.    Lab/Orders today: - VITAMIN D 25 Hydroxy (Vit-D Deficiency, Fractures) - TSH - T4, free - Insulin, random - Hemoglobin A1c -  Folate - Vitamin B12  5. B12 nutritional deficiency Treatment per ophthalmology. Per pt, B12 level was very high when taking the supplement in the past but doctor wants her to take it. The diagnosis was reviewed with the patient. Counseling provided today, see below. We will continue to monitor. Orders and follow up as documented in patient record.  Counseling The body needs vitamin B12: to make red blood cells; to make DNA; and to help the nerves work properly so they can carry messages from the brain to the body.  The main causes of vitamin B12 deficiency include dietary deficiency, digestive diseases, pernicious  anemia, and having a surgery in which part of the stomach or small intestine is removed.  Certain medicines can make it harder for the body to absorb vitamin B12. These medicines include: heartburn medications; some antibiotics; some medications used to treat diabetes, gout, and high cholesterol.  In some cases, there are no symptoms of this condition. If the condition leads to anemia or nerve damage, various symptoms can occur, such as weakness or fatigue, shortness of breath, and numbness or tingling in your hands and feet.   Treatment:  May include taking vitamin B12 supplements.  Avoid alcohol.  Eat lots of healthy foods that contain vitamin B12: Beef, pork, chicken, [place], and organ meats, such as liver.  Seafood: This includes clams, rainbow trout, salmon, tuna, and haddock. Eggs.  Cereal and dairy products that are fortified: This means that vitamin B12 has been added to the food.   Lab/Orders today: - Folate - Vitamin B12  6. Vitamin D deficiency Low Vitamin D level contributes to fatigue and are associated with obesity, breast, and colon cancer. She agrees to continue OTC Vitamin D 3,000 IU daily and will follow-up for routine testing of Vitamin D, at least 2-3 times per year to avoid over-replacement.  Lab/Orders today: - VITAMIN D 25 Hydroxy (Vit-D Deficiency, Fractures)  7. OSA (obstructive sleep apnea) Await cardiology to get equipment in for CPAP. Pt encouraged to call them and inquire about it every couple of weeks. Intensive lifestyle modifications are the first line treatment for this issue. We discussed several lifestyle modifications today and she will continue to work on diet, exercise and weight loss efforts. We will continue to monitor. Orders and follow up as documented in patient record.   8. Depression screening [name] had a positive depression screening. Depression is commonly associated with obesity and often results in emotional eating behaviors. We will monitor  this closely and work on CBT to help improve the non-hunger eating patterns. Referral to Psychology may be required if no improvement is seen as she continues in our clinic.  9. At risk for heart disease Due to Rockne Menghini current state of health and medical condition(s), she is at a higher risk for heart disease.  This puts the patient at much greater risk to subsequently develop cardiopulmonary conditions that can significantly affect patient's quality of life in a negative manner as well.    At least 30 minutes were spent on counseling [name] about these concerns today, and we discussed the importance of reversing risks factors of obesity, especially truncal and visceral fat, hypertension, hyperlipidemia, and pre-diabetes.  The initial goal is to lose at least 5-10% of starting weight to help reduce these risk factors.  Counseling: Intensive lifestyle modifications were discussed with [name] as the most appropriate first line of treatment.  she will continue to work on diet, exercise, and weight  loss efforts.  We will continue to reassess these conditions on a fairly regular basis in an attempt to decrease the patient's overall morbidity and mortality.  Evidence-based interventions for health behavior change were utilized today including the discussion of self monitoring techniques, problem-solving barriers, and SMART goal setting techniques.  Specifically, regarding patient's less desirable eating habits and patterns, we employed the technique of small changes when [name] has not been able to fully commit to her prudent nutritional plan.  10. Class 3 severe obesity with serious comorbidity and body mass index (BMI) of 40.0 to 44.9 in adult, unspecified obesity type (HCC) [name] is currently in the action stage of change and her goal is to continue with weight loss efforts. I recommend Kasandra begin the structured treatment plan as follows:  She has agreed to the Category 1  Plan with lunch options and protein equivalents.  Exercise goals:  As is    Behavioral modification strategies: no skipping meals and planning for success.  She was informed of the importance of frequent follow-up visits to maximize her success with intensive lifestyle modifications for her multiple health conditions. She was informed we would discuss her lab results at her next visit unless there is a critical issue that needs to be addressed sooner. Treana agreed to keep her next visit at the agreed upon time to discuss these results.  Objective:   Blood pressure 124/85, pulse 73, temperature 98 F (36.7 C), height _0  (1.626 m), weight 242 lb (109.8 kg), SpO2 99 %. Body mass index is 41.54 kg/m.  EKG: Normal sinus rhythm, rate 86.  Indirect Calorimeter completed today shows a VO2 of 190 and a REE of 1310.  Her calculated basal metabolic rate is 5974 thus her basal metabolic rate is worse than expected.  General: Cooperative, alert, well developed, in no acute distress. HEENT: Conjunctivae and lids unremarkable. Cardiovascular: Regular rhythm.  Lungs: Normal work of breathing. Neurologic: No focal deficits.   Lab Results  Component Value Date   CREATININE 0.55 (L) 04/22/2022   BUN 6 04/22/2022   NA 141 04/22/2022   K 4.4 04/22/2022   CL 103 04/22/2022   CO2 19 (L) 04/22/2022   Lab Results  Component Value Date   ALT 25 04/22/2022   AST 30 04/22/2022   ALKPHOS 89 04/22/2022   BILITOT 0.3 04/22/2022   Lab Results  Component Value Date   HGBA1C 5.9 (H) 04/22/2022   HGBA1C 6.0 01/23/2022   HGBA1C 5.8 07/26/2021   HGBA1C 5.8 01/25/2021   HGBA1C 5.8 07/01/2019   Lab Results  Component Value Date   INSULIN 8.7 04/22/2022   Lab Results  Component Value Date   TSH 0.919 04/22/2022   Lab Results  Component Value Date   CHOL 166 04/22/2022   HDL 57 04/22/2022   LDLCALC 88 04/22/2022   LDLDIRECT 184.9 08/04/2012   TRIG 115 04/22/2022   CHOLHDL 3 01/23/2022    Lab Results  Component Value Date   WBC 5.8 01/23/2022   HGB 13.2 01/23/2022   HCT 39.6 01/23/2022   MCV 92.1 01/23/2022   PLT 266.0 01/23/2022   Lab Results  Component Value Date   IRON 69 07/01/2019   Attestation Statements:   Reviewed by clinician on day of visit: allergies, medications, problem list, medical history, surgical history, family history, social history, and previous encounter notes.  I, Kathlene November, BS, CMA, am acting as transcriptionist for [employer], DO.  I have reviewed the above documentation for  accuracy and completeness, and I agree with the above. Marjory Sneddon, D.O.  The [place] was signed into law in 2016 which includes the topic of electronic health records.  This provides immediate access to information in MyChart.  This includes consultation notes, operative notes, office notes, lab results and pathology reports.  If you have any questions about what you read please let us know at your next visit so we can discuss your concerns and take corrective action if need be.  We are right here with you.

## 2022-05-06 ENCOUNTER — Encounter (INDEPENDENT_AMBULATORY_CARE_PROVIDER_SITE_OTHER): Payer: Self-pay | Admitting: Family Medicine

## 2022-05-06 ENCOUNTER — Ambulatory Visit (INDEPENDENT_AMBULATORY_CARE_PROVIDER_SITE_OTHER): Payer: PRIVATE HEALTH INSURANCE | Admitting: Family Medicine

## 2022-05-06 ENCOUNTER — Ambulatory Visit (INDEPENDENT_AMBULATORY_CARE_PROVIDER_SITE_OTHER): Payer: PRIVATE HEALTH INSURANCE | Admitting: Podiatry

## 2022-05-06 ENCOUNTER — Encounter: Payer: Self-pay | Admitting: Podiatry

## 2022-05-06 VITALS — BP 111/82 | HR 69 | Temp 98.0°F | Ht 64.0 in | Wt 233.0 lb

## 2022-05-06 DIAGNOSIS — E7800 Pure hypercholesterolemia, unspecified: Secondary | ICD-10-CM

## 2022-05-06 DIAGNOSIS — E538 Deficiency of other specified B group vitamins: Secondary | ICD-10-CM | POA: Diagnosis not present

## 2022-05-06 DIAGNOSIS — R7303 Prediabetes: Secondary | ICD-10-CM | POA: Diagnosis not present

## 2022-05-06 DIAGNOSIS — M79674 Pain in right toe(s): Secondary | ICD-10-CM

## 2022-05-06 DIAGNOSIS — I2510 Atherosclerotic heart disease of native coronary artery without angina pectoris: Secondary | ICD-10-CM

## 2022-05-06 DIAGNOSIS — E559 Vitamin D deficiency, unspecified: Secondary | ICD-10-CM

## 2022-05-06 DIAGNOSIS — Z9189 Other specified personal risk factors, not elsewhere classified: Secondary | ICD-10-CM

## 2022-05-06 DIAGNOSIS — I2584 Coronary atherosclerosis due to calcified coronary lesion: Secondary | ICD-10-CM

## 2022-05-06 DIAGNOSIS — B351 Tinea unguium: Secondary | ICD-10-CM | POA: Diagnosis not present

## 2022-05-06 DIAGNOSIS — E669 Obesity, unspecified: Secondary | ICD-10-CM

## 2022-05-06 DIAGNOSIS — Z6841 Body Mass Index (BMI) 40.0 and over, adult: Secondary | ICD-10-CM

## 2022-05-06 DIAGNOSIS — L600 Ingrowing nail: Secondary | ICD-10-CM | POA: Diagnosis not present

## 2022-05-06 NOTE — Patient Instructions (Signed)

## 2022-05-06 NOTE — Progress Notes (Signed)
Subjective:   Patient ID: [name], female   DOB: 56 y.o.   MRN: [id number]   HPI Chief Complaint  Patient presents with   Nail Problem    Patient states that the bilateral nails are starting to discolor and turn black. Patient states this has been an issue for the last 3 month, she thinks it may be a fungus and is now concerned on what to do next.     The distal-year-old female presents with the above concerns.  She states that she went to the nail shop and got fungus. The right nail has come off some and it causes pain with pressure, closed shoes. She has tried some OTC cream on the nails but that is not helping. No swelling, redness, drainage.    Review of Systems  All other systems reviewed and are negative.  Past Medical History:  Diagnosis Date   ANXIETY 03/27/2010   BACK PAIN, CHRONIC 03/27/2010   Classic migraine with aura 05/30/2015   COMMON MIGRAINE 03/27/2010   [place] DISEASE, CERVICAL 03/27/2010   FATIGUE 03/27/2010   GERD 03/27/2010   Glaucoma    bilateral   HYPERLIPIDEMIA 03/27/2010   Multiple sclerosis ([place]) 10/23/2010   unconfirmed, but suspected   Myocardial infarction [facility])    "old"   Obesity    Osteoporosis 09/24/2012   Palpitations    Unspecified vitamin D deficiency 10/23/2010    Past Surgical History:  Procedure Laterality Date   ABDOMINAL HYSTERECTOMY     LEFT HEART CATHETERIZATION WITH CORONARY ANGIOGRAM N/A 03/31/2014   Procedure: LEFT HEART CATHETERIZATION WITH CORONARY ANGIOGRAM;  Surgeon: Pixie Casino, MD;  Location: [facility] CATH LAB;  Service: Cardiovascular;  Laterality: N/A;   Neg stress test  2007   s/p eye surgury     s/p knee surgury       Current Outpatient Medications:    alendronate (FOSAMAX) 70 MG tablet, Take 1 tablet (70 mg total) by mouth every 7 (seven) days. Take with a full glass of water on an empty stomach., Disp: 13 tablet, Rfl: 3   aspirin 81 MG EC tablet, Take 1 tablet (81 mg total) by mouth daily. Swallow whole.,  Disp: 30 tablet, Rfl: 12   brimonidine (ALPHAGAN P) 0.1 % SOLN, Place 1 drop into both eyes 2 (two) times daily., Disp: , Rfl:    ezetimibe-simvastatin (VYTORIN) 10-40 MG tablet, TAKE 1 TABLET BY MOUTH DAILY. KEEP OV., Disp: 30 tablet, Rfl: 4   timolol (TIMOPTIC) 0.5 % ophthalmic solution, 1 drop 2 (two) times daily., Disp: , Rfl:   Allergies  Allergen Reactions   Topamax Other (See Comments)    dizziness          Objective:  Physical Exam  General: AAO x3, NAD  Dermatological: Hallux toenails are hypertrophic, dystrophic with blood, brown discoloration.  There is no edema, erythema or signs of infection.  Incurvation of the hallux nail borders.  No open lesions.  Vascular: Dorsalis Pedis artery and Posterior Tibial artery pedal pulses are 2/4 bilateral with immedate capillary fill time.  There is no pain with calf compression, swelling, warmth, erythema.   Neruologic: Grossly intact via light touch bilateral.   Musculoskeletal: Occasional tenderness of the right hallux toenail.  No other areas of discomfort.  Gait: Unassisted, Nonantalgic.       Assessment:   Right hallux onychodystrophy, discomfort     Plan:  -Treatment options discussed including all alternatives, risks, and complications.m -Etiology of symptoms were discussed -At this time,  she wants to proceed with total nail removal without chemical matricectomy.  We discussed that removal of the nail and it growing back it is not a guarantee that the nail will regrow normally.  Risks and complications were discussed with the patient for which they understand and  verbally consent to the procedure. Under sterile conditions a total of 3 mL of a mixture of 2% lidocaine plain and 0.5% Marcaine plain was infiltrated in a hallux block fashion. Once anesthetized, the skin was prepped in sterile fashion. A tourniquet was then applied. Next the right hallux toenail borders were removed in total making sure remove all nail  borders.  Once the nail was removed, the area was debrided and the underlying skin was intact. The area was irrigated and hemostasis was obtained.  A dry sterile dressing was applied. After application of the dressing the tourniquet was removed and there is found to be an immediate capillary refill time to the digit. The patient tolerated the procedure well any complications. Post procedure instructions were discussed the patient for which he verbally understood. Discussed signs/symptoms of worsening infection and directed to call the office immediately should any occur or go directly to the emergency room. In the meantime, encouraged to call the office with any questions, concerns, changes symptoms. -Nail sent to [facility].   Trula Slade DPM

## 2022-05-14 ENCOUNTER — Ambulatory Visit (INDEPENDENT_AMBULATORY_CARE_PROVIDER_SITE_OTHER): Payer: PRIVATE HEALTH INSURANCE | Admitting: Ophthalmology

## 2022-05-14 ENCOUNTER — Encounter (INDEPENDENT_AMBULATORY_CARE_PROVIDER_SITE_OTHER): Payer: Self-pay | Admitting: Ophthalmology

## 2022-05-14 DIAGNOSIS — I2510 Atherosclerotic heart disease of native coronary artery without angina pectoris: Secondary | ICD-10-CM

## 2022-05-14 DIAGNOSIS — I2584 Coronary atherosclerosis due to calcified coronary lesion: Secondary | ICD-10-CM

## 2022-05-14 DIAGNOSIS — H3322 Serous retinal detachment, left eye: Secondary | ICD-10-CM

## 2022-05-14 DIAGNOSIS — H4053X2 Glaucoma secondary to other eye disorders, bilateral, moderate stage: Secondary | ICD-10-CM

## 2022-05-14 DIAGNOSIS — H4422 Degenerative myopia, left eye: Secondary | ICD-10-CM | POA: Diagnosis not present

## 2022-05-14 DIAGNOSIS — H4421 Degenerative myopia, right eye: Secondary | ICD-10-CM | POA: Diagnosis not present

## 2022-05-14 NOTE — Assessment & Plan Note (Signed)
Stable for the last 5 years.  Patient continues on oral vitamin B complex supplement as this may delay oral RNFL atrophy with silicone oil in place long-term.  Option for silicone oil removal in the left eye is possible 1 day.

## 2022-05-14 NOTE — Progress Notes (Signed)
05/14/2022     CHIEF COMPLAINT Patient presents for  Chief Complaint  Patient presents with   Retina Follow Up      HISTORY OF PRESENT ILLNESS: [name] is a 56 y.o. female who presents to the clinic today for:   HPI     Retina Follow Up           Diagnosis: Other   Laterality: right eye   Severity: moderate   Course: stable         Comments   5 MOS FOR DILATE OU, COLOR FP, OCT. Pt stated vision has remained stable since last visit. Pt is currently taking Brimonidine and Timolol- 1 drop into both eyes daily,        Last edited by Silvestre Moment on 05/14/2022  8:01 AM.      Referring physician: Biagio Borg, MD [place],  [place] [id number]  HISTORICAL INFORMATION:   Selected notes from the MEDICAL RECORD NUMBER    Lab Results  Component Value Date   HGBA1C 5.9 (H) 04/22/2022     CURRENT MEDICATIONS: Current Outpatient Medications (Ophthalmic Drugs)  Medication Sig   brimonidine (ALPHAGAN P) 0.1 % SOLN Place 1 drop into both eyes 2 (two) times daily.   timolol (TIMOPTIC) 0.5 % ophthalmic solution 1 drop 2 (two) times daily.   No current facility-administered medications for this visit. (Ophthalmic Drugs)   Current Outpatient Medications (Other)  Medication Sig   alendronate (FOSAMAX) 70 MG tablet Take 1 tablet (70 mg total) by mouth every 7 (seven) days. Take with a full glass of water on an empty stomach.   aspirin 81 MG EC tablet Take 1 tablet (81 mg total) by mouth daily. Swallow whole.   ezetimibe-simvastatin (VYTORIN) 10-40 MG tablet TAKE 1 TABLET BY MOUTH DAILY. KEEP OV.   No current facility-administered medications for this visit. (Other)      REVIEW OF SYSTEMS: ROS   Negative for: Constitutional, Gastrointestinal, Neurological, Skin, Genitourinary, Musculoskeletal, HENT, Endocrine, Cardiovascular, Eyes, Respiratory, Psychiatric, Allergic/Imm, Heme/Lymph Last edited by Silvestre Moment on 05/14/2022  8:00 AM.        ALLERGIES Allergies  Allergen Reactions   Topamax Other (See Comments)    dizziness    PAST MEDICAL HISTORY Past Medical History:  Diagnosis Date   ANXIETY 03/27/2010   BACK PAIN, CHRONIC 03/27/2010   Classic migraine with aura 05/30/2015   COMMON MIGRAINE 03/27/2010   [place] DISEASE, CERVICAL 03/27/2010   FATIGUE 03/27/2010   GERD 03/27/2010   Glaucoma    bilateral   HYPERLIPIDEMIA 03/27/2010   Multiple sclerosis ([place]) 10/23/2010   unconfirmed, but suspected   Myocardial infarction [facility])    "old"   Obesity    Osteoporosis 09/24/2012   Palpitations    Unspecified vitamin D deficiency 10/23/2010   Past Surgical History:  Procedure Laterality Date   ABDOMINAL HYSTERECTOMY     LEFT HEART CATHETERIZATION WITH CORONARY ANGIOGRAM N/A 03/31/2014   Procedure: LEFT HEART CATHETERIZATION WITH CORONARY ANGIOGRAM;  Surgeon: Pixie Casino, MD;  Location: [facility] CATH LAB;  Service: Cardiovascular;  Laterality: N/A;   Neg stress test  2007   s/p eye surgury     s/p knee surgury      FAMILY HISTORY Family History  Problem Relation Age of Onset   Cancer Mother        breast   Hyperlipidemia Mother    Colon polyps Mother    Heart disease Father    Diabetes  Father    Hyperlipidemia Father    Hypertension Father    Colon polyps Father    Kidney disease Father    Thyroid disease Father    Lupus Maternal Aunt    Lupus Cousin        mother and father's side   Diabetes Other    Arthritis Other    Dementia Other    Cancer Other        2 uncles with lung cancer   Heart disease Other        2 uncles with heart disease   Colon cancer Neg Hx    Esophageal cancer Neg Hx    Stomach cancer Neg Hx    Rectal cancer Neg Hx     SOCIAL HISTORY Social History   Tobacco Use   Smoking status: Never   Smokeless tobacco: Never  Vaping Use   Vaping Use: Never used  Substance Use Topics   Alcohol use: No    Alcohol/week: 0.0 standard drinks of alcohol   Drug use: No          OPHTHALMIC EXAM:  Base Eye Exam     Visual Acuity (ETDRS)       Right Left   Dist [place] 20/200    Dist cc  20/60   Dist ph  20/150 -1    Dist ph cc  20/50 -2    Correction: Glasses         Tonometry (Tonopen, 8:05 AM)       Right Left   Pressure 15 12         Pupils       Pupils APD   Right PERRL None   Left PERRL None         Visual Fields       Left Right   Restrictions Partial outer superior temporal, inferior temporal, superior nasal deficiencies Total inferior nasal deficiency         Extraocular Movement       Right Left    Full, Ortho Full, Ortho         Neuro/Psych     Oriented x3: Yes   Mood/Affect: Normal         Dilation     Both eyes: 1.0% Mydriacyl, 2.5% Phenylephrine @ 8:05 AM           Slit Lamp and Fundus Exam     External Exam       Right Left   External Normal Normal         Slit Lamp Exam       Right Left   Lids/Lashes Normal Normal   Conjunctiva/Sclera White and quiet White and quiet   Cornea Clear Clear   Anterior Chamber Deep and quiet AC deep, no emulsification of oil   Iris Round and reactive Round and reactive   Lens Aphakia Centered posterior chamber intraocular lens   Anterior Vitreous Clear Silicone oil clear, no emulsification         Fundus Exam       Right Left   Posterior Vitreous Clear vitrectomized Silicone oil clear   Disc Tilted cup, Tilted disc, posterior staphyloma to the sclera Tilted cup, Tilted disc posterior staphyloma to the sclera   C/D Ratio 0.55 0.3   Macula Myopic macular degeneration Myopic macular degeneration   Vessels Normal Normal   Periphery Good chorioretinal scarring 360, no retinal tears or detachment, incidental note of white lines of nonperfusion of the retina at  the equator and anteriorly Centered between 2 and 5:00 meridians nasally Old, traction retinal detachment at an anterior to the equator inferonasal and nasal, stable over years             IMAGING AND PROCEDURES  Imaging and Procedures for 05/14/22  OCT, Retina - OU - Both Eyes       Right Eye Quality was borderline. Progression has been stable. Findings include abnormal foveal contour.   Left Eye Quality was borderline. Progression has been stable. Findings include abnormal foveal contour.   Notes Myopic macula degeneration, Ou, stable, no active cnvm      Color Fundus Photography Optos - OU - Both Eyes       Right Eye Progression has been stable. Disc findings include normal observations, increased cup to disc ratio. Macula : geographic atrophy. Vessels : normal observations. Periphery : normal observations.   Left Eye Progression has been stable. Macula : geographic atrophy.   Notes OS with clear silicone, retina attached.  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  OD, acuity limited by geographic atrophy of myopic macular degeneration.  Slight posterior staphyloma no active disease.  Peripheral chorioretinal scarring and laser retinopexy, retina attached.  Continue to monitor nasally, anterior retinal nonperfused between 2 and 5:00 meridians, clear media however              ASSESSMENT/PLAN:  Right degenerative progressive high myopia Stable over time  Left degenerative progressive high myopia Stable over time  Partial retinal detachment, left Stable for the last 5 years.  Patient continues on oral vitamin B complex supplement as this may delay oral RNFL atrophy with silicone oil in place long-term.  Option for silicone oil removal in the left eye is possible 1 day.  Secondary glaucoma due to combination mechanisms, bilateral, moderate stage Stable topical therapy continue medications     ICD-10-CM   1. Right degenerative progressive high myopia  H44.21 OCT, Retina - OU - Both Eyes    Color Fundus Photography Optos -  OU - Both Eyes    2. Left degenerative progressive high myopia  H44.22     3. Partial retinal detachment, left  H33.22     4. Secondary glaucoma due to combination mechanisms, bilateral, moderate stage  H40.53X2       1.  Chronic retinal detachment anteriorly left eye.  Atrophic retinal changes are noted on clinical examination.  Silicone oil in place patient is tolerating very well.  With good acuity and functioning ambulatory fashion.  May 1 day still consider vitrectomy silicone oil removal.  However there is at risk of retinal reattachment although there is good chorioretinal scarring walling off the nasal atrophic retinal changes.  Continue on oral vitamin B complex  2.  High myopia peripapillary atrophy or risk factors for OAG.  Patient continue on topical medications to lower intraocular pressure.  3.  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  Ophthalmic Meds Ordered this visit:  No orders of the defined types were placed in this encounter.      Return in about 6 months (around 11/12/2022) for DILATE OU, COLOR FP, OCT.  There are no Patient Instructions on file for this visit.   Explained the diagnoses, plan, and follow up with the patient and they expressed understanding.  Patient expressed understanding of the importance of proper follow up care.  Clent Demark Dontrail Blackwell M.D. Diseases & Surgery of the Retina and Vitreous [facility]ina [facility] Diabetic [place] 05/14/22     Abbreviations: M myopia (nearsighted); A astigmatism; H hyperopia (farsighted); P presbyopia; Mrx spectacle prescription;  CTL contact lenses; OD right eye; OS left eye; OU both eyes  XT exotropia; ET esotropia; PEK punctate epithelial keratitis; PEE punctate epithelial erosions; DES dry eye syndrome; MGD meibomian gland dysfunction; ATs artificial tears; PFAT's preservative free artificial tears; [place] nuclear sclerotic cataract; PSC posterior subcapsular cataract; ERM  epi-retinal membrane; PVD posterior vitreous detachment; RD retinal detachment; DM diabetes mellitus; DR diabetic retinopathy; NPDR non-proliferative diabetic retinopathy; PDR proliferative diabetic retinopathy; CSME clinically significant macular edema; DME diabetic macular edema; dbh dot blot hemorrhages; CWS cotton wool spot; POAG primary open angle glaucoma; C/D cup-to-disc ratio; HVF humphrey visual field; GVF goldmann visual field; OCT optical coherence tomography; IOP intraocular pressure; BRVO Branch retinal vein occlusion; CRVO central retinal vein occlusion; CRAO central retinal artery occlusion; BRAO branch retinal artery occlusion; RT retinal tear; SB scleral buckle; PPV pars plana vitrectomy; VH Vitreous hemorrhage; PRP panretinal laser photocoagulation; IVK intravitreal kenalog; VMT vitreomacular traction; MH Macular hole;  NVD neovascularization of the disc; NVE neovascularization elsewhere; AREDS age related eye disease study; ARMD age related macular degeneration; POAG primary open angle glaucoma; EBMD epithelial/anterior basement membrane dystrophy; ACIOL anterior chamber intraocular lens; IOL intraocular lens; PCIOL posterior chamber intraocular lens; Phaco/IOL phacoemulsification with intraocular lens placement; [place] photorefractive keratectomy; LASIK laser assisted in situ keratomileusis; HTN hypertension; DM diabetes mellitus; COPD chronic obstructive pulmonary disease

## 2022-05-14 NOTE — Assessment & Plan Note (Signed)
Stable over time. 

## 2022-05-14 NOTE — Assessment & Plan Note (Signed)
Stable topical therapy continue medications

## 2022-05-15 NOTE — Progress Notes (Unsigned)
Chief Complaint:   OBESITY [name] is here to discuss her progress with her obesity treatment plan along with follow-up of her obesity related diagnoses. [name] is on the Category 1 Plan with lunch options and protein equivalents and states she is following her eating plan approximately 100% of the time. [name] states she is not currently exercising.  Today's visit was #: 2 Starting weight: 242 lbs Starting date: 04/22/2022 Today's weight: 233 lbs Today's date: 05/06/2022 Total lbs lost to date: 9 Total lbs lost since last in-office visit: 9  Interim History: [name] is here today for her first follow-up office visit since starting the program with [place].  All blood work/ lab tests that were recently ordered by myself or an outside provider were reviewed with patient today per their request.   Extended time was spent counseling her on all new disease processes that were discovered or preexisting ones that are affected by BMI.  she understands that many of these abnormalities will need to monitored regularly along with the current treatment plan of prudent dietary changes, in which we are making each and every office visit, to improve these health parameters. [name] notes she was only hungry when she ate late/skipped meals, but otherwise no hunger or physical cravings. Her snack of choice is Yasso bars.  Subjective:   1. Hypercholesterolemia Discussed labs with patient today. CMP is within normal limits. Her FLP is also at goal, however, I asked pt to talk to Dr. Debara Pickett regarding her goal LDL.  2. Pre-diabetes New. Discussed labs with patient today. Fasting insulin is 8.7 and A1c is 5.9. Pt states she has never been told she had a history of pre-diabetes. She denies cravings for carbs currently.  3. Vitamin D deficiency Discussed labs with patient today. Pt with history of osteoporosis, treated by Dr. Kelton Pillar of endocrinology. [name] is on Fosamax and OTC Vit D 2,000-3,000 IU  daily (she thinks).  4. Vitamin B12 deficiency Discussed labs with patient today. Pt was told to take B12 per her ophthalmologist due to history of glaucoma and detatched retina.  5. At risk of diabetes mellitus Najai is at risk for diabetes mellitus due to new onset pre-diabetes and insulin resistance.  Assessment/Plan:  No orders of the defined types were placed in this encounter.   There are no discontinued medications.   No orders of the defined types were placed in this encounter.    1. Hypercholesterolemia Continue treatment per Dr. Debara Pickett at cardiology. Continue Vytorin. Long discussion with pt on how the foods she eats affects her cholesterol panel.  2. Pre-diabetes [name] will continue to work on weight loss, exercise, and decreasing simple carbohydrates to help decrease the risk of diabetes. Handouts provided on pre-diabetes and fasting insulin, after  long discussion with pt. Also long discussion on how the foods she eats will affect her blood sugar and insulin, therefore hunger and cravings.  3. Vitamin D deficiency Not quite at goal at 36.3. - I discussed the importance of vitamin D to the patient's health and well-being.  - I reviewed possible symptoms of low Vitamin D:  low energy, depressed mood, muscle aches, joint aches, osteoporosis etc. was reviewed with patient - low Vitamin D levels may be linked to an increased risk of cardiovascular events and even increased risk of cancers- such as colon and breast.  - ideal vitamin D levels reviewed with patient  - I recommend pt increase to taking a OTC Vit D 4,000 IU daily.   -  Informed patient this may be a lifelong thing, and she was encouraged to continue to take the medicine until told otherwise.    - weight loss will likely improve availability of vitamin D, thus encouraged [name] to continue with meal plan and their weight loss efforts to further improve this condition.  Thus, we will need to monitor levels regularly  (every 3-4 mo on average) to keep levels within normal limits and prevent over supplementation. - pt's questions and concerns regarding this condition addressed.  4. Vitamin B12 deficiency At goal and even a little above. Continue supplementation per ophthalmology/specialist. Continue prudent nutritional plan as well. The diagnosis was reviewed with the patient. Counseling provided today, see below. We will continue to monitor. Orders and follow up as documented in patient record.  Counseling The body needs vitamin B12: to make red blood cells; to make DNA; and to help the nerves work properly so they can carry messages from the brain to the body.  The main causes of vitamin B12 deficiency include dietary deficiency, digestive diseases, pernicious anemia, and having a surgery in which part of the stomach or small intestine is removed.  Certain medicines can make it harder for the body to absorb vitamin B12. These medicines include: heartburn medications; some antibiotics; some medications used to treat diabetes, gout, and high cholesterol.  In some cases, there are no symptoms of this condition. If the condition leads to anemia or nerve damage, various symptoms can occur, such as weakness or fatigue, shortness of breath, and numbness or tingling in your hands and feet.   Treatment:  May include taking vitamin B12 supplements.  Avoid alcohol.  Eat lots of healthy foods that contain vitamin B12: Beef, pork, chicken, [place], and organ meats, such as liver.  Seafood: This includes clams, rainbow trout, salmon, tuna, and haddock. Eggs.  Cereal and dairy products that are fortified: This means that vitamin B12 has been added to the food.   5. At risk of diabetes mellitus - [name] was given diabetes prevention education and counseling today of more than 24 minutes.  - Counseled patient on pathophysiology of disease and meaning/ implication of lab results.  - Reviewed how certain foods can either  stimulate or inhibit insulin release, and subsequently affect hunger pathways  - Importance of following a healthy meal plan with limiting amounts of simple carbohydrates discussed with patient - Effects of regular aerobic exercise on blood sugar regulation reviewed and encouraged an eventual goal of 30 min 5d/week or more as a minimum.  - Briefly discussed treatment options, which always include dietary and lifestyle modification as first line.   - Handouts provided at patient's desire and/or told to go online to the American Diabetes Association website for further information.  6. Obesity, current BMI 40 [name] is currently in the action stage of change. As such, her goal is to continue with weight loss efforts. She has agreed to the Category 1 Plan lunch options and protein equivalents.   Exercise goals:  As is  Behavioral modification strategies: increasing lean protein intake, decreasing simple carbohydrates, and increasing water intake.  [name] has agreed to follow-up with our clinic in 2 weeks. She was informed of the importance of frequent follow-up visits to maximize her success with intensive lifestyle modifications for her multiple health conditions.   Objective:   Blood pressure 111/82, pulse 69, temperature 98 F (36.7 C), height '5\' 4"'$  (1.626 m), weight 233 lb (105.7 kg), SpO2 98 %. Body mass index is 39.99 kg/m.  General: Cooperative, alert, well developed, in no acute distress. HEENT: Conjunctivae and lids unremarkable. Cardiovascular: Regular rhythm.  Lungs: Normal work of breathing. Neurologic: No focal deficits.   Lab Results  Component Value Date   CREATININE 0.55 (L) 04/22/2022   BUN 6 04/22/2022   NA 141 04/22/2022   K 4.4 04/22/2022   CL 103 04/22/2022   CO2 19 (L) 04/22/2022   Lab Results  Component Value Date   ALT 25 04/22/2022   AST 30 04/22/2022   ALKPHOS 89 04/22/2022   BILITOT 0.3 04/22/2022   Lab Results  Component Value Date   HGBA1C 5.9  (H) 04/22/2022   HGBA1C 6.0 01/23/2022   HGBA1C 5.8 07/26/2021   HGBA1C 5.8 01/25/2021   HGBA1C 5.8 07/01/2019   Lab Results  Component Value Date   INSULIN 8.7 04/22/2022   Lab Results  Component Value Date   TSH 0.919 04/22/2022   Lab Results  Component Value Date   CHOL 166 04/22/2022   HDL 57 04/22/2022   LDLCALC 88 04/22/2022   LDLDIRECT 184.9 08/04/2012   TRIG 115 04/22/2022   CHOLHDL 3 01/23/2022   Lab Results  Component Value Date   VD25OH 36.3 04/22/2022   VD25OH 44.00 01/23/2022   VD25OH 36.82 10/25/2021   Lab Results  Component Value Date   WBC 5.8 01/23/2022   HGB 13.2 01/23/2022   HCT 39.6 01/23/2022   MCV 92.1 01/23/2022   PLT 266.0 01/23/2022   Lab Results  Component Value Date   IRON 69 07/01/2019   Attestation Statements:   Reviewed by clinician on day of visit: allergies, medications, problem list, medical history, surgical history, family history, social history, and previous encounter notes.  I, Kathlene November, BS, CMA, am acting as transcriptionist for [employer], DO.   I have reviewed the above documentation for accuracy and completeness, and I agree with the above. Marjory Sneddon, D.O.  The [place] was signed into law in 2016 which includes the topic of electronic health records.  This provides immediate access to information in MyChart.  This includes consultation notes, operative notes, office notes, lab results and pathology reports.  If you have any questions about what you read please let us know at your next visit so we can discuss your concerns and take corrective action if need be.  We are right here with you.

## 2022-05-20 ENCOUNTER — Ambulatory Visit (INDEPENDENT_AMBULATORY_CARE_PROVIDER_SITE_OTHER): Payer: PRIVATE HEALTH INSURANCE | Admitting: Family Medicine

## 2022-05-20 ENCOUNTER — Encounter (INDEPENDENT_AMBULATORY_CARE_PROVIDER_SITE_OTHER): Payer: Self-pay | Admitting: Family Medicine

## 2022-05-20 VITALS — BP 116/80 | HR 64 | Temp 98.1°F | Ht 64.0 in | Wt 229.0 lb

## 2022-05-20 DIAGNOSIS — E559 Vitamin D deficiency, unspecified: Secondary | ICD-10-CM | POA: Diagnosis not present

## 2022-05-20 DIAGNOSIS — E669 Obesity, unspecified: Secondary | ICD-10-CM

## 2022-05-20 DIAGNOSIS — Z6839 Body mass index (BMI) 39.0-39.9, adult: Secondary | ICD-10-CM | POA: Diagnosis not present

## 2022-05-20 MED ORDER — VITAMIN D (ERGOCALCIFEROL) 1.25 MG (50000 UNIT) PO CAPS: 50000.0000 [IU] | ORAL_CAPSULE | ORAL | 0 refills | Status: DC

## 2022-05-22 ENCOUNTER — Encounter: Payer: Self-pay | Admitting: Podiatry

## 2022-05-23 ENCOUNTER — Other Ambulatory Visit: Payer: Self-pay | Admitting: Podiatry

## 2022-05-23 MED ORDER — EFINACONAZOLE 10 % EX SOLN: 1.0000 [drp] | Freq: Every day | CUTANEOUS | 11 refills | Status: AC

## 2022-05-24 ENCOUNTER — Other Ambulatory Visit: Payer: Self-pay | Admitting: Internal Medicine

## 2022-05-25 NOTE — Progress Notes (Signed)
Chief Complaint:   OBESITY [name] is here to discuss her progress with her obesity treatment plan along with follow-up of her obesity related diagnoses. [name] is on the Category 1 Plan with lunch options and protein equivalents and states she is following her eating plan approximately 85% of the time. [name] states she is not currently exercising.  Today's visit was #: 3 Starting weight: 242 lbs Starting date: 04/22/2022 Today's weight: 229 lbs Today's date: 05/20/2022 Total lbs lost to date: 13 Total lbs lost since last in-office visit: 4  Interim History: [place] wasn't able to eat all the proteins she would have liked to. We changed her meal plan and gave her lunch options, and she says it's much better and she likes the variety. Pt is getting in approximately 64 oz of water now, which is much improved.  Subjective:   1. Vitamin D deficiency Pt did increase her OTC dose but it can get expensive. She is interested in once weekly prescription. [name] is tolerating medication(s) well without side effects.  Medication compliance is good as patient endorses taking it as prescribed.  The patient denies additional concerns regarding this condition.        Assessment/Plan:   Meds ordered this encounter  Medications   Vitamin D, Ergocalciferol, (DRISDOL) 1.25 MG (50000 UNIT) CAPS capsule    Sig: Take 1 capsule (50,000 Units total) by mouth every 7 (seven) days.    Dispense:  4 capsule    Refill:  0     1. Vitamin D deficiency - I again reiterated the importance of vitamin D (as well as calcium) to their health and wellbeing.  - I reviewed possible symptoms of low Vitamin D:  low energy, depressed mood, muscle aches, joint aches, osteoporosis etc. - low Vitamin D levels may be linked to an increased risk of cardiovascular events and even increased risk of cancers- such as colon and breast.  - ideal vitamin D levels reviewed with patient  - I recommend pt take a 50,000 IU  weekly prescription vit D - see script below   - Informed patient this may be a lifelong thing, and she was encouraged to continue to take the medicine until told otherwise.    - weight loss will likely improve availability of vitamin D, thus encouraged [name] to continue with meal plan and their weight loss efforts to further improve this condition.  Thus, we will need to monitor levels regularly (every 3-4 mo on average) to keep levels within normal limits and prevent over supplementation. - pt's questions and concerns regarding this condition addressed.  Start- Vitamin D, Ergocalciferol, (DRISDOL) 1.25 MG (50000 UNIT) CAPS capsule; Take 1 capsule (50,000 Units total) by mouth every 7 (seven) days.  Dispense: 4 capsule; Refill: 0  2. Obesity, current BMI 39.4 [name] is currently in the action stage of change. As such, her goal is to continue with weight loss efforts. She has agreed to the Category 1 Plan with lunch options and protein equivalents.   Continue to drink water and increase as tolerated.  Get all protein in.  Exercise goals:  Walk 30 minutes every other day.  Behavioral modification strategies: increasing lean protein intake, avoiding temptations, and planning for success.  [name] has agreed to follow-up with our clinic in 2-3 weeks. She was informed of the importance of frequent follow-up visits to maximize her success with intensive lifestyle modifications for her multiple health conditions.   Objective:   Blood pressure 116/80,  pulse 64, temperature 98.1 F (36.7 C), height '5\' 4"'$  (1.626 m), weight 229 lb (103.9 kg), SpO2 98 %. Body mass index is 39.31 kg/m.  General: Cooperative, alert, well developed, in no acute distress. HEENT: Conjunctivae and lids unremarkable. Cardiovascular: Regular rhythm.  Lungs: Normal work of breathing. Neurologic: No focal deficits.   Lab Results  Component Value Date   CREATININE 0.55 (L) 04/22/2022   BUN 6 04/22/2022   NA 141  04/22/2022   K 4.4 04/22/2022   CL 103 04/22/2022   CO2 19 (L) 04/22/2022   Lab Results  Component Value Date   ALT 25 04/22/2022   AST 30 04/22/2022   ALKPHOS 89 04/22/2022   BILITOT 0.3 04/22/2022   Lab Results  Component Value Date   HGBA1C 5.9 (H) 04/22/2022   HGBA1C 6.0 01/23/2022   HGBA1C 5.8 07/26/2021   HGBA1C 5.8 01/25/2021   HGBA1C 5.8 07/01/2019   Lab Results  Component Value Date   INSULIN 8.7 04/22/2022   Lab Results  Component Value Date   TSH 0.919 04/22/2022   Lab Results  Component Value Date   CHOL 166 04/22/2022   HDL 57 04/22/2022   LDLCALC 88 04/22/2022   LDLDIRECT 184.9 08/04/2012   TRIG 115 04/22/2022   CHOLHDL 3 01/23/2022   Lab Results  Component Value Date   VD25OH 36.3 04/22/2022   VD25OH 44.00 01/23/2022   VD25OH 36.82 10/25/2021   Lab Results  Component Value Date   WBC 5.8 01/23/2022   HGB 13.2 01/23/2022   HCT 39.6 01/23/2022   MCV 92.1 01/23/2022   PLT 266.0 01/23/2022   Lab Results  Component Value Date   IRON 69 07/01/2019    Attestation Statements:   Reviewed by clinician on day of visit: allergies, medications, problem list, medical history, surgical history, family history, social history, and previous encounter notes.  I, Kathlene November, BS, CMA, am acting as transcriptionist for [employer], DO.  I have reviewed the above documentation for accuracy and completeness, and I agree with the above. Marjory Sneddon, D.O.  [place] was signed into law in 2016 which includes the topic of electronic health records.  This provides immediate access to information in MyChart.  This includes consultation notes, operative notes, office notes, lab results and pathology reports.  If you have any questions about what you read please let us know at your next visit so we can discuss your concerns and take corrective action if need be.  We are right here with you.

## 2022-05-26 ENCOUNTER — Ambulatory Visit (INDEPENDENT_AMBULATORY_CARE_PROVIDER_SITE_OTHER): Payer: PRIVATE HEALTH INSURANCE | Admitting: Podiatry

## 2022-05-26 DIAGNOSIS — L600 Ingrowing nail: Secondary | ICD-10-CM | POA: Diagnosis not present

## 2022-05-26 DIAGNOSIS — I2584 Coronary atherosclerosis due to calcified coronary lesion: Secondary | ICD-10-CM

## 2022-05-26 DIAGNOSIS — B351 Tinea unguium: Secondary | ICD-10-CM

## 2022-05-26 DIAGNOSIS — I2510 Atherosclerotic heart disease of native coronary artery without angina pectoris: Secondary | ICD-10-CM

## 2022-05-26 NOTE — Progress Notes (Signed)
Subjective: Chief Complaint  Patient presents with   Ingrown Toenail    Right hallux nail check, Nail is doing well     56 year old female with the above concerns.  She states that she stopped soaking for 10 days.  She states it was sore for the first 7 days but this is much better now.  No drainage or pus or any swelling or redness.  She was contacted by the topical nail fungus treatment.  I received this.  Objective: AAO x3, NAD DP/PT pulses palpable bilaterally, CRT less than 3 seconds Status post right hallux nail avulsion.  Scab is present.  There is no pain.  There is no edema, erythema or signs of infection. No pain with calf compression, swelling, warmth, erythema  Assessment: Onychomycosis  Plan: -All treatment options discussed with the patient including all alternatives, risks, complications.  -Procedure site appears to be healed.  I would continue keeping a Band-Aid on it during the day for action.  Discussed washing with soap and water and over time the scabs come off on its own.  She can go and start the topical antifungal to the other nails as this nail starts to grow and then started but I would wait till the nail starts to grow in. -Patient encouraged to call the office with any questions, concerns, change in symptoms.   Trula Slade DPM

## 2022-06-03 ENCOUNTER — Ambulatory Visit (INDEPENDENT_AMBULATORY_CARE_PROVIDER_SITE_OTHER): Payer: PRIVATE HEALTH INSURANCE | Admitting: Family Medicine

## 2022-06-03 ENCOUNTER — Encounter (INDEPENDENT_AMBULATORY_CARE_PROVIDER_SITE_OTHER): Payer: Self-pay | Admitting: Family Medicine

## 2022-06-03 VITALS — BP 113/79 | HR 78 | Temp 97.8°F | Ht 64.0 in | Wt 223.8 lb

## 2022-06-03 DIAGNOSIS — R7303 Prediabetes: Secondary | ICD-10-CM | POA: Diagnosis not present

## 2022-06-03 DIAGNOSIS — E538 Deficiency of other specified B group vitamins: Secondary | ICD-10-CM | POA: Diagnosis not present

## 2022-06-03 DIAGNOSIS — Z6841 Body Mass Index (BMI) 40.0 and over, adult: Secondary | ICD-10-CM | POA: Insufficient documentation

## 2022-06-03 DIAGNOSIS — E669 Obesity, unspecified: Secondary | ICD-10-CM

## 2022-06-03 DIAGNOSIS — E559 Vitamin D deficiency, unspecified: Secondary | ICD-10-CM | POA: Diagnosis not present

## 2022-06-03 DIAGNOSIS — Z6838 Body mass index (BMI) 38.0-38.9, adult: Secondary | ICD-10-CM

## 2022-06-05 ENCOUNTER — Ambulatory Visit: Payer: PRIVATE HEALTH INSURANCE | Admitting: Internal Medicine

## 2022-06-05 NOTE — Progress Notes (Signed)
Cardiology Clinic Note   Patient Name: [name] Date of Encounter: 06/06/2022  Primary Care Provider:  Biagio Borg, MD Primary Cardiologist:  [name]   Patient Profile    56 year old female with chronic chest pain, cardiac catheterization in 2015 revealed no coronary artery disease, follow up coronary CT scan revealed some coronary calcifications.  Also has a history of morbid obesity, dyspnea on exertion.  Other history includes chronic back pain, GERD, hyperlipidemia, multiple sclerosis.  Last seen by [name] on 12/05/2021.  A home sleep study test was ordered.  Sleep study revealed obstructive sleep apnea and she has been started on CPAP.   Past Medical History    Past Medical History:  Diagnosis Date   ANXIETY 03/27/2010   BACK PAIN, CHRONIC 03/27/2010   Classic migraine with aura 05/30/2015   COMMON MIGRAINE 03/27/2010   [place] DISEASE, CERVICAL 03/27/2010   FATIGUE 03/27/2010   GERD 03/27/2010   Glaucoma    bilateral   HYPERLIPIDEMIA 03/27/2010   Multiple sclerosis ([place]) 10/23/2010   unconfirmed, but suspected   Myocardial infarction [facility])    "old"   Obesity    Osteoporosis 09/24/2012   Palpitations    Unspecified vitamin D deficiency 10/23/2010   Past Surgical History:  Procedure Laterality Date   ABDOMINAL HYSTERECTOMY     LEFT HEART CATHETERIZATION WITH CORONARY ANGIOGRAM N/A 03/31/2014   Procedure: LEFT HEART CATHETERIZATION WITH CORONARY ANGIOGRAM;  Surgeon: Pixie Casino, MD;  Location: [facility] CATH LAB;  Service: Cardiovascular;  Laterality: N/A;   Neg stress test  2007   s/p eye surgury     s/p knee surgury      Allergies  Allergies  Allergen Reactions   Topamax Other (See Comments)    dizziness    History of Present Illness    [name] is a 56 year old female we are following for ongoing assessment and management of hypertension, chronic dyspnea on exertion, and hyperlipidemia.  She comes today without any complaints.  She is medically  compliant.  She normally has a low blood pressure and she is asymptomatic with this.  She has had recent lipids and LFTs completed all were within normal limits.  She states her breathing status has improved and she no longer has any significant dyspnea on exertion.  Home Medications    Current Outpatient Medications  Medication Sig Dispense Refill   alendronate (FOSAMAX) 70 MG tablet Take 1 tablet (70 mg total) by mouth every 7 (seven) days. Take with a full glass of water on an empty stomach. 13 tablet 3   aspirin 81 MG EC tablet Take 1 tablet (81 mg total) by mouth daily. Swallow whole. 30 tablet 12   brimonidine (ALPHAGAN P) 0.1 % SOLN Place 1 drop into both eyes 2 (two) times daily.     Efinaconazole 10 % SOLN Apply 1 drop topically daily. 4 mL 11   timolol (TIMOPTIC) 0.5 % ophthalmic solution 1 drop 2 (two) times daily.     Vitamin D, Ergocalciferol, (DRISDOL) 1.25 MG (50000 UNIT) CAPS capsule Take 1 capsule (50,000 Units total) by mouth every 7 (seven) days. 4 capsule 0   ezetimibe-simvastatin (VYTORIN) 10-40 MG tablet TAKE 1 TABLET BY MOUTH DAILY. 30 tablet 10   No current facility-administered medications for this visit.     Family History    Family History  Problem Relation Age of Onset   Cancer Mother        breast   Hyperlipidemia Mother  Colon polyps Mother    Heart disease Father    Diabetes Father    Hyperlipidemia Father    Hypertension Father    Colon polyps Father    Kidney disease Father    Thyroid disease Father    Lupus Maternal Aunt    Lupus Cousin        mother and father's side   Diabetes Other    Arthritis Other    Dementia Other    Cancer Other        2 uncles with lung cancer   Heart disease Other        2 uncles with heart disease   Colon cancer Neg Hx    Esophageal cancer Neg Hx    Stomach cancer Neg Hx    Rectal cancer Neg Hx    She indicated that her mother is alive. She indicated that her father is alive. She indicated that her sister  is alive. She indicated that all of her three brothers are alive. She indicated that her son is alive. She indicated that the status of her maternal aunt is unknown. She indicated that the status of her cousin is unknown. She indicated that the status of her neg hx is unknown.  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   Patient is right handed.   Patient does not drink caffeine.   Social Determinants of Health   Financial Resource Strain: Low Risk  (06/02/2021)   Overall Financial Resource Strain (CARDIA)    Difficulty of Paying Living Expenses: Not hard at all  Food Insecurity: No Food Insecurity (06/02/2021)   Hunger Vital Sign    Worried About Running Out of Food in the Last Year: Never true    Ran Out of Food in the Last Year: Never true  Transportation Needs: No Transportation Needs (06/02/2021)   PRAPARE - Hydrologist (Medical): No    Lack of Transportation (Non-Medical): No  Physical Activity: Sufficiently Active (06/02/2021)   Exercise Vital Sign    Days of Exercise per Week: 5 days    Minutes of Exercise per Session: 40 min  Stress: No Stress Concern Present (06/02/2021)   [place]    Feeling of Stress : Not at all  Social Connections: [place]  (06/02/2021)   Social Connection and Isolation Panel [NHANES]    Frequency of Communication with Friends and Family: More than three times a week    Frequency of Social Gatherings with Friends and Family: More than three times a week    Attends Religious Services: More than 4 times per year    Active Member of [employer] or Organizations: Yes  Attends Archivist Meetings: More than 4 times per year    Marital Status: Married  Human resources officer Violence: Not At Risk (06/02/2021)   Humiliation, Afraid, Rape, and Kick questionnaire    Fear of Current or Ex-Partner: No    Emotionally Abused: No    Physically Abused: No    Sexually Abused: No     Review of Systems    General:  No chills, fever, night sweats or weight changes.  Cardiovascular:  No chest pain, dyspnea on exertion, edema, orthopnea, palpitations, paroxysmal nocturnal dyspnea. Dermatological: No rash, lesions/masses Respiratory: No cough, dyspnea Urologic: No hematuria, dysuria Abdominal:   No nausea, vomiting, diarrhea, bright red blood per rectum, melena, or hematemesis Neurologic:  No visual changes, wkns, changes in mental status. All other systems reviewed and are otherwise negative except as noted above.     Physical Exam    VS:  BP 100/70   Pulse 69   Ht '5\' 4"'$  (1.626 m)   Wt 227 lb 6.4 oz (103.1 kg)   SpO2 100%   BMI 39.03 kg/m  , BMI Body mass index is 39.03 kg/m.     GEN: Well nourished, well developed, in no acute distress. HEENT: normal. Neck: Supple, no JVD, carotid bruits, or masses. Cardiac: RRR, no murmurs, rubs, or gallops. No clubbing, cyanosis, edema.  Radials/DP/PT 2+ and equal bilaterally.  Respiratory:  Respirations regular and unlabored, clear to auscultation bilaterally. GI: Soft, nontender, nondistended, BS + x 4. MS: no deformity or atrophy. Skin: warm and dry, no rash. Neuro:  Strength and sensation are intact. Psych: Normal affect.  Accessory Clinical Findings      Lab  Results  Component Value Date   WBC 5.8 01/23/2022   HGB 13.2 01/23/2022   HCT 39.6 01/23/2022   MCV 92.1 01/23/2022   PLT 266.0 01/23/2022   Lab Results  Component Value Date   CREATININE 0.55 (L) 04/22/2022   BUN 6 04/22/2022   NA 141 04/22/2022   K 4.4 04/22/2022   CL 103 04/22/2022   CO2 19 (L) 04/22/2022   Lab Results  Component Value Date   ALT 25 04/22/2022   AST 30 04/22/2022   ALKPHOS 89 04/22/2022   BILITOT 0.3 04/22/2022   Lab Results  Component Value Date   CHOL 166 04/22/2022   HDL 57 04/22/2022   LDLCALC 88 04/22/2022   LDLDIRECT 184.9 08/04/2012   TRIG 115 04/22/2022   CHOLHDL 3 01/23/2022    Lab Results  Component Value Date   HGBA1C 5.9 (H) 04/22/2022    Review of Prior Studies: IMPRESSIONS - Moderate obstructive sleep apnea occurred during this study (AHI 18.4/h). - Mild oxygen desaturation to a  nadir of 86%. - Patient snored for 102.3 minutes (22.4%) during the sleep.   DIAGNOSIS - Obstructive Sleep Apnea (G47.33)   RECOMMENDATIONS - Recommend therapeutic CPAP for treatment of her moderate sleep disordered breathing. Can initiate a trial of Auto-PAP with EPR of 3 at 7 - 16 cm of water. - Effort should be made to optimize nasal and oropharyngeal patency. - Avoid alcohol, sedatives and other CNS depressants that may worsen sleep apnea and disrupt normal sleep architecture. - Sleep hygiene should be reviewed to assess factors that may improve sleep quality. - Weight management (BMI 38) and regular exercise should be initiated or continued. - Recommend a download and sleep clinic evaluation after 4 weeks of therapy.   Assessment & Plan   1.  Hypotension: This is baseline for her.  She is asymptomatic.  She is not on any antihypertensive medications.  2.  OSA: Has not received CPAP machine although this was to be ordered in January 2023.  Message sent to have this followed up on so that she can receive her equipment and be followed by sleep  medicine.  3.  Hyperlipidemia: Currently well controlled with most recent labs as above.  No changes in her medication regimen.  Refills for Vytorin.  Current medicines are reviewed at length with the patient today.  I have spent 20  min's  dedicated to the care of this patient on the date of this encounter to include pre-visit review of records, assessment, management and diagnostic testing,with shared decision making.  Signed, Phill Myron. West Pugh, ANP, [place]   06/06/2022 12:36 PM      Office [phone] Fax [phone]  Notice: This dictation was prepared with Dragon dictation along with smaller phrase technology. Any transcriptional errors that result from this process are unintentional and may not be corrected upon review.

## 2022-06-06 ENCOUNTER — Ambulatory Visit: Payer: PRIVATE HEALTH INSURANCE | Attending: Internal Medicine | Admitting: Adult Health

## 2022-06-06 ENCOUNTER — Encounter: Payer: Self-pay | Admitting: Adult Health

## 2022-06-06 VITALS — BP 100/70 | HR 69 | Ht 64.0 in | Wt 227.4 lb

## 2022-06-06 DIAGNOSIS — E669 Obesity, unspecified: Secondary | ICD-10-CM

## 2022-06-06 DIAGNOSIS — G4733 Obstructive sleep apnea (adult) (pediatric): Secondary | ICD-10-CM | POA: Diagnosis not present

## 2022-06-06 DIAGNOSIS — I950 Idiopathic hypotension: Secondary | ICD-10-CM | POA: Diagnosis not present

## 2022-06-06 DIAGNOSIS — E7800 Pure hypercholesterolemia, unspecified: Secondary | ICD-10-CM

## 2022-06-06 DIAGNOSIS — I2510 Atherosclerotic heart disease of native coronary artery without angina pectoris: Secondary | ICD-10-CM

## 2022-06-06 DIAGNOSIS — I2584 Coronary atherosclerosis due to calcified coronary lesion: Secondary | ICD-10-CM

## 2022-06-06 MED ORDER — EZETIMIBE-SIMVASTATIN 10-40 MG PO TABS: ORAL_TABLET | ORAL | 10 refills | Status: DC

## 2022-06-06 NOTE — Patient Instructions (Signed)
Medication Instructions:  Your physician recommends that you continue on your current medications as directed. Please refer to the Current Medication list given to you today.  *If you need a refill on your cardiac medications before your next appointment, please call your pharmacy*   Lab Work: NONE If you have labs (blood work) drawn today and your tests are completely normal, you will receive your results only by: [place] (if you have MyChart) OR A paper copy in the mail If you have any lab test that is abnormal or we need to change your treatment, we will call you to review the results.   Testing/Procedures: NONE   Follow-Up: At [facility], you and your health needs are our priority.  As part of our continuing mission to provide you with exceptional heart care, we have created designated Provider Care Teams.  These Care Teams include your primary Cardiologist (physician) and Advanced Practice Providers (APPs -  Physician Assistants and Nurse Practitioners) who all work together to provide you with the care you need, when you need it.  We recommend signing up for the patient portal called "MyChart".  Sign up information is provided on this After Visit Summary.  MyChart is used to connect with patients for Virtual Visits (Telemedicine).  Patients are able to view lab/test results, encounter notes, upcoming appointments, etc.  Non-urgent messages can be sent to your provider as well.   To learn more about what you can do with MyChart, go to [email or web address].    Your next appointment:   6 month(s)  The format for your next appointment:   In Person  Provider:   Pixie Casino, MD

## 2022-06-07 NOTE — Progress Notes (Signed)
Chief Complaint:   OBESITY [name] is here to discuss her progress with her obesity treatment plan along with follow-up of her obesity related diagnoses. [name] is on the Category 1 Plan with lunch options and protein equivalents and states she is following her eating plan approximately 98% of the time. [name] states she is walking 30 minutes 3 times per week.  Today's visit was #: 4 Starting weight: 242 lbs Starting date: 04/22/2022 Today's weight: 223 lbs Today's date: 06/03/2022 Total lbs lost to date: 19 Total lbs lost since last in-office visit: 6  Interim History: [name] hit all 3 goals we made last OV, but still struggles to get her water in each day. She lost 5.6 lbs in fat mass and denies hunger or cravings. Pt also reports less fatigue and less SOB with activities. She says, "I am enjoying the foods."  Subjective:   1. Pre-diabetes [name] has a diagnosis of prediabetes based on her elevated HgA1c and was informed this puts her at greater risk of developing diabetes. She continues to work on diet and exercise to decrease her risk of diabetes. She denies nausea or hypoglycemia. She denies hunger or cravings.  2. Vitamin D deficiency She is currently taking OTC vitamin D 4000 IU each day. She denies nausea, vomiting or muscle weakness.  3. Vitamin B12 deficiency Pt's B12 level was 1,248 on 04/22/2022.  Assessment/Plan:   1. Pre-diabetes [name] will continue to work on prudent nutritional plan, weight loss, exercise, and decreasing simple carbohydrates to help decrease the risk of diabetes.   2. Vitamin D deficiency Low Vitamin D level contributes to fatigue and are associated with obesity, breast, and colon cancer. She agrees to continue to take OTC Vitamin D 4,000 IU daily and will follow-up for routine testing of Vitamin D, at least 2-3 times per year to avoid over-replacement.  3. Vitamin B12 deficiency The diagnosis was reviewed with the patient. Counseling provided  today, see below. We will continue to monitor. Orders and follow up as documented in patient record. Continue OTC B12 500 mcg daily.  Counseling The body needs vitamin B12: to make red blood cells; to make DNA; and to help the nerves work properly so they can carry messages from the brain to the body.  The main causes of vitamin B12 deficiency include dietary deficiency, digestive diseases, pernicious anemia, and having a surgery in which part of the stomach or small intestine is removed.  Certain medicines can make it harder for the body to absorb vitamin B12. These medicines include: heartburn medications; some antibiotics; some medications used to treat diabetes, gout, and high cholesterol.  In some cases, there are no symptoms of this condition. If the condition leads to anemia or nerve damage, various symptoms can occur, such as weakness or fatigue, shortness of breath, and numbness or tingling in your hands and feet.   Treatment:  May include taking vitamin B12 supplements.  Avoid alcohol.  Eat lots of healthy foods that contain vitamin B12: Beef, pork, chicken, [place], and organ meats, such as liver.  Seafood: This includes clams, rainbow trout, salmon, tuna, and haddock. Eggs.  Cereal and dairy products that are fortified: This means that vitamin B12 has been added to the food.   4. Obesity, current BMI 38.4 [name] is currently in the action stage of change. As such, her goal is to continue with weight loss efforts. She has agreed to the Category 1 Plan with lunch options and protein equivalents.   Exercise  goals:  As is  Behavioral modification strategies: increasing lean protein intake and decreasing simple carbohydrates.  [name] has agreed to follow-up with our clinic in 2 weeks. She was informed of the importance of frequent follow-up visits to maximize her success with intensive lifestyle modifications for her multiple health conditions.   Objective:   Blood pressure 113/79,  pulse 78, temperature 97.8 F (36.6 C), temperature source Oral, height '5\' 4"'$  (1.626 m), weight 223 lb 12.8 oz (101.5 kg), SpO2 94 %. Body mass index is 38.42 kg/m.  General: Cooperative, alert, well developed, in no acute distress. HEENT: Conjunctivae and lids unremarkable. Cardiovascular: Regular rhythm.  Lungs: Normal work of breathing. Neurologic: No focal deficits.   Lab Results  Component Value Date   CREATININE 0.55 (L) 04/22/2022   BUN 6 04/22/2022   NA 141 04/22/2022   K 4.4 04/22/2022   CL 103 04/22/2022   CO2 19 (L) 04/22/2022   Lab Results  Component Value Date   ALT 25 04/22/2022   AST 30 04/22/2022   ALKPHOS 89 04/22/2022   BILITOT 0.3 04/22/2022   Lab Results  Component Value Date   HGBA1C 5.9 (H) 04/22/2022   HGBA1C 6.0 01/23/2022   HGBA1C 5.8 07/26/2021   HGBA1C 5.8 01/25/2021   HGBA1C 5.8 07/01/2019   Lab Results  Component Value Date   INSULIN 8.7 04/22/2022   Lab Results  Component Value Date   TSH 0.919 04/22/2022   Lab Results  Component Value Date   CHOL 166 04/22/2022   HDL 57 04/22/2022   LDLCALC 88 04/22/2022   LDLDIRECT 184.9 08/04/2012   TRIG 115 04/22/2022   CHOLHDL 3 01/23/2022   Lab Results  Component Value Date   VD25OH 36.3 04/22/2022   VD25OH 44.00 01/23/2022   VD25OH 36.82 10/25/2021   Lab Results  Component Value Date   WBC 5.8 01/23/2022   HGB 13.2 01/23/2022   HCT 39.6 01/23/2022   MCV 92.1 01/23/2022   PLT 266.0 01/23/2022   Lab Results  Component Value Date   IRON 69 07/01/2019    Obesity Behavioral Intervention:   Approximately 15 minutes were spent on the discussion below.  ASK: We discussed the diagnosis of obesity with [name] today and [name] agreed to give [place] permission to discuss obesity behavioral modification therapy today.  ASSESS: [name] has the diagnosis of obesity and her BMI today is 38.4. [name] is in the action stage of change.   ADVISE: [name] was educated on the multiple health  risks of obesity as well as the benefit of weight loss to improve her health. She was advised of the need for long term treatment and the importance of lifestyle modifications to improve her current health and to decrease her risk of future health problems.  AGREE: Multiple dietary modification options and treatment options were discussed and [name] agreed to follow the recommendations documented in the above note.  ARRANGE: [name] was educated on the importance of frequent visits to treat obesity as outlined per CMS and USPSTF guidelines and agreed to schedule her next follow up appointment today.  Attestation Statements:   Reviewed by clinician on day of visit: allergies, medications, problem list, medical history, surgical history, family history, social history, and previous encounter notes.  I, Kathlene November, BS, CMA, am acting as transcriptionist for [employer], DO.  I have reviewed the above documentation for accuracy and completeness, and I agree with the above. Marjory Sneddon, D.O.  The 21st Century Cures Act was signed into  law in 2016 which includes the topic of electronic health records.  This provides immediate access to information in MyChart.  This includes consultation notes, operative notes, office notes, lab results and pathology reports.  If you have any questions about what you read please let us know at your next visit so we can discuss your concerns and take corrective action if need be.  We are right here with you.

## 2022-06-09 NOTE — Patient Instructions (Signed)

## 2022-06-09 NOTE — Progress Notes (Signed)
Subjective:   [name] is a 56 y.o. female who presents for Medicare Annual (Subsequent) preventive examination. I connected with  [name] on 06/10/22 by a audio enabled telemedicine application and verified that I am speaking with the correct person using two identifiers.  Patient Location: Home  Provider Location: Home Office  I discussed the limitations of evaluation and management by telemedicine. The patient expressed understanding and agreed to proceed.  Review of Systems    Deferred to PCP Cardiac Risk Factors include: advanced age (>20mn, >>17women);dyslipidemia;obesity (BMI >30kg/m2)     Objective:    There were no vitals filed for this visit. There is no height or weight on file to calculate BMI.     06/10/2022    9:58 AM 06/02/2021   10:51 AM 03/14/2016    7:57 PM 05/30/2015   10:08 AM 05/24/2015    3:50 AM 05/20/2015   12:43 PM 05/08/2015   11:55 AM  Advanced Directives  Does Patient Have a Medical Advance Directive? No No No No No No No  Would patient like information on creating a medical advance directive? No - Patient declined Yes (ED - Information included in AVS) No - patient declined information  No - patient declined information No - patient declined information No - patient declined information    Current Medications (verified) Outpatient Encounter Medications as of 06/10/2022  Medication Sig   alendronate (FOSAMAX) 70 MG tablet Take 1 tablet (70 mg total) by mouth every 7 (seven) days. Take with a full glass of water on an empty stomach.   aspirin 81 MG EC tablet Take 1 tablet (81 mg total) by mouth daily. Swallow whole.   brimonidine (ALPHAGAN P) 0.1 % SOLN Place 1 drop into both eyes 2 (two) times daily.   Efinaconazole 10 % SOLN Apply 1 drop topically daily.   ezetimibe-simvastatin (VYTORIN) 10-40 MG tablet TAKE 1 TABLET BY MOUTH DAILY.   timolol (TIMOPTIC) 0.5 % ophthalmic solution 1 drop 2 (two) times daily.   Vitamin D, Ergocalciferol,  (DRISDOL) 1.25 MG (50000 UNIT) CAPS capsule Take 1 capsule (50,000 Units total) by mouth every 7 (seven) days.   No facility-administered encounter medications on file as of 06/10/2022.    Allergies (verified) Topamax   History: Past Medical History:  Diagnosis Date   ANXIETY 03/27/2010   BACK PAIN, CHRONIC 03/27/2010   Classic migraine with aura 05/30/2015   COMMON MIGRAINE 03/27/2010   [place]   FATIGUE 03/27/2010   GERD 03/27/2010   Glaucoma    bilateral   HYPERLIPIDEMIA 03/27/2010   [place] 10/23[place]   unconfirmed, but suspected   Myocardial infarction ([facility]    "old"   Obesity    Osteoporosis 09/24/2012   Palpitations    Unspecified vitamin D deficiency 10/23/2010   Past Surgical History:  Procedure Laterality Date   ABDOMINAL HYSTERECTOMY     LEFT HEART CATHETERIZATION WITH CORONARY ANGIOGRAM N/A 03/31/2014   Procedure: LEFT HEART CATHETERIZATION WITH CORONARY ANGIOGRAM;  Surgeon: KPixie Casino MD;  [facility];  Service: Cardiovascular;  Laterality: N/A;   Neg stress test  2007   s/p eye surgury     s/p knee surgury     Family History  Problem Relation Age of Onset   Cancer Mother        breast   Hyperlipidemia Mother    Colon polyps Mother    Heart disease Father    Diabetes Father  Hyperlipidemia Father    Hypertension Father    Colon polyps Father    Kidney disease Father    Thyroid disease Father    Lupus Maternal Aunt    Lupus Cousin        mother and father's side   Diabetes Other    Arthritis Other    Dementia Other    Cancer Other        2 uncles with lung cancer   Heart disease Other        2 uncles with heart disease   Colon cancer Neg Hx    Esophageal cancer Neg Hx    Stomach cancer Neg Hx    Rectal cancer Neg Hx    Social History   Socioeconomic History   Marital status: Married    Spouse name: [name]   Number of children: 1   Years of education: College   Highest  education level: Not on file  Occupational History   Occupation: Disabled    Comment:  due to "MS" since 2005   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   Patient is right handed.   Patient does not drink caffeine.   Social Determinants of Health   Financial Resource Strain: Low Risk  (06/10/2022)   Overall Financial Resource Strain (CARDIA)    Difficulty of Paying Living Expenses: Not very hard  Food Insecurity: No Food Insecurity (06/10/2022)   Hunger Vital Sign    Worried About Running Out of Food in the Last Year: Never true    Ran Out of Food in the Last Year: Never true  Transportation Needs: No Transportation Needs (06/10/2022)   PRAPARE - Hydrologist (Medical): No    Lack of Transportation (Non-Medical): No  Physical Activity: Insufficiently Active (06/10/2022)   Exercise Vital Sign    Days of Exercise per Week: 2 days    Minutes of Exercise per Session: 30 min  Stress: No Stress Concern Present (06/10/2022)   [place]    Feeling of Stress : Only a little  Social Connections: Socially Integrated (06/10/2022)   Social Connection and Isolation Panel [NHANES]    Frequency of Communication with Friends and Family: More than three times a week    Frequency of Social Gatherings with Friends and Family: More than three times a week    Attends Religious Services: More than 4 times per year    Active Member of [employer] or Organizations: Yes    Attends Music therapist: More than 4 times per year    Marital Status:  Married    Tobacco Counseling Counseling given: Not Answered   Clinical Intake:  Pre-visit preparation completed: Yes  Pain : No/denies pain     Nutritional Status: BMI > 30  Obese Nutritional Risks: None Diabetes: No  How often do you need to have someone help you when you read instructions, pamphlets, or other written materials from your doctor or pharmacy?: 1 - Never What is the last  grade level you completed in school?: College  Diabetic?No  Interpreter Needed?: No  Information entered by :: Tammy Loron RN   Activities of Daily Living    06/10/2022    9:56 AM  In your present state of health, do you have any difficulty performing the following activities:  Hearing? 0  Vision? 0  Difficulty concentrating or making decisions? 0  Walking or climbing stairs? 0  Dressing or bathing? 0  Doing errands, shopping? 0  Preparing Food and eating ? N  Using the Toilet? N  In the past six months, have you accidently leaked urine? N  Do you have problems with loss of bowel control? N  Managing your Medications? N  Managing your Finances? N  Housekeeping or managing your Housekeeping? N    Patient Care Team: Biagio Borg, MD as PCP - General Hilty, Nadean Corwin, MD as PCP - Cardiology (Cardiology)  Indicate any recent Medical Services you may have received from other than [facility] providers in the past year (date may be approximate).     Assessment:   This is a routine wellness examination for [name].  Hearing/Vision screen No results found.  Dietary issues and exercise activities discussed: Current Exercise Habits: The patient does not participate in regular exercise at present, Exercise limited by: orthopedic condition(s)   Goals Addressed             This Visit's Progress    Patient Stated       I want to increase my physical activity by starting to go to the gym.      Depression Screen    06/10/2022   10:07 AM 04/22/2022    7:53 AM 01/23/2022   10:10 AM  06/02/2021   10:47 AM 01/25/2021   10:29 AM 01/25/2021   10:01 AM 07/01/2019    8:50 AM  PHQ 2/9 Scores  PHQ - 2 Score 0 1 0 0 0 0 0  PHQ- 9 Score  8 4        Fall Risk    06/10/2022    9:59 AM 01/23/2022   10:10 AM 06/02/2021   10:47 AM 01/25/2021   10:28 AM 07/01/2019    8:50 AM  Fall Risk   Falls in the past year? 0 0 0 0 0  Number falls in past yr: 0 0 0 0   Injury with Fall? 0 0 0 0   Risk for fall due to : History of fall(s)      Follow up Education provided        FALL RISK PREVENTION PERTAINING TO THE HOME:  Any stairs in or around the home? Yes  If so, are there any without handrails? Yes  Home free of loose throw rugs in walkways, pet beds, electrical cords, etc? Yes  Adequate lighting in your home to reduce risk of falls? Yes   ASSISTIVE DEVICES UTILIZED TO PREVENT FALLS:  Life alert? No  Use of a cane, walker or w/c? No  Grab bars in the bathroom? No  Shower chair or bench in shower? No  Elevated toilet seat or a handicapped toilet? No   Cognitive Function:        06/10/2022    9:59 AM 06/02/2021   10:51 AM  6CIT Screen  What Year? 0 points 0 points  What month? 0 points 0 points  What time? 0 points 0 points  Count back from 20 0 points 2 points  Months in reverse 0 points 0 points  Repeat phrase 0 points 4 points  Total Score 0 points 6 points    Immunizations Immunization History  Administered Date(s) Administered   Influenza,inj,Quad PF,6+ Mos 07/01/2019, 07/26/2021   PFIZER(Purple Top)SARS-COV-2 Vaccination 11/19/2019, 12/13/2019, 07/09/2020   Pfizer Covid-19 Vaccine Bivalent Booster 47yr & up 07/08/2021   Td 03/27/2010   Tdap 01/25/2021    TDAP status: Up to date  Flu Vaccine status: Due, Education has been provided regarding the importance of this vaccine. Advised may receive this vaccine at local pharmacy or Health Dept. Aware to provide a copy of the vaccination record if obtained from local pharmacy or Health Dept. Verbalized  acceptance and understanding.  Covid-19 vaccine status: Information provided on how to obtain vaccines.   Qualifies for Shingles Vaccine? Yes   Zostavax completed No   Shingrix Completed?: No.    Education has been provided regarding the importance of this vaccine. Patient has been advised to call insurance company to determine out of pocket expense if they have not yet received this vaccine. Advised may also receive vaccine at local pharmacy or Health Dept. Verbalized acceptance and understanding.  Screening Tests Health Maintenance  Topic Date Due   Zoster Vaccines- Shingrix (1 of 2) Never done   COVID-19 Vaccine (5 - Pfizer risk series) 09/02/2021   INFLUENZA VACCINE  12/07/2022 (Originally 04/08/2022)   COLONOSCOPY (Pts 45-436yrInsurance coverage will need to be confirmed)  09/11/2022   MAMMOGRAM  12/10/2023   PAP SMEAR-Modifier  01/31/2024   TETANUS/TDAP  01/26/2031   Hepatitis C Screening  Completed   HIV Screening  Completed   HPV VACCINES  Aged Out    Health Maintenance  Health Maintenance Due  Topic Date Due   Zoster Vaccines- Shingrix (1 of 2) Never done   COVID-19 Vaccine (5 - Pfizer risk series) 09/02/2021    Colorectal cancer screening: Type of screening: Colonoscopy. Completed 09/12/19. Repeat every 3 years  Mammogram status: Completed 12/09/21. Repeat every year  Bone Density status: Completed 02/08/21. Results reflect: Bone density results: OSTEOPOROSIS. Repeat every 2 years.  Lung Cancer Screening: (Low Dose CT Chest recommended if Age 57-80ears, 30 pack-year currently smoking OR have quit w/in 15years.) does not qualify.   Additional Screening:  Hepatitis C Screening: does qualify; Completed 07/26/21  Vision Screening: Recommended annual ophthalmology exams for early detection of glaucoma and other disorders of the eye. Is the patient up to date with their annual eye exam?  Yes  Who is the provider or what is the name of the office in which the patient  attends annual eye exams? [name] pt is not established with a provider, would they like to be referred to a provider to establish care?  N/A .   Dental Screening: Recommended annual dental exams for proper oral hygiene  Community Resource Referral / Chronic Care Management: CRR required this visit?  No   CCM required this visit?  No      Plan:     I have personally reviewed and noted the following in the patient's chart:   Medical and social history Use of alcohol, tobacco or illicit drugs  Current medications and supplements including opioid prescriptions. Patient is not currently taking opioid prescriptions. Functional ability and status Nutritional status Physical activity Advanced directives List of other physicians Hospitalizations, surgeries, and ER visits in previous 12 months Vitals Screenings to include cognitive, depression, and falls Referrals and appointments  In addition, I have reviewed and discussed with patient certain preventive protocols, quality metrics, and  best practice recommendations. A written personalized care plan for preventive services as well as general preventive health recommendations were provided to patient.     Tammy Cowboy, RN   06/10/2022   Nurse Notes:  [name] , Thank you for taking time to come for your Medicare Wellness Visit. I appreciate your ongoing commitment to your health goals. Please review the following plan we discussed and let me know if I can assist you in the future.   These are the goals we discussed:  Goals      Patient Stated     I want to increase my physical activity by starting to go to the gym.        This is a list of the screening recommended for you and due dates:  Health Maintenance  Topic Date Due   Zoster (Shingles) Vaccine (1 of 2) Never done   COVID-19 Vaccine (5 - Pfizer risk series) 09/02/2021   Flu Shot  12/07/2022*   Colon Cancer Screening  09/11/2022   Mammogram  12/10/2023   Pap  Smear  01/31/2024   Tetanus Vaccine  01/26/2031   Hepatitis C Screening: USPSTF Recommendation to screen - Ages 18-79 yo.  Completed   HIV Screening  Completed   HPV Vaccine  Aged Out  *Topic was postponed. The date shown is not the original due date.

## 2022-06-10 ENCOUNTER — Ambulatory Visit (INDEPENDENT_AMBULATORY_CARE_PROVIDER_SITE_OTHER): Payer: PRIVATE HEALTH INSURANCE | Admitting: *Deleted

## 2022-06-10 DIAGNOSIS — Z0000 Encounter for general adult medical examination without abnormal findings: Secondary | ICD-10-CM

## 2022-06-15 ENCOUNTER — Other Ambulatory Visit (INDEPENDENT_AMBULATORY_CARE_PROVIDER_SITE_OTHER): Payer: Self-pay | Admitting: Family Medicine

## 2022-06-15 DIAGNOSIS — E559 Vitamin D deficiency, unspecified: Secondary | ICD-10-CM

## 2022-06-19 ENCOUNTER — Ambulatory Visit (INDEPENDENT_AMBULATORY_CARE_PROVIDER_SITE_OTHER): Payer: PRIVATE HEALTH INSURANCE | Admitting: Family Medicine

## 2022-06-19 ENCOUNTER — Encounter (INDEPENDENT_AMBULATORY_CARE_PROVIDER_SITE_OTHER): Payer: Self-pay | Admitting: Family Medicine

## 2022-06-19 VITALS — BP 101/71 | Ht 64.0 in | Wt 221.0 lb

## 2022-06-19 DIAGNOSIS — E669 Obesity, unspecified: Secondary | ICD-10-CM | POA: Diagnosis not present

## 2022-06-19 DIAGNOSIS — R7303 Prediabetes: Secondary | ICD-10-CM | POA: Diagnosis not present

## 2022-06-19 DIAGNOSIS — Z6838 Body mass index (BMI) 38.0-38.9, adult: Secondary | ICD-10-CM | POA: Diagnosis not present

## 2022-06-19 DIAGNOSIS — E559 Vitamin D deficiency, unspecified: Secondary | ICD-10-CM

## 2022-06-19 DIAGNOSIS — Z6841 Body Mass Index (BMI) 40.0 and over, adult: Secondary | ICD-10-CM

## 2022-06-19 MED ORDER — VITAMIN D (ERGOCALCIFEROL) 1.25 MG (50000 UNIT) PO CAPS: 50000.0000 [IU] | ORAL_CAPSULE | ORAL | 0 refills | Status: DC

## 2022-07-03 ENCOUNTER — Ambulatory Visit (INDEPENDENT_AMBULATORY_CARE_PROVIDER_SITE_OTHER): Payer: PRIVATE HEALTH INSURANCE | Admitting: Family Medicine

## 2022-07-03 ENCOUNTER — Encounter (INDEPENDENT_AMBULATORY_CARE_PROVIDER_SITE_OTHER): Payer: Self-pay | Admitting: Family Medicine

## 2022-07-03 VITALS — BP 109/76 | HR 63 | Temp 97.8°F | Ht 64.0 in | Wt 220.0 lb

## 2022-07-03 DIAGNOSIS — E559 Vitamin D deficiency, unspecified: Secondary | ICD-10-CM

## 2022-07-03 DIAGNOSIS — E669 Obesity, unspecified: Secondary | ICD-10-CM

## 2022-07-03 DIAGNOSIS — Z6837 Body mass index (BMI) 37.0-37.9, adult: Secondary | ICD-10-CM | POA: Diagnosis not present

## 2022-07-03 MED ORDER — VITAMIN D (ERGOCALCIFEROL) 1.25 MG (50000 UNIT) PO CAPS: 50000.0000 [IU] | ORAL_CAPSULE | ORAL | 0 refills | Status: DC

## 2022-07-04 ENCOUNTER — Other Ambulatory Visit: Payer: Self-pay | Admitting: Internal Medicine

## 2022-07-04 DIAGNOSIS — Z86010 Personal history of colonic polyps: Secondary | ICD-10-CM

## 2022-07-07 ENCOUNTER — Encounter: Payer: Self-pay | Admitting: Gastroenterology

## 2022-07-07 NOTE — Progress Notes (Unsigned)
Chief Complaint:   OBESITY [name] is here to discuss her progress with her obesity treatment plan along with follow-up of her obesity related diagnoses. [name] is on the Category 1 Plan with lunch options and protein equivalents and states she is following her eating plan approximately 98% of the time. [name] states she is walking 30 minutes 3 times per week.  Today's visit was #: 5 Starting weight: 242 lbs Starting date: 04/22/2022 Today's weight: 221 lbs Today's date: 06/19/2022 Total lbs lost to date: 21 Total lbs lost since last in-office visit: 2  Interim History: [name] is only able to drink 28 oz of water because she urinates a lot when she drinks a lot. Meal plan is going great! [name] is weighing proteins and measuring vegetables. She denies hunger or cravings. [name] lost 3.5 lbs in fat mass and gained in muscle mass.  Subjective:   1. Pre-diabetes [name] has been excellent with following the meal plan and thus, has no hunger or cravings at this time. Her A1c 4 months ago was 6.0.  2. Vitamin D deficiency [name] is tolerating medication(s) well without side effects.  Medication compliance is good as patient endorses taking it as prescribed.  The patient denies additional concerns regarding this condition.      Assessment/Plan:  No orders of the defined types were placed in this encounter.   Medications Discontinued During This Encounter  Medication Reason   Vitamin D, Ergocalciferol, (DRISDOL) 1.25 MG (50000 UNIT) CAPS capsule Reorder     Meds ordered this encounter  Medications   DISCONTD: Vitamin D, Ergocalciferol, (DRISDOL) 1.25 MG (50000 UNIT) CAPS capsule    Sig: Take 1 capsule (50,000 Units total) by mouth every 7 (seven) days.    Dispense:  4 capsule    Refill:  0     1. Pre-diabetes [name] will continue to work on weight loss, exercise, and decreasing simple carbohydrates to help decrease the risk of diabetes.  No need for medication to aid in weight  loss journey at this time because [name] is using food as her medicine and prefers it that way. She is very motivated for change.  2. Vitamin D deficiency Low Vitamin D level contributes to fatigue and are associated with obesity, breast, and colon cancer. She agrees to continue to take prescription Vitamin D '@50'$ ,000 IU every week and will follow-up for routine testing of Vitamin D, at least 2-3 times per year to avoid over-replacement.  3. Obesity, current BMI 38 [name] is currently in the action stage of change. As such, her goal is to continue with weight loss efforts. She has agreed to the Category 1 Plan with lunch options and protein equivalents.   [name] will start resistance training.   Exercise goals:  Add strength training to cardio workouts. Do 2 sets of 10 reps 2-3 days a week. [name] given [employer] handout.  Behavioral modification strategies: increasing water intake and planning for success.  [name] has agreed to follow-up with our clinic in 2 weeks. She was informed of the importance of frequent follow-up visits to maximize her success with intensive lifestyle modifications for her multiple health conditions.   Objective:   Blood pressure 101/71, height '5\' 4"'$  (1.626 m), weight 221 lb (100.2 kg). Body mass index is 37.93 kg/m.  General: Cooperative, alert, well developed, in no acute distress. HEENT: Conjunctivae and lids unremarkable. Cardiovascular: Regular rhythm.  Lungs: Normal work of breathing. Neurologic: No focal deficits.   Lab Results  Component  Value Date   CREATININE 0.55 (L) 04/22/2022   BUN 6 04/22/2022   NA 141 04/22/2022   K 4.4 04/22/2022   CL 103 04/22/2022   CO2 19 (L) 04/22/2022   Lab Results  Component Value Date   ALT 25 04/22/2022   AST 30 04/22/2022   ALKPHOS 89 04/22/2022   BILITOT 0.3 04/22/2022   Lab Results  Component Value Date   HGBA1C 5.9 (H) 04/22/2022   HGBA1C 6.0 01/23/2022   HGBA1C 5.8 07/26/2021   HGBA1C 5.8 01/25/2021   HGBA1C  5.8 07/01/2019   Lab Results  Component Value Date   INSULIN 8.7 04/22/2022   Lab Results  Component Value Date   TSH 0.919 04/22/2022   Lab Results  Component Value Date   CHOL 166 04/22/2022   HDL 57 04/22/2022   LDLCALC 88 04/22/2022   LDLDIRECT 184.9 08/04/2012   TRIG 115 04/22/2022   CHOLHDL 3 01/23/2022   Lab Results  Component Value Date   VD25OH 36.3 04/22/2022   VD25OH 44.00 01/23/2022   VD25OH 36.82 10/25/2021   Lab Results  Component Value Date   WBC 5.8 01/23/2022   HGB 13.2 01/23/2022   HCT 39.6 01/23/2022   MCV 92.1 01/23/2022   PLT 266.0 01/23/2022   Lab Results  Component Value Date   IRON 69 07/01/2019    Attestation Statements:   Reviewed by clinician on day of visit: allergies, medications, problem list, medical history, surgical history, family history, social history, and previous encounter notes.  I, [name], BS, CMA, am acting as transcriptionist for [employer], DO.   I have reviewed the above documentation for accuracy and completeness, and I agree with the above. [name], D.O.  The [place] was signed into law in 2016 which includes the topic of electronic health records.  This provides immediate access to information in MyChart.  This includes consultation notes, operative notes, office notes, lab results and pathology reports.  If you have any questions about what you read please let us know at your next visit so we can discuss your concerns and take corrective action if need be.  We are right here with you.

## 2022-07-15 ENCOUNTER — Ambulatory Visit (INDEPENDENT_AMBULATORY_CARE_PROVIDER_SITE_OTHER): Payer: PRIVATE HEALTH INSURANCE | Admitting: Family Medicine

## 2022-07-15 NOTE — Progress Notes (Signed)
Chief Complaint:   OBESITY [name] is here to discuss her progress with her obesity treatment plan along with follow-up of her obesity related diagnoses. [name] is on the Category 1 Plan with lunch options and states she is following her eating plan approximately 99% of the time. [name] states she is walking/bands 30 minutes 3-4 times per week.  Today's visit was #: 6 Starting weight: 242 lbs Starting date: 04/22/2022 Today's weight: 220 lbs Today's date: 07/03/2022 Total lbs lost to date: 22 lbs Total lbs lost since last in-office visit: 1  Interim History: [name] is here for a follow up office visit.  We reviewed her meal plan and all questions were answered.  Patient's food recall appears to be accurate and consistent with what is on plan when she is following it.   When eating on plan, her hunger and cravings are well controlled. She was unfortunately was only eating 2 oz protein at lunch and 4 oz at dinner. She is exercising and doing great.   Subjective:   1. Vitamin D deficiency She is currently taking prescription vitamin D 50,000 IU each week. She denies nausea, vomiting or muscle weakness.  Assessment/Plan:  No orders of the defined types were placed in this encounter.   Medications Discontinued During This Encounter  Medication Reason   Vitamin D, Ergocalciferol, (DRISDOL) 1.25 MG (50000 UNIT) CAPS capsule Reorder     Meds ordered this encounter  Medications   Vitamin D, Ergocalciferol, (DRISDOL) 1.25 MG (50000 UNIT) CAPS capsule    Sig: Take 1 capsule (50,000 Units total) by mouth every 7 (seven) days.    Dispense:  4 capsule    Refill:  0     1. Vitamin D deficiency We will refill ergocalciferol 50,000 IU once a week for 1 month with 0 refills. Low Vitamin D level contributes to fatigue and are associated with obesity, breast, and colon cancer. She agrees to continue to take prescription Vitamin D '@50'$ ,000 IU every week and will follow-up for routine  testing of Vitamin D, at least 2-3 times per year to avoid over-replacement.   -Refill Vitamin D, Ergocalciferol, (DRISDOL) 1.25 MG (50000 UNIT) CAPS capsule; Take 1 capsule (50,000 Units total) by mouth every 7 (seven) days.  Dispense: 4 capsule; Refill: 0  2. Obesity, current BMI 37.9 [name] is currently in the action stage of change. As such, her goal is to continue with weight loss efforts. She has agreed to the Category 1 Plan.   Gave her handout on Cat 1 again. She will eat 4-6 oz at lunch and 6 oz at dinner.  Exercise goals: As is.  Behavioral modification strategies: increasing lean protein intake and decreasing simple carbohydrates.  [name] has agreed to follow-up with our clinic in 3 weeks. She was informed of the importance of frequent follow-up visits to maximize her success with intensive lifestyle modifications for her multiple health conditions.   Objective:   Blood pressure 109/76, pulse 63, temperature 97.8 F (36.6 C), height '5\' 4"'$  (1.626 m), weight 220 lb (99.8 kg), SpO2 100 %. Body mass index is 37.76 kg/m.  General: Cooperative, alert, well developed, in no acute distress. HEENT: Conjunctivae and lids unremarkable. Cardiovascular: Regular rhythm.  Lungs: Normal work of breathing. Neurologic: No focal deficits.   Lab Results  Component Value Date   CREATININE 0.55 (L) 04/22/2022   BUN 6 04/22/2022   NA 141 04/22/2022   K 4.4 04/22/2022   CL 103 04/22/2022   CO2  19 (L) 04/22/2022   Lab Results  Component Value Date   ALT 25 04/22/2022   AST 30 04/22/2022   ALKPHOS 89 04/22/2022   BILITOT 0.3 04/22/2022   Lab Results  Component Value Date   HGBA1C 5.9 (H) 04/22/2022   HGBA1C 6.0 01/23/2022   HGBA1C 5.8 07/26/2021   HGBA1C 5.8 01/25/2021   HGBA1C 5.8 07/01/2019   Lab Results  Component Value Date   INSULIN 8.7 04/22/2022   Lab Results  Component Value Date   TSH 0.919 04/22/2022   Lab Results  Component Value Date   CHOL 166 04/22/2022    HDL 57 04/22/2022   LDLCALC 88 04/22/2022   LDLDIRECT 184.9 08/04/2012   TRIG 115 04/22/2022   CHOLHDL 3 01/23/2022   Lab Results  Component Value Date   VD25OH 36.3 04/22/2022   VD25OH 44.00 01/23/2022   VD25OH 36.82 10/25/2021   Lab Results  Component Value Date   WBC 5.8 01/23/2022   HGB 13.2 01/23/2022   HCT 39.6 01/23/2022   MCV 92.1 01/23/2022   PLT 266.0 01/23/2022   Lab Results  Component Value Date   IRON 69 07/01/2019   Attestation Statements:   Reviewed by clinician on day of visit: allergies, medications, problem list, medical history, surgical history, family history, social history, and previous encounter notes.  I, Brendell Tyus, am acting as Location manager for [employer], DO.   I have reviewed the above documentation for accuracy and completeness, and I agree with the above. Marjory Sneddon, D.O.  The [place] was signed into law in 2016 which includes the topic of electronic health records.  This provides immediate access to information in MyChart.  This includes consultation notes, operative notes, office notes, lab results and pathology reports.  If you have any questions about what you read please let us know at your next visit so we can discuss your concerns and take corrective action if need be.  We are right here with you.

## 2022-07-17 ENCOUNTER — Ambulatory Visit (INDEPENDENT_AMBULATORY_CARE_PROVIDER_SITE_OTHER): Payer: PRIVATE HEALTH INSURANCE | Admitting: Family Medicine

## 2022-07-23 ENCOUNTER — Ambulatory Visit (INDEPENDENT_AMBULATORY_CARE_PROVIDER_SITE_OTHER): Payer: PRIVATE HEALTH INSURANCE | Admitting: Family Medicine

## 2022-07-23 VITALS — BP 112/77 | HR 76 | Temp 98.4°F | Ht 64.0 in | Wt 213.0 lb

## 2022-07-23 DIAGNOSIS — E559 Vitamin D deficiency, unspecified: Secondary | ICD-10-CM | POA: Diagnosis not present

## 2022-07-23 DIAGNOSIS — Z6836 Body mass index (BMI) 36.0-36.9, adult: Secondary | ICD-10-CM

## 2022-07-23 DIAGNOSIS — I2584 Coronary atherosclerosis due to calcified coronary lesion: Secondary | ICD-10-CM | POA: Diagnosis not present

## 2022-07-23 DIAGNOSIS — I2510 Atherosclerotic heart disease of native coronary artery without angina pectoris: Secondary | ICD-10-CM | POA: Diagnosis not present

## 2022-07-23 DIAGNOSIS — E669 Obesity, unspecified: Secondary | ICD-10-CM

## 2022-07-23 DIAGNOSIS — R7303 Prediabetes: Secondary | ICD-10-CM

## 2022-07-24 DIAGNOSIS — R7303 Prediabetes: Secondary | ICD-10-CM | POA: Insufficient documentation

## 2022-07-24 DIAGNOSIS — E669 Obesity, unspecified: Secondary | ICD-10-CM | POA: Insufficient documentation

## 2022-07-25 ENCOUNTER — Ambulatory Visit (INDEPENDENT_AMBULATORY_CARE_PROVIDER_SITE_OTHER): Payer: PRIVATE HEALTH INSURANCE | Admitting: Internal Medicine

## 2022-07-25 ENCOUNTER — Encounter: Payer: Self-pay | Admitting: Internal Medicine

## 2022-07-25 VITALS — BP 118/62 | HR 62 | Temp 98.5°F | Ht 64.0 in | Wt 217.0 lb

## 2022-07-25 DIAGNOSIS — Z23 Encounter for immunization: Secondary | ICD-10-CM | POA: Diagnosis not present

## 2022-07-25 DIAGNOSIS — E538 Deficiency of other specified B group vitamins: Secondary | ICD-10-CM

## 2022-07-25 DIAGNOSIS — E559 Vitamin D deficiency, unspecified: Secondary | ICD-10-CM | POA: Diagnosis not present

## 2022-07-25 DIAGNOSIS — G35 Multiple sclerosis: Secondary | ICD-10-CM

## 2022-07-25 DIAGNOSIS — R42 Dizziness and giddiness: Secondary | ICD-10-CM | POA: Insufficient documentation

## 2022-07-25 DIAGNOSIS — E7801 Familial hypercholesterolemia: Secondary | ICD-10-CM

## 2022-07-25 DIAGNOSIS — R739 Hyperglycemia, unspecified: Secondary | ICD-10-CM | POA: Diagnosis not present

## 2022-07-25 DIAGNOSIS — I2584 Coronary atherosclerosis due to calcified coronary lesion: Secondary | ICD-10-CM

## 2022-07-25 DIAGNOSIS — H409 Unspecified glaucoma: Secondary | ICD-10-CM | POA: Insufficient documentation

## 2022-07-25 DIAGNOSIS — I2510 Atherosclerotic heart disease of native coronary artery without angina pectoris: Secondary | ICD-10-CM

## 2022-07-25 NOTE — Assessment & Plan Note (Signed)
Lab Results  Component Value Date   HGBA1C 5.9 (H) 04/22/2022   Stable, pt to continue current medical treatment  - diet, wt control, excercise

## 2022-07-25 NOTE — Progress Notes (Signed)
Patient ID: [name], female   DOB: 05-Apr-1966, 56 y.o.   MRN: [id number]        Chief Complaint: follow up HTN, HLD and hyperglycemia, MS       HPI:  [name] is a 56 y.o. female here overall doing ok,m Pt denies chest pain, increased sob or doe, wheezing, orthopnea, PND, increased LE swelling, palpitations, dizziness or syncope.   Pt denies polydipsia, polyuria, or new focal neuro s/s.   Due for flu shot.  Recent neurology in [place] deceased earlier this yr.  Pt requests new neuro.     Wt Readings from Last 3 Encounters:  07/25/22 217 lb (98.4 kg)  07/23/22 213 lb (96.6 kg)  07/03/22 220 lb (99.8 kg)   BP Readings from Last 3 Encounters:  07/25/22 118/62  07/23/22 112/77  07/03/22 109/76         Past Medical History:  Diagnosis Date   ANXIETY 03/27/2010   BACK PAIN, CHRONIC 03/27/2010   Classic migraine with aura 05/30/2015   COMMON MIGRAINE 03/27/2010   [place] DISEASE, CERVICAL 03/27/2010   FATIGUE 03/27/2010   GERD 03/27/2010   Glaucoma    bilateral   HYPERLIPIDEMIA 03/27/2010   Multiple sclerosis ([place]) 10/23/2010   unconfirmed, but suspected   Myocardial infarction [facility])    "old"   Obesity    Osteoporosis 09/24/2012   Palpitations    Unspecified vitamin D deficiency 10/23/2010   Past Surgical History:  Procedure Laterality Date   ABDOMINAL HYSTERECTOMY     LEFT HEART CATHETERIZATION WITH CORONARY ANGIOGRAM N/A 03/31/2014   Procedure: LEFT HEART CATHETERIZATION WITH CORONARY ANGIOGRAM;  Surgeon: Pixie Casino, MD;  Location: [facility] CATH LAB;  Service: Cardiovascular;  Laterality: N/A;   Neg stress test  2007   s/p eye surgury     s/p knee surgury      reports that she has never smoked. She has never used smokeless tobacco. She reports that she does not drink alcohol and does not use drugs. family history includes Arthritis in an other family member; Cancer in her mother and another family member; Colon polyps in her father and mother; Dementia in an other  family member; Diabetes in her father and another family member; Heart disease in her father and another family member; Hyperlipidemia in her father and mother; Hypertension in her father; Kidney disease in her father; Lupus in her cousin and maternal aunt; Thyroid disease in her father. Allergies  Allergen Reactions   Topamax Other (See Comments)    dizziness   Current Outpatient Medications on File Prior to Visit  Medication Sig Dispense Refill   alendronate (FOSAMAX) 70 MG tablet Take 1 tablet (70 mg total) by mouth every 7 (seven) days. Take with a full glass of water on an empty stomach. 13 tablet 3   aspirin 81 MG EC tablet Take 1 tablet (81 mg total) by mouth daily. Swallow whole. 30 tablet 12   brimonidine (ALPHAGAN P) 0.1 % SOLN Place 1 drop into both eyes 2 (two) times daily.     Efinaconazole 10 % SOLN Apply 1 drop topically daily. 4 mL 11   ezetimibe-simvastatin (VYTORIN) 10-40 MG tablet TAKE 1 TABLET BY MOUTH DAILY. 30 tablet 10   timolol (TIMOPTIC) 0.5 % ophthalmic solution 1 drop 2 (two) times daily.     Vitamin D, Ergocalciferol, (DRISDOL) 1.25 MG (50000 UNIT) CAPS capsule Take 1 capsule (50,000 Units total) by mouth every 7 (seven) days. 4 capsule 0   No  current facility-administered medications on file prior to visit.        ROS:  All others reviewed and negative.  Objective        PE:  BP 118/62 (BP Location: Left Arm, Patient Position: Sitting, Cuff Size: Large)   Pulse 62   Temp 98.5 F (36.9 C) (Oral)   Ht '5\' 4"'$  (1.626 m)   Wt 217 lb (98.4 kg)   SpO2 97%   BMI 37.25 kg/m                 Constitutional: Pt appears in NAD               HENT: Head: NCAT.                Right Ear: External ear normal.                 Left Ear: External ear normal.                Eyes: . Pupils are equal, round, and reactive to light. Conjunctivae and EOM are normal               Nose: without d/c or deformity               Neck: Neck supple. Gross normal ROM                Cardiovascular: Normal rate and regular rhythm.                 Pulmonary/Chest: Effort normal and breath sounds without rales or wheezing.                Abd:  Soft, NT, ND, + BS, no organomegaly               Neurological: Pt is alert. At baseline orientation, motor grossly intact               Skin: Skin is warm. No rashes, no other new lesions, LE edema - none               Psychiatric: Pt behavior is normal without agitation   Micro: none  Cardiac tracings I have personally interpreted today:  none  Pertinent Radiological findings (summarize): none   Lab Results  Component Value Date   WBC 5.8 01/23/2022   HGB 13.2 01/23/2022   HCT 39.6 01/23/2022   PLT 266.0 01/23/2022   GLUCOSE 67 (L) 04/22/2022   CHOL 166 04/22/2022   TRIG 115 04/22/2022   HDL 57 04/22/2022   LDLDIRECT 184.9 08/04/2012   LDLCALC 88 04/22/2022   ALT 25 04/22/2022   AST 30 04/22/2022   NA 141 04/22/2022   K 4.4 04/22/2022   CL 103 04/22/2022   CREATININE 0.55 (L) 04/22/2022   BUN 6 04/22/2022   CO2 19 (L) 04/22/2022   TSH 0.919 04/22/2022   INR 0.99 05/24/2015   HGBA1C 5.9 (H) 04/22/2022   Assessment/Plan:  [name] is a 56 y.o. Black or African American [2] female with  has a past medical history of ANXIETY (03/27/2010), BACK PAIN, CHRONIC (03/27/2010), Classic migraine with aura (05/30/2015), COMMON MIGRAINE (03/27/2010), DISC DISEASE, CERVICAL (03/27/2010), FATIGUE (03/27/2010), GERD (03/27/2010), Glaucoma, HYPERLIPIDEMIA (03/27/2010), Multiple sclerosis ([employer]) (10/23/2010), Myocardial infarction ([place]), Obesity, Osteoporosis (09/24/2012), Palpitations, and Unspecified vitamin D deficiency (10/23/2010).  Multiple sclerosis ([place]) Pt states not definite diagnosis, but followed per neurology for several yrs, pt asks for new neuro referral as previous  MD is deceased earlier this yr  Vitamin D deficiency Last vitamin D Lab Results  Component Value Date   VD25OH 36.3 04/22/2022   Low, reminded  to start oral replacement   Hyperglycemia Lab Results  Component Value Date   HGBA1C 5.9 (H) 04/22/2022   Stable, pt to continue current medical treatment  - diet, wt control, excercise   Familial hypercholesterolemia Lab Results  Component Value Date   LDLCALC 88 04/22/2022   Uncontrolled, pt has known CAD by CT, goal ldl < 70, pt to continue current statin vytorin 10-40 mg for now, but recheck lipids today  Followup: Return in about 6 months (around 01/23/2023).  Cathlean Cower, MD 07/25/2022 1:07 PM [facility]

## 2022-07-25 NOTE — Assessment & Plan Note (Signed)
Pt states not definite diagnosis, but followed per neurology for several yrs, pt asks for new neuro referral as previous MD is deceased earlier this yr

## 2022-07-25 NOTE — Assessment & Plan Note (Signed)
Lab Results  Component Value Date   LDLCALC 88 04/22/2022   Uncontrolled, pt has known CAD by CT, goal ldl < 70, pt to continue current statin vytorin 10-40 mg for now, but recheck lipids today

## 2022-07-25 NOTE — Patient Instructions (Addendum)
You had the flu shot today  You will be contacted regarding the referral for: Dr Tomi Likens [facility] if taking new patients  Please continue all other medications as before, and refills have been done if requested.  Please have the pharmacy call with any other refills you may need.  Please continue your efforts at being more active, low cholesterol diet, and weight control..  Please keep your appointments with your specialists as you may have planned  No lab work needed today  Please make an Appointment to return in 6 months, or sooner if needed, also with Lab Appointment for testing done 3-5 days before at the [place] (so this is for TWO appointments - please see the scheduling desk as you leave)

## 2022-07-25 NOTE — Assessment & Plan Note (Signed)
Last vitamin D Lab Results  Component Value Date   VD25OH 36.3 04/22/2022   Low, reminded to start oral replacement

## 2022-07-30 ENCOUNTER — Ambulatory Visit (INDEPENDENT_AMBULATORY_CARE_PROVIDER_SITE_OTHER): Payer: PRIVATE HEALTH INSURANCE | Admitting: Family Medicine

## 2022-08-04 NOTE — Progress Notes (Signed)
Chief Complaint:   OBESITY [name] is here to discuss her progress with her obesity treatment plan along with follow-up of her obesity related diagnoses. [name] is on the Category 1 Plan 4-6 oz of protein at lunch and 6 oz of protein at dinner and states she is following her eating plan approximately 80% of the time. [name] states she is walking 30 minutes 3 times per week.  Today's visit was #: 7 Starting weight: 242 lbs Starting date: 04/22/2022 Today's weight: 213 lbs Today's date: 07/23/2022 Total lbs lost to date: 29 lbs Total lbs lost since last in-office visit: 7 lbs  Interim History: She had decreased intake of her snack calories.  She stuck to plan more closely.  She measured her proteins.  She denies hunger and cravings.  She has lost 7.2 lbs in fat mass and gained muscle mass.    Subjective:   1. Vitamin D deficiency Vitamin D level 36.3, three months ago.    2. Prediabetes Doing excellent with following plan.  No hunger, or carb cravings.  Fasting insulin 8.7, three months ago and A1c 5.9, three months ago.   Assessment/Plan:   1. Vitamin D deficiency Refill -  Vitamin D, Ergocalciferol, (DRISDOL) 1.25 MG (50000 UNIT) CAPS capsule; Take 1 capsule (50,000 Units total) by mouth every 7 (seven) days.  Dispense: 4 capsule; Refill: 0   2. Prediabetes No need for medications. Continue prudent nutritional plan and weight loss. A1c is decreasing as expected.   3. Obesity with current BMI 36.7 Holiday eating strategies discussed with patient and handouts given.   [name] is currently in the action stage of change. As such, her goal is to continue with weight loss efforts. She has agreed to the Category 1 Plan 4-6 oz of protein at lunch and 6 oz of protein at dinner   Exercise goals:  As is, and increase as tolerated.  Behavioral modification strategies: increasing lean protein intake and decreasing simple carbohydrates.  [name] has agreed to follow-up with our clinic  in 4 weeks. She was informed of the importance of frequent follow-up visits to maximize her success with intensive lifestyle modifications for her multiple health conditions.   Objective:   Blood pressure 112/77, pulse 76, temperature 98.4 F (36.9 C), height '5\' 4"'$  (1.626 m), weight 213 lb (96.6 kg), SpO2 100 %. Body mass index is 36.56 kg/m.  General: Cooperative, alert, well developed, in no acute distress. HEENT: Conjunctivae and lids unremarkable. Cardiovascular: Regular rhythm.  Lungs: Normal work of breathing. Neurologic: No focal deficits.   Lab Results  Component Value Date   CREATININE 0.55 (L) 04/22/2022   BUN 6 04/22/2022   NA 141 04/22/2022   K 4.4 04/22/2022   CL 103 04/22/2022   CO2 19 (L) 04/22/2022   Lab Results  Component Value Date   ALT 25 04/22/2022   AST 30 04/22/2022   ALKPHOS 89 04/22/2022   BILITOT 0.3 04/22/2022   Lab Results  Component Value Date   HGBA1C 5.9 (H) 04/22/2022   HGBA1C 6.0 01/23/2022   HGBA1C 5.8 07/26/2021   HGBA1C 5.8 01/25/2021   HGBA1C 5.8 07/01/2019   Lab Results  Component Value Date   INSULIN 8.7 04/22/2022   Lab Results  Component Value Date   TSH 0.919 04/22/2022   Lab Results  Component Value Date   CHOL 166 04/22/2022   HDL 57 04/22/2022   LDLCALC 88 04/22/2022   LDLDIRECT 184.9 08/04/2012   TRIG 115 04/22/2022  CHOLHDL 3 01/23/2022   Lab Results  Component Value Date   VD25OH 36.3 04/22/2022   VD25OH 44.00 01/23/2022   VD25OH 36.82 10/25/2021   Lab Results  Component Value Date   WBC 5.8 01/23/2022   HGB 13.2 01/23/2022   HCT 39.6 01/23/2022   MCV 92.1 01/23/2022   PLT 266.0 01/23/2022   Lab Results  Component Value Date   IRON 69 07/01/2019   Attestation Statements:   Reviewed by clinician on day of visit: allergies, medications, problem list, medical history, surgical history, family history, social history, and previous encounter notes.  I, Davy Pique, RMA, am acting as  Location manager for [employer], DO.   I have reviewed the above documentation for accuracy and completeness, and I agree with the above. Marjory Sneddon, D.O.  The [place] was signed into law in 2016 which includes the topic of electronic health records.  This provides immediate access to information in MyChart.  This includes consultation notes, operative notes, office notes, lab results and pathology reports.  If you have any questions about what you read please let us know at your next visit so we can discuss your concerns and take corrective action if need be.  We are right here with you.

## 2022-08-11 ENCOUNTER — Ambulatory Visit (AMBULATORY_SURGERY_CENTER): Payer: PRIVATE HEALTH INSURANCE

## 2022-08-11 VITALS — Ht 64.0 in | Wt 223.0 lb

## 2022-08-11 DIAGNOSIS — Z86010 Personal history of colonic polyps: Secondary | ICD-10-CM

## 2022-08-11 MED ORDER — NA SULFATE-K SULFATE-MG SULF 17.5-3.13-1.6 GM/177ML PO SOLN: 1.0000 | Freq: Once | ORAL | 0 refills | Status: AC

## 2022-08-11 MED ORDER — VITAMIN D (ERGOCALCIFEROL) 1.25 MG (50000 UNIT) PO CAPS: 50000.0000 [IU] | ORAL_CAPSULE | ORAL | 0 refills | Status: DC

## 2022-08-11 NOTE — Progress Notes (Signed)
No egg or soy allergy known to patient;  No issues known to pt with past sedation with any surgeries or procedures; Patient denies ever being told they had issues or difficulty with intubation;  No FH of Malignant Hyperthermia; Pt is not on diet pills; Pt is not on home 02;  Pt is not on blood thinners;  Pt reports issues with constipation- patient reports she eats lots of veggies and fruit and consumes lots of water- she also eats fiber or takes fiber daily to assist with constipation relief; advised patient she can also take OTC PRN medications like stool softeners or laxatives to assist with constipation;  No A fib or A flutter; Have any cardiac testing pending--NO Pt instructed to use Single[employer] or GoodRx for a price reduction on prep   Insurance verified during [place] appt=Generic Commercial  Patient's chart reviewed by Osvaldo Angst CNRA prior to previsit and patient appropriate for the [place].  Previsit completed and red dot placed by patient's name on their procedure day (on provider's schedule).    GoodRx coupon given to patient durig PV appt;

## 2022-08-14 ENCOUNTER — Ambulatory Visit (INDEPENDENT_AMBULATORY_CARE_PROVIDER_SITE_OTHER): Payer: PRIVATE HEALTH INSURANCE | Admitting: Family Medicine

## 2022-08-27 ENCOUNTER — Ambulatory Visit (INDEPENDENT_AMBULATORY_CARE_PROVIDER_SITE_OTHER): Payer: PRIVATE HEALTH INSURANCE | Admitting: Family Medicine

## 2022-08-28 ENCOUNTER — Ambulatory Visit (INDEPENDENT_AMBULATORY_CARE_PROVIDER_SITE_OTHER): Payer: PRIVATE HEALTH INSURANCE | Admitting: Family Medicine

## 2022-09-10 ENCOUNTER — Ambulatory Visit (AMBULATORY_SURGERY_CENTER): Payer: PRIVATE HEALTH INSURANCE | Admitting: Gastroenterology

## 2022-09-10 ENCOUNTER — Encounter: Payer: Self-pay | Admitting: Gastroenterology

## 2022-09-10 VITALS — BP 120/73 | HR 67 | Temp 97.1°F | Resp 13 | Ht 64.0 in | Wt 223.0 lb

## 2022-09-10 DIAGNOSIS — D123 Benign neoplasm of transverse colon: Secondary | ICD-10-CM

## 2022-09-10 DIAGNOSIS — F419 Anxiety disorder, unspecified: Secondary | ICD-10-CM | POA: Diagnosis not present

## 2022-09-10 DIAGNOSIS — D125 Benign neoplasm of sigmoid colon: Secondary | ICD-10-CM | POA: Diagnosis not present

## 2022-09-10 DIAGNOSIS — Z86010 Personal history of colonic polyps: Secondary | ICD-10-CM | POA: Diagnosis not present

## 2022-09-10 DIAGNOSIS — K635 Polyp of colon: Secondary | ICD-10-CM | POA: Diagnosis not present

## 2022-09-10 DIAGNOSIS — Z09 Encounter for follow-up examination after completed treatment for conditions other than malignant neoplasm: Secondary | ICD-10-CM | POA: Diagnosis not present

## 2022-09-10 DIAGNOSIS — E669 Obesity, unspecified: Secondary | ICD-10-CM | POA: Diagnosis not present

## 2022-09-10 MED ORDER — SODIUM CHLORIDE 0.9 % IV SOLN: 500.0000 mL | Freq: Once | INTRAVENOUS | Status: DC

## 2022-09-10 NOTE — Op Note (Signed)
[place] Patient Name: [name] Procedure Date: 09/10/2022 9:00 AM MRN: [id number] Endoscopist: Ladene Artist , MD, [id number] Age: 57 Referring MD:  Date of Birth: [birth year] Gender: Female Account #: [id number] Procedure:                Colonoscopy Indications:              Surveillance: Personal history of sessile serrated                            and adenomatous polyps on last colonoscopy 3 years                            ago Medicines:                Monitored Anesthesia Care Procedure:                Pre-Anesthesia Assessment:                           - Prior to the procedure, a History and Physical                            was performed, and patient medications and                            allergies were reviewed. The patient's tolerance of                            previous anesthesia was also reviewed. The risks                            and benefits of the procedure and the sedation                            options and risks were discussed with the patient.                            All questions were answered, and informed consent                            was obtained. Prior Anticoagulants: The patient has                            taken no anticoagulant or antiplatelet agents. ASA                            Grade Assessment: II - A patient with mild systemic                            disease. After reviewing the risks and benefits,                            the patient was deemed in satisfactory condition to  undergo the procedure.                           After obtaining informed consent, the colonoscope                            was passed under direct vision. Throughout the                            procedure, the patient's blood pressure, pulse, and                            oxygen saturations were monitored continuously. The                            CF [id number] #[id number] was introduced through the anus                             and advanced to the the cecum, identified by                            appendiceal orifice and ileocecal valve. The                            ileocecal valve, appendiceal orifice, and rectum                            were photographed. The quality of the bowel                            preparation was excellent. The colonoscopy was                            performed without difficulty. The patient tolerated                            the procedure well. Scope In: 9:09:34 AM Scope Out: 9:24:14 AM Scope Withdrawal Time: 0 hours 10 minutes 14 seconds  Total Procedure Duration: 0 hours 14 minutes 40 seconds  Findings:                 The perianal and digital rectal examinations were                            normal.                           Two sessile polyps were found in the sigmoid colon                            and hepatic flexure. The polyps were 6 to 8 mm in                            size. These polyps were removed with a cold snare.  Resection and retrieval were complete.                           The exam was otherwise without abnormality on                            direct and retroflexion views. Complications:            No immediate complications. Estimated blood loss:                            None. Estimated Blood Loss:     Estimated blood loss: none. Impression:               - Two 6 to 8 mm polyps in the sigmoid colon and at                            the hepatic flexure, removed with a cold snare.                            Resected and retrieved.                           - The examination was otherwise normal on direct                            and retroflexion views. Recommendation:           - Repeat colonoscopy after studies are complete for                            surveillance based on pathology results.                           - Patient has a contact number available for                             emergencies. The signs and symptoms of potential                            delayed complications were discussed with the                            patient. Return to normal activities tomorrow.                            Written discharge instructions were provided to the                            patient.                           - Resume previous diet.                           - Continue present medications.                           -  Await pathology results. Ladene Artist, MD 09/10/2022 9:26:58 AM This report has been signed electronically.

## 2022-09-10 NOTE — Progress Notes (Signed)
History & Physical  Primary Care Physician:  Biagio Borg, MD Primary Gastroenterologist: Lucio Edward, MD  CHIEF COMPLAINT: Personal history of colon polyps   HPI: [name] is a 57 y.o. female with a personal history of sessile serrated and adenomatous colon polyps for surveillance colonoscopy.   Past Medical History:  Diagnosis Date   ANXIETY 03/27/2010   BACK PAIN, CHRONIC 03/27/2010   Classic migraine with aura 05/30/2015   COMMON MIGRAINE 03/27/2010   [place] DISEASE, CERVICAL 03/27/2010   FATIGUE 03/27/2010   GERD 03/27/2010   Glaucoma    bilateral- on meds (eye drops)   HYPERLIPIDEMIA 03/27/2010   on meds   Multiple sclerosis ([place]) 10/23/2010   unconfirmed, but suspected   Myocardial infarction ([place])    Obesity    Osteoporosis 09/24/2012   on meds   Palpitations    Seasonal allergies    Unspecified vitamin D deficiency 10/23/2010    Past Surgical History:  Procedure Laterality Date   ABDOMINAL HYSTERECTOMY     partial-uterus removed   LEFT HEART CATHETERIZATION WITH CORONARY ANGIOGRAM N/A 03/31/2014   Procedure: LEFT HEART CATHETERIZATION WITH CORONARY ANGIOGRAM;  Surgeon: Pixie Casino, MD;  Location: [facility] CATH LAB;  Service: Cardiovascular;  Laterality: N/A;   Neg stress test  09/08/2005   s/p eye surgury     s/p knee surgury     WISDOM TOOTH EXTRACTION      Prior to Admission medications   Medication Sig Start Date End Date Taking? Authorizing Provider  alendronate (FOSAMAX) 70 MG tablet Take 1 tablet (70 mg total) by mouth every 7 (seven) days. Take with a full glass of water on an empty stomach. 10/25/21  Yes Shamleffer, Melanie Crazier, MD  aspirin 81 MG EC tablet Take 1 tablet (81 mg total) by mouth daily. Swallow whole. 01/07/17  Yes Biagio Borg, MD  brimonidine (ALPHAGAN) 0.2 % ophthalmic solution Place 1 drop into both eyes 3 (three) times daily. 07/28/22  Yes [provider]  Efinaconazole 10 % SOLN Apply 1 drop topically daily.  05/23/22  Yes Trula Slade, DPM  ezetimibe-simvastatin (VYTORIN) 10-40 MG tablet TAKE 1 TABLET BY MOUTH DAILY. 06/06/22  Yes Lendon Colonel, NP  timolol (TIMOPTIC) 0.5 % ophthalmic solution 1 drop 2 (two) times daily. 06/05/21  Yes [provider]  Vitamin D, Ergocalciferol, (DRISDOL) 1.25 MG (50000 UNIT) CAPS capsule Take 1 capsule (50,000 Units total) by mouth every 7 (seven) days. 08/11/22  Yes Opalski, Neoma Laming, DO    Current Outpatient Medications  Medication Sig Dispense Refill   alendronate (FOSAMAX) 70 MG tablet Take 1 tablet (70 mg total) by mouth every 7 (seven) days. Take with a full glass of water on an empty stomach. 13 tablet 3   aspirin 81 MG EC tablet Take 1 tablet (81 mg total) by mouth daily. Swallow whole. 30 tablet 12   brimonidine (ALPHAGAN) 0.2 % ophthalmic solution Place 1 drop into both eyes 3 (three) times daily.     Efinaconazole 10 % SOLN Apply 1 drop topically daily. 4 mL 11   ezetimibe-simvastatin (VYTORIN) 10-40 MG tablet TAKE 1 TABLET BY MOUTH DAILY. 30 tablet 10   timolol (TIMOPTIC) 0.5 % ophthalmic solution 1 drop 2 (two) times daily.     Vitamin D, Ergocalciferol, (DRISDOL) 1.25 MG (50000 UNIT) CAPS capsule Take 1 capsule (50,000 Units total) by mouth every 7 (seven) days. 4 capsule 0   Current Facility-Administered Medications  Medication Dose Route Frequency Provider Last Rate  Last Admin   0.9 %  sodium chloride infusion  500 mL Intravenous Once Ladene Artist, MD        Allergies as of 09/10/2022 - Review Complete 09/10/2022  Allergen Reaction Noted   Topamax Other (See Comments) 12/10/2010    Family History  Problem Relation Age of Onset   Cancer Mother        breast   Hyperlipidemia Mother    Colon polyps Mother    Heart disease Father    Diabetes Father    Hyperlipidemia Father    Hypertension Father    Colon polyps Father    Kidney disease Father    Thyroid disease Father    Lupus Maternal Aunt    Lupus Cousin         mother and father's side   Diabetes Other    Arthritis Other    Dementia Other    Cancer Other        2 uncles with lung cancer   Heart disease Other        2 uncles with heart disease   Colon cancer Neg Hx    Esophageal cancer Neg Hx    Stomach cancer Neg Hx    Rectal cancer Neg Hx     Social History   Socioeconomic History   Marital status: Married    Spouse name: [name]   Number of children: 1   Years of education: College   Highest education level: Not on file  Occupational History   Occupation: Disabled    Comment:  due to "MS" since 2005   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   Patient is right handed.   Patient does not drink caffeine.   Social Determinants of Health   Financial Resource Strain: Low Risk  (06/10/2022)   Overall Financial Resource Strain (CARDIA)    Difficulty of Paying Living Expenses: Not very hard  Food Insecurity: No Food Insecurity (06/10/2022)   Hunger Vital Sign    Worried About Running Out of Food in the Last Year: Never true    Ran Out of Food in the Last Year: Never true  Transportation Needs: No Transportation Needs (06/10/2022)   PRAPARE - Hydrologist (Medical): No    Lack of Transportation (Non-Medical): No  Physical Activity: Insufficiently Active (06/10/2022)   Exercise Vital Sign    Days of Exercise per Week: 2 days    Minutes of Exercise per Session: 30 min  Stress: No Stress Concern Present (06/10/2022)   [place]    Feeling of Stress : Only a little  Social  Connections: Socially Integrated (06/10/2022)   Social Connection and Isolation Panel [NHANES]    Frequency of Communication with Friends and Family: More than three times a week    Frequency of Social Gatherings with Friends and Family: More than three times a week    Attends Religious Services: More than 4 times per year    Active Member of Clubs or  Organizations: Yes    Attends Music therapist: More than 4 times per year    Marital Status: Married  Human resources officer Violence: Not At Risk (06/10/2022)   Humiliation, Afraid, Rape, and Kick questionnaire    Fear of Current or Ex-Partner: No    Emotionally Abused: No    Physically Abused: No    Sexually Abused: No    Review of Systems:  All systems reviewed were negative except where noted in HPI.   Physical Exam: General:  Alert, well-developed, in NAD Head:  Normocephalic and atraumatic. Eyes:  Sclera clear, no icterus.   Conjunctiva pink. Ears:  Normal auditory acuity. Mouth:  No deformity or lesions.  Neck:  Supple; no masses . Lungs:  Clear throughout to auscultation.   No wheezes, crackles, or rhonchi. No acute distress. Heart:  Regular rate and rhythm; no murmurs. Abdomen:  Soft, nondistended, nontender. No masses, hepatomegaly. No obvious masses.  Normal bowel .    Rectal:  Deferred   Msk:  Symmetrical without gross deformities.. Pulses:  Normal pulses noted. Extremities:  Without edema. Neurologic:  Alert and  oriented x4;  grossly normal neurologically. Skin:  Intact without significant lesions or rashes. Psych:  Alert and cooperative. Normal mood and affect.  Impression / Plan:   Personal history of sessile serrated and adenomatous colon polyps for surveillance colonoscopy.  Pricilla Riffle. Fuller Plan  09/10/2022, 9:00 AM See Shea Evans, [facility], to contact our on call provider

## 2022-09-10 NOTE — Progress Notes (Signed)
Called to room to assist during endoscopic procedure.  Patient ID and intended procedure confirmed with present staff. Received instructions for my participation in the procedure from the performing physician.  

## 2022-09-10 NOTE — Progress Notes (Signed)
Report to PACU, RN, vss, BBS= Clear.  

## 2022-09-10 NOTE — Patient Instructions (Signed)
YOU HAD AN ENDOSCOPIC PROCEDURE TODAY AT [facility]:   Refer to the procedure report that was given to you for any specific questions about what was found during the examination.  If the procedure report does not answer your questions, please call your gastroenterologist to clarify.  If you requested that your care partner not be given the details of your procedure findings, then the procedure report has been included in a sealed envelope for you to review at your convenience later.  **Handout given on polyps**   YOU SHOULD EXPECT: Some feelings of bloating in the abdomen. Passage of more gas than usual.  Walking can help get rid of the air that was put into your GI tract during the procedure and reduce the bloating. If you had a lower endoscopy (such as a colonoscopy or flexible sigmoidoscopy) you may notice spotting of blood in your stool or on the toilet paper. If you underwent a bowel prep for your procedure, you may not have a normal bowel movement for a few days.  Please Note:  You might notice some irritation and congestion in your nose or some drainage.  This is from the oxygen used during your procedure.  There is no need for concern and it should clear up in a day or so.  SYMPTOMS TO REPORT IMMEDIATELY:  Following lower endoscopy (colonoscopy or flexible sigmoidoscopy):  Excessive amounts of blood in the stool  Significant tenderness or worsening of abdominal pains  Swelling of the abdomen that is new, acute  Fever of 100F or higher   For urgent or emergent issues, a gastroenterologist can be reached at any hour by calling [phone]. Do not use MyChart messaging for urgent concerns.    DIET:  We do recommend a small meal at first, but then you may proceed to your regular diet.  Drink plenty of fluids but you should avoid alcoholic beverages for 24 hours.  ACTIVITY:  You should plan to take it easy for the rest of today and you should NOT DRIVE or use heavy  machinery until tomorrow (because of the sedation medicines used during the test).    FOLLOW UP: Our staff will call the number listed on your records the next business day following your procedure.  We will call around 7:15- 8:00 am to check on you and address any questions or concerns that you may have regarding the information given to you following your procedure. If we do not reach you, we will leave a message.     If any biopsies were taken you will be contacted by phone or by letter within the next 1-3 weeks.  Please call us at [phone] if you have not heard about the biopsies in 3 weeks.    SIGNATURES/CONFIDENTIALITY: You and/or your care partner have signed paperwork which will be entered into your electronic medical record.  These signatures attest to the fact that that the information above on your After Visit Summary has been reviewed and is understood.  Full responsibility of the confidentiality of this discharge information lies with you and/or your care-partner.

## 2022-09-10 NOTE — Progress Notes (Signed)
Pt's states no medical or surgical changes since previsit or office visit. 

## 2022-09-11 ENCOUNTER — Telehealth: Payer: Self-pay

## 2022-09-11 NOTE — Telephone Encounter (Signed)
  Follow up Call-     09/10/2022    8:29 AM  Call back number  Post procedure Call Back phone  # [phone]  Permission to leave phone message Yes     Patient questions:  Do you have a fever, pain , or abdominal swelling? No. Pain Score  0 *  Have you tolerated food without any problems? Yes.    Have you been able to return to your normal activities? Yes.    Do you have any questions about your discharge instructions: Diet   No. Medications  No. Follow up visit  No.  Do you have questions or concerns about your Care? No.  Actions: * If pain score is 4 or above: No action needed, pain <4.

## 2022-09-16 ENCOUNTER — Encounter (INDEPENDENT_AMBULATORY_CARE_PROVIDER_SITE_OTHER): Payer: Self-pay | Admitting: Family Medicine

## 2022-09-16 ENCOUNTER — Ambulatory Visit (INDEPENDENT_AMBULATORY_CARE_PROVIDER_SITE_OTHER): Payer: PRIVATE HEALTH INSURANCE | Admitting: Family Medicine

## 2022-09-16 VITALS — BP 96/65 | HR 74 | Temp 98.0°F | Ht 64.0 in | Wt 217.0 lb

## 2022-09-16 DIAGNOSIS — R7303 Prediabetes: Secondary | ICD-10-CM

## 2022-09-16 DIAGNOSIS — M25511 Pain in right shoulder: Secondary | ICD-10-CM | POA: Diagnosis not present

## 2022-09-16 DIAGNOSIS — E559 Vitamin D deficiency, unspecified: Secondary | ICD-10-CM

## 2022-09-16 DIAGNOSIS — E669 Obesity, unspecified: Secondary | ICD-10-CM | POA: Diagnosis not present

## 2022-09-16 DIAGNOSIS — Z6837 Body mass index (BMI) 37.0-37.9, adult: Secondary | ICD-10-CM | POA: Diagnosis not present

## 2022-09-16 DIAGNOSIS — Z6836 Body mass index (BMI) 36.0-36.9, adult: Secondary | ICD-10-CM

## 2022-09-16 MED ORDER — VITAMIN D (ERGOCALCIFEROL) 1.25 MG (50000 UNIT) PO CAPS: 50000.0000 [IU] | ORAL_CAPSULE | ORAL | 0 refills | Status: DC

## 2022-09-25 ENCOUNTER — Encounter: Payer: Self-pay | Admitting: Gastroenterology

## 2022-10-06 NOTE — Progress Notes (Unsigned)
Chief Complaint:   OBESITY [name] is here to discuss her progress with her obesity treatment plan along with follow-up of her obesity related diagnoses. [name] is on the Category 1 Plan and states she is following her eating plan approximately 0% of the time. [name] states she has not been exercising.  Today's visit was #: 8 Starting weight: 242 lbs Starting date: 04/22/22 Today's weight: 217 lbs Today's date: 09/16/22 Total lbs lost to date: 25 Total lbs lost since last in-office visit: +4  Interim History: Got off track in December.  Went through anniversary grief since the loss of her dad.  Dealing with right shoulder pain.  Had a colonoscopy January 3.  Restarted meal plan January 4.  On plan, denies hunger.  Subjective:   1. Right shoulder pain, unspecified chronicity Status post steroid injection-seen by Ortho. Needs surgery done. Upper extremity exercises are limited.  2. Vitamin D deficiency Taking prescription vitamin D 50,000 IU weekly. Energy levels are improving. Last vitamin D 04/22/22-36.3  3. Pre-diabetes A1c 5.9 on 04/22/2022. Has never taken metformin. Actively working on a low sugar diet.  Assessment/Plan:   1. Right shoulder pain, unspecified chronicity Follow-up with Ortho.  Walk for exercise.  2. Vitamin D deficiency Refilled: - Vitamin D, Ergocalciferol, (DRISDOL) 1.25 MG (50000 UNIT) CAPS capsule; Take 1 capsule (50,000 Units total) by mouth every 7 (seven) days.  Dispense: 4 capsule; Refill: 0 Recheck vitamin D level next visit.  3. Pre-diabetes Repeat A1c next visit. Resume prescribed diet.  4. Obesity,current BMI 37.4 1.  Resume category meal plan. 2.  Use treadmill at home 30 minutes 3-4 times per week. 3.  Check labs next visit.  [name] is currently in the action stage of change. As such, her goal is to continue with weight loss efforts. She has agreed to the Category 1 Plan.   Exercise goals: Use treadmill at home 30 minutes 3-4  times per week.  Behavioral modification strategies: increasing lean protein intake, increasing water intake, decreasing eating out, no skipping meals, meal planning and cooking strategies, keeping healthy foods in the home, and planning for success.  [name] has agreed to follow-up with our clinic in 3-4 weeks. She was informed of the importance of frequent follow-up visits to maximize her success with intensive lifestyle modifications for her multiple health conditions.    Objective:   Blood pressure 96/65, pulse 74, temperature 98 F (36.7 C), height '5\' 4"'$  (1.626 m), weight 217 lb (98.4 kg), SpO2 100 %. Body mass index is 37.25 kg/m.  General: Cooperative, alert, well developed, in no acute distress. HEENT: Conjunctivae and lids unremarkable. Cardiovascular: Regular rhythm.  Lungs: Normal work of breathing. Neurologic: No focal deficits.   Lab Results  Component Value Date   CREATININE 0.55 (L) 04/22/2022   BUN 6 04/22/2022   NA 141 04/22/2022   K 4.4 04/22/2022   CL 103 04/22/2022   CO2 19 (L) 04/22/2022   Lab Results  Component Value Date   ALT 25 04/22/2022   AST 30 04/22/2022   ALKPHOS 89 04/22/2022   BILITOT 0.3 04/22/2022   Lab Results  Component Value Date   HGBA1C 5.9 (H) 04/22/2022   HGBA1C 6.0 01/23/2022   HGBA1C 5.8 07/26/2021   HGBA1C 5.8 01/25/2021   HGBA1C 5.8 07/01/2019   Lab Results  Component Value Date   INSULIN 8.7 04/22/2022   Lab Results  Component Value Date   TSH 0.919 04/22/2022   Lab Results  Component Value Date  CHOL 166 04/22/2022   HDL 57 04/22/2022   LDLCALC 88 04/22/2022   LDLDIRECT 184.9 08/04/2012   TRIG 115 04/22/2022   CHOLHDL 3 01/23/2022   Lab Results  Component Value Date   VD25OH 36.3 04/22/2022   VD25OH 44.00 01/23/2022   VD25OH 36.82 10/25/2021   Lab Results  Component Value Date   WBC 5.8 01/23/2022   HGB 13.2 01/23/2022   HCT 39.6 01/23/2022   MCV 92.1 01/23/2022   PLT 266.0 01/23/2022   Lab  Results  Component Value Date   IRON 69 07/01/2019    Attestation Statements:   Reviewed by clinician on day of visit: allergies, medications, problem list, medical history, surgical history, family history, social history, and previous encounter notes.  I, Georgianne Fick, FNP, am acting as transcriptionist for Dr. Loyal Gambler.  I have reviewed the above documentation for accuracy and completeness, and I agree with the above. Dell Ponto, DO

## 2022-10-07 ENCOUNTER — Encounter: Payer: Self-pay | Admitting: Internal Medicine

## 2022-10-07 ENCOUNTER — Ambulatory Visit (INDEPENDENT_AMBULATORY_CARE_PROVIDER_SITE_OTHER): Payer: PRIVATE HEALTH INSURANCE | Admitting: Internal Medicine

## 2022-10-07 VITALS — BP 118/72 | HR 70 | Temp 97.9°F | Ht 64.0 in | Wt 229.0 lb

## 2022-10-07 DIAGNOSIS — M25511 Pain in right shoulder: Secondary | ICD-10-CM | POA: Diagnosis not present

## 2022-10-07 DIAGNOSIS — D485 Neoplasm of uncertain behavior of skin: Secondary | ICD-10-CM

## 2022-10-07 DIAGNOSIS — G8929 Other chronic pain: Secondary | ICD-10-CM | POA: Diagnosis not present

## 2022-10-07 MED ORDER — TRAMADOL HCL 50 MG PO TABS: 50.0000 mg | ORAL_TABLET | Freq: Three times a day (TID) | ORAL | 1 refills | Status: DC | PRN

## 2022-10-07 MED ORDER — NABUMETONE 500 MG PO TABS: 500.0000 mg | ORAL_TABLET | Freq: Two times a day (BID) | ORAL | 1 refills | Status: DC | PRN

## 2022-10-07 NOTE — Progress Notes (Signed)
Subjective:  Patient ID: [name], female    DOB: 03-09-1966  Age: 57 y.o. MRN: [id number]  CC: right shoulder pain (And has a spot her right leg and would like referral to dermatologist noticed the spot 6 months ago , and shoulder pain has bothering her since jan 2022 has gotten worst since then when she tries to move it , it locks up)   HPI [name] presents for severe R shoulder pain - worse C/o skin lesions on RLE  Outpatient Medications Prior to Visit  Medication Sig Dispense Refill   alendronate (FOSAMAX) 70 MG tablet Take 1 tablet (70 mg total) by mouth every 7 (seven) days. Take with a full glass of water on an empty stomach. 13 tablet 3   aspirin 81 MG EC tablet Take 1 tablet (81 mg total) by mouth daily. Swallow whole. 30 tablet 12   brimonidine (ALPHAGAN) 0.2 % ophthalmic solution Place 1 drop into both eyes 3 (three) times daily.     Efinaconazole 10 % SOLN Apply 1 drop topically daily. 4 mL 11   ezetimibe-simvastatin (VYTORIN) 10-40 MG tablet TAKE 1 TABLET BY MOUTH DAILY. 30 tablet 10   timolol (TIMOPTIC) 0.5 % ophthalmic solution 1 drop 2 (two) times daily.     Vitamin D, Ergocalciferol, (DRISDOL) 1.25 MG (50000 UNIT) CAPS capsule Take 1 capsule (50,000 Units total) by mouth every 7 (seven) days. 4 capsule 0   Facility-Administered Medications Prior to Visit  Medication Dose Route Frequency Provider Last Rate Last Admin   0.9 %  sodium chloride infusion  500 mL Intravenous Once Ladene Artist, MD        ROS: Review of Systems  Constitutional:  Negative for activity change, appetite change, chills, fatigue and unexpected weight change.  HENT:  Negative for congestion, mouth sores and sinus pressure.   Eyes:  Negative for visual disturbance.  Respiratory:  Negative for cough and chest tightness.   Gastrointestinal:  Negative for abdominal pain and nausea.  Genitourinary:  Negative for difficulty urinating, frequency and vaginal pain.  Musculoskeletal:   Positive for arthralgias. Negative for back pain and gait problem.  Skin:  Positive for color change. Negative for pallor and rash.  Neurological:  Negative for dizziness, tremors, weakness, numbness and headaches.  Psychiatric/Behavioral:  Negative for confusion and sleep disturbance.     Objective:  BP 118/72 (BP Location: Left Arm, Patient Position: Sitting, Cuff Size: Normal)   Pulse 70   Temp 97.9 F (36.6 C) (Oral)   Ht '5\' 4"'$  (1.626 m)   Wt 229 lb (103.9 kg)   SpO2 100%   BMI 39.31 kg/m   BP Readings from Last 3 Encounters:  10/07/22 118/72  09/16/22 96/65  09/10/22 120/73    Wt Readings from Last 3 Encounters:  10/07/22 229 lb (103.9 kg)  09/16/22 217 lb (98.4 kg)  09/10/22 223 lb (101.2 kg)    Physical Exam Constitutional:      General: She is not in acute distress.    Appearance: She is well-developed. She is obese.  HENT:     Head: Normocephalic.     Right Ear: External ear normal.     Left Ear: External ear normal.     Nose: Nose normal.  Eyes:     General:        Right eye: No discharge.        Left eye: No discharge.     Conjunctiva/sclera: Conjunctivae normal.     Pupils:  Pupils are equal, round, and reactive to light.  Neck:     Thyroid: No thyromegaly.     Vascular: No JVD.     Trachea: No tracheal deviation.  Cardiovascular:     Rate and Rhythm: Normal rate and regular rhythm.     Heart sounds: Normal heart sounds.  Pulmonary:     Effort: No respiratory distress.     Breath sounds: No stridor. No wheezing.  Abdominal:     General: Bowel sounds are normal. There is no distension.     Palpations: Abdomen is soft. There is no mass.     Tenderness: There is no abdominal tenderness. There is no guarding or rebound.  Musculoskeletal:        General: Tenderness present.     Cervical back: Normal range of motion and neck supple. No rigidity.     Right lower leg: No edema.     Left lower leg: No edema.  Lymphadenopathy:     Cervical: No cervical  adenopathy.  Skin:    Findings: No erythema or rash.  Neurological:     Cranial Nerves: No cranial nerve deficit.     Motor: No abnormal muscle tone.     Coordination: Coordination normal.     Deep Tendon Reflexes: Reflexes normal.  Psychiatric:        Behavior: Behavior normal.        Thought Content: Thought content normal.        Judgment: Judgment normal.   R soulde is very tender w/ROM 2 skin lesions on RLE Lab Results  Component Value Date   WBC 5.8 01/23/2022   HGB 13.2 01/23/2022   HCT 39.6 01/23/2022   PLT 266.0 01/23/2022   GLUCOSE 67 (L) 04/22/2022   CHOL 166 04/22/2022   TRIG 115 04/22/2022   HDL 57 04/22/2022   LDLDIRECT 184.9 08/04/2012   LDLCALC 88 04/22/2022   ALT 25 04/22/2022   AST 30 04/22/2022   NA 141 04/22/2022   K 4.4 04/22/2022   CL 103 04/22/2022   CREATININE 0.55 (L) 04/22/2022   BUN 6 04/22/2022   CO2 19 (L) 04/22/2022   TSH 0.919 04/22/2022   INR 0.99 05/24/2015   HGBA1C 5.9 (H) 04/22/2022    DG Bone Density  Result Date: 02/10/2021 Date of study: 02/08/21 Exam: DUAL X-RAY ABSORPTIOMETRY (DXA) FOR BONE MINERAL DENSITY (BMD) Instrument: [employer] Provider: PCP Indication: screening for osteoporosis Comparison: none (please note that it is not possible to compare data from different instruments) Clinical data: Pt is a 57 y.o. female without history of fracture. Results:  Lumbar spine L1-L4 Femoral neck (FN) 33% distal radius T-score -2.4 RFN: -2.4 LFN: -2.7 n/a Change in BMD from previous DXA test (%) down 13% Up 1.5% n/a (*) statistically significant Assessment: Patient has OSTEOPOROSIS according to the [facility] classification for osteoporosis (see below). Fracture risk: high Comments: the technical quality of the study is good  L2 is excluded from the spine study due to degenerative change Evaluation for secondary causes should be considered if clinically indicated. Recommend optimizing calcium (1200 mg/day) and vitamin D (800  IU/day). Treatment is indicated. Followup: Repeat BMD is appropriate after 2 years or after 1-2 years if starting treatment. WHO criteria for diagnosis of osteoporosis in postmenopausal women and in men 60 y/o or older: - normal: T-score -1.0 to + 1.0 - osteopenia/low bone density: T-score between -2.5 and -1.0 - osteoporosis: T-score below -2.5 - severe osteoporosis: T-score below -2.5 with history  of fragility fracture Note: although not part of the WHO classification, the presence of a fragility fracture, regardless of the T-score, should be considered diagnostic of osteoporosis, provided other causes for the fracture have been excluded. Treatment: The National Osteoporosis Foundation recommends that treatment be considered in postmenopausal women and men age 60 or older with: 1. Hip or vertebral (clinical or morphometric) fracture 2. T-score of - 2.5 or lower at the spine or hip 3. 10-year fracture probability by FRAX of at least 20% for a major osteoporotic fracture and 3% for a hip fracture Loura Pardon MD    Assessment & Plan:   Problem List Items Addressed This Visit       Musculoskeletal and Integument   Neoplasm of uncertain behavior of skin    R leg lesions Derm ref      Relevant Orders   Ambulatory referral to Dermatology     Other   Shoulder pain, right - Primary    Worse Chronic, severe Blue-Emu cream was recommended to use 2-3 times a day Relafen 500 mg bid Tramadol 50 mg prn  Potential benefits of a short/long term opioids use as well as potential risks (i.e. addiction risk, apnea etc) and complications (i.e. Somnolence, constipation and others) were explained to the patient and were aknowledged. Sports med ref        Relevant Orders   Ambulatory referral to Sports Medicine      Meds ordered this encounter  Medications   nabumetone (RELAFEN) 500 MG tablet    Sig: Take 1 tablet (500 mg total) by mouth 2 (two) times daily as needed for moderate pain.    Dispense:   60 tablet    Refill:  1   traMADol (ULTRAM) 50 MG tablet    Sig: Take 1 tablet (50 mg total) by mouth every 8 (eight) hours as needed.    Dispense:  20 tablet    Refill:  1      Follow-up: No follow-ups on file.  Walker Kehr, MD

## 2022-10-07 NOTE — Assessment & Plan Note (Signed)
Worse Chronic, severe Blue-Emu cream was recommended to use 2-3 times a day Relafen 500 mg bid Tramadol 50 mg prn  Potential benefits of a short/long term opioids use as well as potential risks (i.e. addiction risk, apnea etc) and complications (i.e. Somnolence, constipation and others) were explained to the patient and were aknowledged. Sports med ref

## 2022-10-07 NOTE — Progress Notes (Signed)
[name] [name] Phone: [phone]   Assessment and Plan:     There are no diagnoses linked to this encounter.  ***   Pertinent previous records reviewed include ***   Follow Up: ***     Subjective:   I, [name], am serving as a Education administrator for [name]  Chief Complaint: right shoulder pain   HPI:   10/08/2022 Patient is a 57 year old female complaining of right shoulder pain. Patient states  Relevant Historical Information: ***  Additional pertinent review of systems negative.   Current Outpatient Medications:    alendronate (FOSAMAX) 70 MG tablet, Take 1 tablet (70 mg total) by mouth every 7 (seven) days. Take with a full glass of water on an empty stomach., Disp: 13 tablet, Rfl: 3   aspirin 81 MG EC tablet, Take 1 tablet (81 mg total) by mouth daily. Swallow whole., Disp: 30 tablet, Rfl: 12   brimonidine (ALPHAGAN) 0.2 % ophthalmic solution, Place 1 drop into both eyes 3 (three) times daily., Disp: , Rfl:    Efinaconazole 10 % SOLN, Apply 1 drop topically daily., Disp: 4 mL, Rfl: 11   ezetimibe-simvastatin (VYTORIN) 10-40 MG tablet, TAKE 1 TABLET BY MOUTH DAILY., Disp: 30 tablet, Rfl: 10   nabumetone (RELAFEN) 500 MG tablet, Take 1 tablet (500 mg total) by mouth 2 (two) times daily as needed for moderate pain., Disp: 60 tablet, Rfl: 1   timolol (TIMOPTIC) 0.5 % ophthalmic solution, 1 drop 2 (two) times daily., Disp: , Rfl:    traMADol (ULTRAM) 50 MG tablet, Take 1 tablet (50 mg total) by mouth every 8 (eight) hours as needed., Disp: 20 tablet, Rfl: 1   Vitamin D, Ergocalciferol, (DRISDOL) 1.25 MG (50000 UNIT) CAPS capsule, Take 1 capsule (50,000 Units total) by mouth every 7 (seven) days., Disp: 4 capsule, Rfl: 0  Current Facility-Administered Medications:    0.9 %  sodium chloride infusion, 500 mL, Intravenous, Once, Tammy Artist, MD   Objective:     There  were no vitals filed for this visit.    There is no height or weight on file to calculate BMI.    Physical Exam:    ***   Electronically signed by:  [name] D.[name] Sports Medicine 12:04 PM 10/07/22

## 2022-10-07 NOTE — Assessment & Plan Note (Signed)
R leg lesions Derm ref

## 2022-10-08 ENCOUNTER — Ambulatory Visit (INDEPENDENT_AMBULATORY_CARE_PROVIDER_SITE_OTHER): Payer: PRIVATE HEALTH INSURANCE

## 2022-10-08 ENCOUNTER — Ambulatory Visit (INDEPENDENT_AMBULATORY_CARE_PROVIDER_SITE_OTHER): Payer: PRIVATE HEALTH INSURANCE | Admitting: Sports Medicine

## 2022-10-08 VITALS — BP 118/72 | HR 84 | Ht 64.0 in | Wt 229.0 lb

## 2022-10-08 DIAGNOSIS — M19011 Primary osteoarthritis, right shoulder: Secondary | ICD-10-CM

## 2022-10-08 DIAGNOSIS — G8929 Other chronic pain: Secondary | ICD-10-CM | POA: Diagnosis not present

## 2022-10-08 DIAGNOSIS — M25511 Pain in right shoulder: Secondary | ICD-10-CM | POA: Diagnosis not present

## 2022-10-08 MED ORDER — MELOXICAM 15 MG PO TABS: 15.0000 mg | ORAL_TABLET | Freq: Every day | ORAL | 0 refills | Status: DC

## 2022-10-08 NOTE — Patient Instructions (Addendum)
Good to see you  MRI referral  Follow up 3 days after MRI to discuss results  - Start meloxicam 15 mg daily x2 weeks.  If still having pain after 2 weeks, complete 3rd-week of meloxicam. May use remaining meloxicam as needed once daily for pain control.  Do not to use additional NSAIDs while taking meloxicam.  May use Tylenol [phone] mg 2 to 3 times a day for breakthrough pain. Shoulder HEP

## 2022-10-09 ENCOUNTER — Ambulatory Visit (INDEPENDENT_AMBULATORY_CARE_PROVIDER_SITE_OTHER): Payer: Medicare HMO | Admitting: Family Medicine

## 2022-10-13 ENCOUNTER — Other Ambulatory Visit: Payer: Self-pay | Admitting: Sports Medicine

## 2022-10-13 DIAGNOSIS — G8929 Other chronic pain: Secondary | ICD-10-CM

## 2022-10-13 DIAGNOSIS — M19011 Primary osteoarthritis, right shoulder: Secondary | ICD-10-CM

## 2022-10-22 ENCOUNTER — Ambulatory Visit (INDEPENDENT_AMBULATORY_CARE_PROVIDER_SITE_OTHER): Payer: Medicare HMO | Admitting: Family Medicine

## 2022-10-27 ENCOUNTER — Ambulatory Visit: Payer: PRIVATE HEALTH INSURANCE | Admitting: Internal Medicine

## 2022-10-27 NOTE — Progress Notes (Deleted)
Name: [name]  MRN/ DOB: [id number], 1966-09-06    Age/ Sex: 57 y.o., female    PCP: Biagio Borg, MD   Reason for Endocrinology Evaluation: Osteoporosis      Date of Initial Endocrinology Evaluation: 11/04/2020    HPI: Ms. [name] is a 57 y.o. female with a past medical history of Dyslipidemia, Hx of infarct on stress test ,  and Osteoporosis . The patient presented for initial endocrinology clinic visit on 11/04/2020  for consultative assistance with her Osteoporosis .   Pt was diagnosed with osteoporosis: 2013  with a Tscore of -2.6 at the femoral neck   Menarche at age : 40 Menopausal at age : S/P hysterectomy  Around age late 58's Fracture Hx: Compression spinal vertebrae on Xray  Hx of HRT: no  FH of osteoporosis or hip fracture: no Prior Hx of anti-estrogenic therapy : no Prior Hx of anti-resorptive therapy : I have offered her prolia in 01/2021 but she declined due to her concerns about hyperlipidemia. She declined anabolic agents due to concerns about side effects. Started Alendronate 01/2021   24-hr urinary cortisol was normal 02/2021 24-hr urinary calcium was normal 148 mg 02/2021 SUBJECTIVE:    Today (10/27/22):  Ms. [name] is here for a follow up on osteoporosis.   She denies any hearburn or any abdominal pain  Denies constipation or diarrhea  No recent fractures   She is on Vitamin D 3000 iu daily  Calcium 1200 mg daily  Alendronate 70 mg weekly   HISTORY:  Past Medical History:  Past Medical History:  Diagnosis Date   ANXIETY 03/27/2010   BACK PAIN, CHRONIC 03/27/2010   Classic migraine with aura 05/30/2015   COMMON MIGRAINE 03/27/2010   [place] DISEASE, CERVICAL 03/27/2010   FATIGUE 03/27/2010   GERD 03/27/2010   Glaucoma    bilateral- on meds (eye drops)   HYPERLIPIDEMIA 03/27/2010   on meds   Multiple sclerosis ([place]) 10/23/2010   unconfirmed, but suspected   Myocardial infarction ([place])    Obesity    Osteoporosis 09/24/2012   on  meds   Palpitations    Seasonal allergies    Unspecified vitamin D deficiency 10/23/2010   Past Surgical History:  Past Surgical History:  Procedure Laterality Date   ABDOMINAL HYSTERECTOMY     partial-uterus removed   LEFT HEART CATHETERIZATION WITH CORONARY ANGIOGRAM N/A 03/31/2014   Procedure: LEFT HEART CATHETERIZATION WITH CORONARY ANGIOGRAM;  Surgeon: Pixie Casino, MD;  Location[facility] C[facility] LAB;  Service: Cardiovascular;  Laterality: N/A;   Neg stress test  09/08/2005   s/p eye surgury     s/p knee surgury     WISDOM TOOTH EXTRACTION      Social History:  reports that she has never smoked. She has never used smokeless tobacco. She reports that she does not drink alcohol and does not use drugs. Family History: family history includes Arthritis in an other family member; Cancer in her mother and another family member; Colon polyps in her father and mother; Dementia in an other family member; Diabetes in her father and another family member; Heart disease in her father and another family member; Hyperlipidemia in her father and mother; Hypertension in her father; Kidney disease in her father; Lupus in her cousin and maternal aunt; Thyroid disease in her father.   HOME MEDICATIONS: Allergies as of 10/27/2022       Reactions   Topamax Other (See Comments)   dizziness  Medication List        Accurate as of October 27, 2022  7:28 AM. If you have any questions, ask your nurse or doctor.          alendronate 70 MG tablet Commonly known as: FOSAMAX Take 1 tablet (70 mg total) by mouth every 7 (seven) days. Take with a full glass of water on an empty stomach.   aspirin EC 81 MG tablet Take 1 tablet (81 mg total) by mouth daily. Swallow whole.   brimonidine 0.2 % ophthalmic solution Commonly known as: ALPHAGAN Place 1 drop into both eyes 3 (three) times daily.   Efinaconazole 10 % Soln Apply 1 drop topically daily.   ezetimibe-simvastatin 10-40 MG  tablet Commonly known as: VYTORIN TAKE 1 TABLET BY MOUTH DAILY.   meloxicam 15 MG tablet Commonly known as: MOBIC Take 1 tablet (15 mg total) by mouth daily.   nabumetone 500 MG tablet Commonly known as: RELAFEN Take 1 tablet (500 mg total) by mouth 2 (two) times daily as needed for moderate pain.   timolol 0.5 % ophthalmic solution Commonly known as: TIMOPTIC 1 drop 2 (two) times daily.   traMADol 50 MG tablet Commonly known as: ULTRAM Take 1 tablet (50 mg total) by mouth every 8 (eight) hours as needed.   Vitamin D (Ergocalciferol) 1.25 MG (50000 UNIT) Caps capsule Commonly known as: DRISDOL Take 1 capsule (50,000 Units total) by mouth every 7 (seven) days.            OBJECTIVE:  VS: There were no vitals taken for this visit.   Wt Readings from Last 3 Encounters:  10/08/22 229 lb (103.9 kg)  10/07/22 229 lb (103.9 kg)  09/16/22 217 lb (98.4 kg)     EXAM: General: Pt appears well and is in NAD  Neck: General: Supple without adenopathy. Thyroid: Thyroid size normal.  No goiter or nodules appreciated. No thyroid bruit.  Lungs: Clear with good BS bilat with no rales, rhonchi, or wheezes  Heart: Auscultation: RRR.  Abdomen: Normoactive bowel sounds, soft, nontender, without masses or organomegaly palpable  Extremities:  BL LE: No pretibial edema normal ROM and strength.  Skin: Hair: Texture and amount normal with gender appropriate distribution Skin Inspection: No rashes Skin Palpation: Skin temperature, texture, and thickness normal to palpation  Neuro: Cranial nerves: II - XII grossly intact  Motor: Normal strength throughout DTRs: 2+ and symmetric in UE without delay in relaxation phase  Mental Status: Judgment, insight: Intact Memory: Intact for recent and remote events Mood and affect: No depression, anxiety, or agitation     DATA REVIEWED:   Latest Reference Range & Units 10/25/21 11:25  Sodium 135 - 145 mEq/L 138  Potassium 3.5 - 5.1 mEq/L 4.7   Chloride 96 - 112 mEq/L 104  CO2 19 - 32 mEq/L 31  Glucose 70 - 99 mg/dL 87  BUN 6 - 23 mg/dL 9  Creatinine 0.40 - 1.20 mg/dL 0.59  Calcium 8.4 - 10.5 mg/dL 9.1  GFR >60.00 mL/min 100.85    Latest Reference Range & Units 10/25/21 11:25  VITD 30.00 - 100.00 ng/mL 36.82    Latest Reference Range & Units 10/25/21 11:25  TSH 0.35 - 5.50 uIU/mL 1.09  T4,Free(Direct) 0.60 - 1.60 ng/dL 0.85      DXA 02/08/2021   Results:   Lumbar spine L1-L4 Femoral neck (FN)  T-score -2.4 RFN: -2.4 LFN: -2.7  Change in BMD from previous DXA test (%) down 13% Up 1.5%  (*) statistically  significant   Assessment: Patient has OSTEOPOROSIS according to the [facility] classification for osteoporosis (see below).   Thoracic Spine X-Ray 01/25/21   Old mild superior endplate compression deformities are seen involving the T9 and T10 vertebral bodies, which are stable since prior exam. No evidence of acute thoracic spine fracture or subluxation. No focal lytic or sclerotic bone lesions identified. Anterior vertebral osteophytosis is noted at T11-12. Intervertebral disc spaces are maintained. Mild lower thoracic dextroscoliosis is also stable since prior study.   IMPRESSION: No acute findings.   Old mild T9 and T10 vertebral body compression deformities and mild lower thoracic dextroscoliosis. ASSESSMENT/PLAN/RECOMMENDATIONS:   Osteoporosis:  - Pt has an old compression fracture of the thoracic spine based on that information I have recommended Evenity or Forteo/Tymlos. But she declined these due to family hx of strokes, CAD and cancer, despite my assurance she is low risk.  - She declined Prolia due to er concerns of hyperlipidemia  - Emphasized the importance of calcium and vitamin D intake as well as weight bearing exercises  -Tolerating alendronate, no changes -BMP vitamin D normal today   Medications : Vitamin D 3000 iu daily  Calcium 1200 mg daily  Alendronate 70 mg weekly     F/U in 1  yr   Signed electronically by: Mack Guise, MD  [facility] Endocrinology  [facility] [address] [place], [place] [address] Phone: [phone] FAX: [phone]   CC: [address] [place], [place] [address] Phone: [phone] Fax: [phone]   Return to Endocrinology clinic as below: Future Appointments  Date Time Provider [facility]  10/27/2022 10:10 AM Nicollette Wilhelmi, Melanie Crazier, MD [facility] None  11/04/2022  2:45 PM [facility]315 DG C-ARM RM 3 GI-315DG [facility] W. WE  11/04/2022  3:20 PM GI-315 MR 1 GI-315MRI [facility] W. WE  11/11/2022 10:20 AM Mellody Dance, DO [facility] None  11/12/2022  8:00 AM Rankin, Clent Demark, MD [facility] None  11/24/2022 10:15 AM Trula Slade, DPM [facility]  01/23/2023 10:00 AM Biagio Borg, MD [facility] None

## 2022-11-03 ENCOUNTER — Other Ambulatory Visit: Payer: Self-pay | Admitting: Internal Medicine

## 2022-11-04 ENCOUNTER — Other Ambulatory Visit: Payer: Self-pay | Admitting: Sports Medicine

## 2022-11-04 ENCOUNTER — Ambulatory Visit
Admission: RE | Admit: 2022-11-04 | Discharge: 2022-11-04 | Disposition: A | Discharge status: Home / Self Care | Payer: Medicare HMO | Source: Ambulatory Visit | Attending: Sports Medicine | Admitting: Sports Medicine

## 2022-11-04 DIAGNOSIS — S46011A Strain of muscle(s) and tendon(s) of the rotator cuff of right shoulder, initial encounter: Secondary | ICD-10-CM | POA: Diagnosis not present

## 2022-11-04 DIAGNOSIS — M25611 Stiffness of right shoulder, not elsewhere classified: Secondary | ICD-10-CM | POA: Diagnosis not present

## 2022-11-04 DIAGNOSIS — M19011 Primary osteoarthritis, right shoulder: Secondary | ICD-10-CM

## 2022-11-04 DIAGNOSIS — G8929 Other chronic pain: Secondary | ICD-10-CM

## 2022-11-04 MED ORDER — IOPAMIDOL (ISOVUE-M 200) INJECTION 41%
15.0000 mL | Freq: Once | INTRAMUSCULAR | Status: AC
  Administered 2022-11-04: 15 mL via INTRA_ARTICULAR

## 2022-11-05 ENCOUNTER — Encounter: Payer: Self-pay | Admitting: Internal Medicine

## 2022-11-05 ENCOUNTER — Ambulatory Visit (INDEPENDENT_AMBULATORY_CARE_PROVIDER_SITE_OTHER): Payer: Medicare HMO | Admitting: Internal Medicine

## 2022-11-05 ENCOUNTER — Telehealth: Payer: Self-pay | Admitting: Internal Medicine

## 2022-11-05 VITALS — BP 110/70 | HR 85 | Ht 64.0 in | Wt 235.0 lb

## 2022-11-05 DIAGNOSIS — M810 Age-related osteoporosis without current pathological fracture: Secondary | ICD-10-CM

## 2022-11-05 MED ORDER — ALENDRONATE SODIUM 70 MG PO TABS: 70.0000 mg | ORAL_TABLET | ORAL | 3 refills | Status: DC

## 2022-11-05 NOTE — Patient Instructions (Signed)
Continue Fosamax 70 mg weekly Continue Calcium 1200 mg daily

## 2022-11-05 NOTE — Telephone Encounter (Signed)
Please schedule a bone density at the [facility] prefers Friday Mornings

## 2022-11-05 NOTE — Telephone Encounter (Signed)
Patient scheduled for 11/14/22 at 9:30am

## 2022-11-05 NOTE — Progress Notes (Signed)
Name: [name]  MRN/ DOB: [id number], 01/06/1966    Age/ Sex: 57 y.o., female    PCP: Biagio Borg, MD   Reason for Endocrinology Evaluation: Osteoporosis      Date of Initial Endocrinology Evaluation: 11/04/2020    HPI: [name] is a 57 y.o. female with a past medical history of Dyslipidemia, Hx of infarct on stress test ,  and Osteoporosis . The patient presented for initial endocrinology clinic visit on 11/04/2020  for consultative assistance with her Osteoporosis .   Pt was diagnosed with osteoporosis: 2013  with a Tscore of -2.6 at the femoral neck   Menarche at age : 55 Menopausal at age : S/P hysterectomy  Around age late 72's Fracture Hx: Compression spinal vertebrae on Xray  Hx of HRT: no  FH of osteoporosis or hip fracture: no Prior Hx of anti-estrogenic therapy : no Prior Hx of anti-resorptive therapy : I have offered her prolia in 01/2021 but she declined due to her concerns about hyperlipidemia. She declined anabolic agents due to concerns about side effects. Started Alendronate 01/2021   24-hr urinary cortisol was normal 02/2021 24-hr urinary calcium was normal 148 mg 02/2021 SUBJECTIVE:    Today (11/05/22):  [name] is here for a follow up on osteoporosis.   She follows with sports medicine for chronic right shoulder pain , she is on Meloxicam, received intra-articular injection yesterday  She follows with [facility]  She denies any hearburn  Stable chronic constipation  Denies recent falls  No recent fractures   Vitamin D 50,000 weekly  Calcium 1200 mg daily  Alendronate 70 mg weekly   HISTORY:  Past Medical History:  Past Medical History:  Diagnosis Date   ANXIETY 03/27/2010   BACK PAIN, CHRONIC 03/27/2010   Classic migraine with aura 05/30/2015   COMMON MIGRAINE 03/27/2010   [place] DISEASE, CERVICAL 03/27/2010   FATIGUE 03/27/2010   GERD 03/27/2010   Glaucoma    bilateral- on meds (eye drops)    HYPERLIPIDEMIA 03/27/2010   on meds   Multiple sclerosis ([place]) 10/23/2010   unconfirmed, but suspected   Myocardial infarction ([place])    Obesity    Osteoporosis 09/24/2012   on meds   Palpitations    Seasonal allergies    Unspecified vitamin D deficiency 10/23/2010   Past Surgical History:  Past Surgical History:  Procedure Laterality Date   ABDOMINAL HYSTERECTOMY     partial-uterus removed   LEFT HEART CATHETERIZATION WITH CORONARY ANGIOGRAM N/A 03/31/2014   Procedure: LEFT HEART CATHETERIZATION WITH CORONARY ANGIOGRAM;  Surgeon: Pixie Casino, MD;  [facility] C[facility] LAB;  Service: Cardiovascular;  Laterality: N/A;   Neg stress test  09/08/2005   s/p eye surgury     s/p knee surgury     WISDOM TOOTH EXTRACTION      Social History:  reports that she has never smoked. She has never used smokeless tobacco. She reports that she does not drink alcohol and does not use drugs. Family History: family history includes Arthritis in an other family member; Cancer in her mother and another family member; Colon polyps in her father and mother; Dementia in an other family member; Diabetes in her father and another family member; Heart disease in her father and another family member; Hyperlipidemia in her father and mother; Hypertension in her father; Kidney disease in her father; Lupus in her cousin and maternal aunt; Thyroid disease in her father.  HOME MEDICATIONS: Allergies as of 11/05/2022       Reactions   Topamax Other (See Comments)   dizziness        Medication List        Accurate as of November 05, 2022  9:26 AM. If you have any questions, ask your nurse or doctor.          alendronate 70 MG tablet Commonly known as: FOSAMAX Take 1 tablet (70 mg total) by mouth every 7 (seven) days. Take with a full glass of water on an empty stomach.   aspirin EC 81 MG tablet Take 1 tablet (81 mg total) by mouth daily. Swallow whole.   brimonidine 0.2 % ophthalmic  solution Commonly known as: ALPHAGAN Place 1 drop into both eyes 3 (three) times daily.   Efinaconazole 10 % Soln Apply 1 drop topically daily.   ezetimibe-simvastatin 10-40 MG tablet Commonly known as: VYTORIN TAKE 1 TABLET BY MOUTH DAILY.   meloxicam 15 MG tablet Commonly known as: MOBIC Take 1 tablet (15 mg total) by mouth daily.   nabumetone 500 MG tablet Commonly known as: RELAFEN Take 1 tablet (500 mg total) by mouth 2 (two) times daily as needed for moderate pain.   timolol 0.5 % ophthalmic solution Commonly known as: TIMOPTIC 1 drop 2 (two) times daily.   traMADol 50 MG tablet Commonly known as: ULTRAM Take 1 tablet (50 mg total) by mouth every 8 (eight) hours as needed.   Vitamin D (Ergocalciferol) 1.25 MG (50000 UNIT) Caps capsule Commonly known as: DRISDOL Take 1 capsule (50,000 Units total) by mouth every 7 (seven) days.            OBJECTIVE:  VS: BP 110/70 (BP Location: Left Arm, Patient Position: Sitting, Cuff Size: Large)   Pulse 85   Ht '5\' 4"'$  (1.626 m)   Wt 235 lb (106.6 kg)   SpO2 96%   BMI 40.34 kg/m    Wt Readings from Last 3 Encounters:  11/05/22 235 lb (106.6 kg)  10/08/22 229 lb (103.9 kg)  10/07/22 229 lb (103.9 kg)     EXAM: General: Pt appears well and is in NAD  Neck: General: Supple without adenopathy. Thyroid: Thyroid size normal.  No goiter or nodules appreciated. No thyroid bruit.  Lungs: Clear with good BS bilat with no rales, rhonchi, or wheezes  Heart: Auscultation: RRR.  Abdomen: Normoactive bowel sounds, soft, nontender, without masses or organomegaly palpable  Extremities:  BL LE: No pretibial edema normal ROM and strength.  Skin: Hair: Texture and amount normal with gender appropriate distribution Skin Inspection: No rashes Skin Palpation: Skin temperature, texture, and thickness normal to palpation  Neuro: Cranial nerves: II - XII grossly intact  Motor: Normal strength throughout DTRs: 2+ and symmetric in UE  without delay in relaxation phase  Mental Status: Judgment, insight: Intact Memory: Intact for recent and remote events Mood and affect: No depression, anxiety, or agitation     DATA REVIEWED:  Latest Reference Range & Units 04/22/22 16:25  Sodium 134 - 144 mmol/L 141  Potassium 3.5 - 5.2 mmol/L 4.4  Chloride 96 - 106 mmol/L 103  CO2 20 - 29 mmol/L 19 (L)  Glucose 70 - 99 mg/dL 67 (L)  BUN 6 - 24 mg/dL 6  Creatinine 0.57 - 1.00 mg/dL 0.55 (L)  Calcium 8.7 - 10.2 mg/dL 8.9  BUN/Creatinine Ratio 9 - 23  11  eGFR >59 mL/min/1.73 108  Alkaline Phosphatase 44 - 121 IU/L 89  Albumin 3.8 -  4.9 g/dL 4.1  Albumin/Globulin Ratio 1.2 - 2.2  1.4  AST 0 - 40 IU/L 30  ALT 0 - 32 IU/L 25  Total Protein 6.0 - 8.5 g/dL 7.1  Total Bilirubin 0.0 - 1.2 mg/dL 0.3     Latest Reference Range & Units 04/22/22 16:25  Folate >3.0 ng/mL >20.0  Vitamin D, 25-Hydroxy 30.0 - 100.0 ng/mL 36.3  Vitamin B12 232 - 1,245 pg/mL 1,248 (H)     Latest Reference Range & Units 04/22/22 16:25  TSH 0.450 - 4.500 uIU/mL 0.919  T4,Free(Direct) 0.82 - 1.77 ng/dL 1.17      DXA 02/08/2021   Results:   Lumbar spine L1-L4 Femoral neck (FN)  T-score -2.4 RFN: -2.4 LFN: -2.7  Change in BMD from previous DXA test (%) down 13% Up 1.5%  (*) statistically significant   Assessment: Patient has OSTEOPOROSIS according to the [facility] classification for osteoporosis (see below).   Thoracic Spine X-Ray 01/25/21   Old mild superior endplate compression deformities are seen involving the T9 and T10 vertebral bodies, which are stable since prior exam. No evidence of acute thoracic spine fracture or subluxation. No focal lytic or sclerotic bone lesions identified. Anterior vertebral osteophytosis is noted at T11-12. Intervertebral disc spaces are maintained. Mild lower thoracic dextroscoliosis is also stable since prior study.   IMPRESSION: No acute findings.   Old mild T9 and T10 vertebral body compression deformities  and mild lower thoracic dextroscoliosis. ASSESSMENT/PLAN/RECOMMENDATIONS:   Osteoporosis:  - Pt has an old compression fracture of the thoracic spine based on that information I have recommended Evenity or Forteo/Tymlos. But she declined these due to family hx of strokes, CAD and cancer, despite my assurance she is low risk.  - She declined Prolia due to concerns of hyperlipidemia  -She is tolerating alendronate without side effects -We again emphasized the importance of optimizing calcium and vitamin D intake -She is limited in exercise due to shoulder pain -She is due for DXA scan in June of this year, this was ordered  Medications : Vitamin D 3000 iu daily  Calcium 1200 mg daily  Alendronate 70 mg weekly     F/U in 1 yr   Signed electronically by: Mack Guise, MD  [facility]  [facility] [address] [place], [place] [address] Phone: [phone] FAX: [phone]   CC: [address], [place], [place] [address] Phone: [phone] Fax: [phone]   Return to Endocrinology clinic as below: Future Appointments  Date Time Provider [place]  11/05/2022  9:30 AM Ori Kreiter, Melanie Crazier, MD [facility] None  11/07/2022  9:30 AM Glennon Mac, DO [facility] None  11/11/2022 10:20 AM Mellody Dance, DO [facility] None  11/12/2022  8:00 AM Zadie Rhine Clent Demark, MD [facility] None  11/24/2022 10:15 AM Trula Slade, DPM [facility]  01/23/2023 10:00 AM Biagio Borg, MD [facility] None

## 2022-11-06 NOTE — Progress Notes (Signed)
[name] D.[name] Phone: [phone]   Assessment and Plan:     1. Chronic right shoulder pain 2. Primary osteoarthritis of right shoulder 3. Right supraspinatus tendinitis  -Chronic with exacerbation, subsequent visit - Reviewed MRI with patient showing moderate to severe supraspinatus tendinosis, subacromial bursitis, subdeltoid bursitis, moderate glenohumeral osteoarthritis, and small superior labral tear - Patient received no benefit from intra-articular CSI with contrast performed during MRI - Patient has had CSI in the past, but is agreeable to repeating subacromial CSI at our office visit for the first time.  Tolerated well per note below - Continue HEP and start physical therapy.  Referral sent.  Procedure: Subacromial Injection Side: Right  Risks explained and consent was given verbally. The site was cleaned with alcohol prep. A steroid injection was performed from posterior approach using 40m of 1% lidocaine without epinephrine and 252mof kenalog '40mg'$ /ml. This was well tolerated and resulted in symptomatic relief.  Needle was removed, hemostasis achieved, and post injection instructions were explained.   Pt was advised to call or return to clinic if these symptoms worsen or fail to improve as anticipated.   Pertinent previous records reviewed include  MRI right shoulder 11/04/2022, arthrogram procedure note 11/04/2022   Follow Up: 4 weeks for reevaluation.  If no improvement or worsening of symptoms, would consider referral to orthopedic surgery   Subjective:   I, [name], am serving as a scEducation administratoror Doctor BeGlennon Mac Chief Complaint: right shoulder pain    HPI:    10/08/2022 Patient is a [age]ear old female complaining of right shoulder pain. Patient states  injury 2 years ago, was getting CSI and they didn't help her, has decreased ROM , pain at night , advil for the pain and  that doesn't help, no numbness or tingling , pain radiates to the elbow , not able to lift things has to use the left hand   11/07/2022 Patient states that he is the same      Relevant Historical Information: MS  Additional pertinent review of systems negative.   Current Outpatient Medications:    alendronate (FOSAMAX) 70 MG tablet, Take 1 tablet (70 mg total) by mouth every 7 (seven) days. Take with a full glass of water on an empty stomach., Disp: 13 tablet, Rfl: 3   aspirin 81 MG EC tablet, Take 1 tablet (81 mg total) by mouth daily. Swallow whole., Disp: 30 tablet, Rfl: 12   brimonidine (ALPHAGAN) 0.2 % ophthalmic solution, Place 1 drop into both eyes 3 (three) times daily., Disp: , Rfl:    Efinaconazole 10 % SOLN, Apply 1 drop topically daily., Disp: 4 mL, Rfl: 11   ezetimibe-simvastatin (VYTORIN) 10-40 MG tablet, TAKE 1 TABLET BY MOUTH DAILY., Disp: 30 tablet, Rfl: 10   meloxicam (MOBIC) 15 MG tablet, Take 1 tablet (15 mg total) by mouth daily., Disp: 30 tablet, Rfl: 0   nabumetone (RELAFEN) 500 MG tablet, Take 1 tablet (500 mg total) by mouth 2 (two) times daily as needed for moderate pain., Disp: 60 tablet, Rfl: 1   timolol (TIMOPTIC) 0.5 % ophthalmic solution, 1 drop 2 (two) times daily., Disp: , Rfl:    traMADol (ULTRAM) 50 MG tablet, Take 1 tablet (50 mg total) by mouth every 8 (eight) hours as needed., Disp: 20 tablet, Rfl: 1   Vitamin D, Ergocalciferol, (DRISDOL) 1.25 MG (50000 UNIT) CAPS capsule, Take 1 capsule (50,000 Units total)  by mouth every 7 (seven) days., Disp: 4 capsule, Rfl: 0  Current Facility-Administered Medications:    0.9 %  sodium chloride infusion, 500 mL, Intravenous, Once, Ladene Artist, MD   Objective:     Vitals:   11/07/22 0915  BP: 122/82  Pulse: 80  SpO2: 99%  Weight: 234 lb (106.1 kg)  Height: '5\' 4"'$  (1.626 m)      Body mass index is 40.17 kg/m.    Physical Exam:    Gen: Appears well, nad, nontoxic and pleasant Neuro:sensation intact,  strength is 5/5 with df/pf/inv/ev, muscle tone wnl Skin: no suspicious lesion or defmority Psych: A&O, appropriate mood and affect   Right shoulder: no deformity, swelling or muscle wasting No scapular winging FF 50, abd 50, int 25, ext 60 TTP globally Unable to perform special testing due to significantly limited ROM and global pain FROM of neck     Electronically signed by:  [name] D.[name] Sports Medicine 9:45 AM 11/07/22

## 2022-11-07 ENCOUNTER — Ambulatory Visit (INDEPENDENT_AMBULATORY_CARE_PROVIDER_SITE_OTHER): Payer: Medicare HMO | Admitting: Sports Medicine

## 2022-11-07 VITALS — BP 122/82 | HR 80 | Ht 64.0 in | Wt 234.0 lb

## 2022-11-07 DIAGNOSIS — M25511 Pain in right shoulder: Secondary | ICD-10-CM

## 2022-11-07 DIAGNOSIS — M19011 Primary osteoarthritis, right shoulder: Secondary | ICD-10-CM

## 2022-11-07 DIAGNOSIS — M7591 Shoulder lesion, unspecified, right shoulder: Secondary | ICD-10-CM | POA: Diagnosis not present

## 2022-11-07 DIAGNOSIS — G8929 Other chronic pain: Secondary | ICD-10-CM

## 2022-11-07 NOTE — Patient Instructions (Addendum)
Good to see you PT referral  Continue HEP  4 week follow up

## 2022-11-11 ENCOUNTER — Ambulatory Visit (INDEPENDENT_AMBULATORY_CARE_PROVIDER_SITE_OTHER): Payer: Medicare HMO | Admitting: Family Medicine

## 2022-11-12 ENCOUNTER — Encounter (INDEPENDENT_AMBULATORY_CARE_PROVIDER_SITE_OTHER): Payer: Self-pay

## 2022-11-12 ENCOUNTER — Encounter (INDEPENDENT_AMBULATORY_CARE_PROVIDER_SITE_OTHER): Payer: PRIVATE HEALTH INSURANCE | Admitting: Ophthalmology

## 2022-11-12 DIAGNOSIS — H4423 Degenerative myopia, bilateral: Secondary | ICD-10-CM | POA: Diagnosis not present

## 2022-11-12 DIAGNOSIS — H3322 Serous retinal detachment, left eye: Secondary | ICD-10-CM | POA: Diagnosis not present

## 2022-11-12 DIAGNOSIS — H4053X2 Glaucoma secondary to other eye disorders, bilateral, moderate stage: Secondary | ICD-10-CM | POA: Diagnosis not present

## 2022-11-14 ENCOUNTER — Inpatient Hospital Stay: Admission: RE | Admit: 2022-11-14 | Payer: Medicare HMO | Source: Ambulatory Visit

## 2022-11-24 ENCOUNTER — Ambulatory Visit (INDEPENDENT_AMBULATORY_CARE_PROVIDER_SITE_OTHER): Payer: PRIVATE HEALTH INSURANCE | Admitting: Podiatry

## 2022-11-24 DIAGNOSIS — B351 Tinea unguium: Secondary | ICD-10-CM

## 2022-11-24 NOTE — Patient Instructions (Signed)
You can alternate with UREA NAIL GEL on the big toenail with the Jublia on the right big toenail

## 2022-11-24 NOTE — Progress Notes (Signed)
Subjective: Chief Complaint  Patient presents with   Follow-up    Patient stated that the soreness has subsided but the nail is growing back dark and thick    57 year old female presents the office today with above concerns.  She states that the right big toenail is growing back thick and discolored.  She has been using Jublia on the other nails but not the big toenail.  Soreness is subsided.  No swelling redness or drainage.  No open lesions.  No other concerns.  Objective: AAO x3, NAD DP/PT pulses palpable bilaterally, CRT less than 3 seconds Right hallux nails hypertrophic, dystrophic and there is some clearing on the proximal nail fold however which where the nail still discolored with discoloration with some hyperpigmentation.  There is no extensively hyperpigmentation of the surrounding skin. No pain with calf compression, swelling, warmth, erythema  Assessment: Right hallux onychodystrophy, onychomycosis  Plan: -All treatment options discussed with the patient including all alternatives, risks, complications.  -I did debride the nail with any complications or bleeding.  I would like for her to start using Jublia on the toenail as well we discussed urea nail gel.  If no improvement consider further biopsy. -Patient encouraged to call the office with any questions, concerns, change in symptoms.   Trula Slade DPM

## 2022-12-01 ENCOUNTER — Encounter (INDEPENDENT_AMBULATORY_CARE_PROVIDER_SITE_OTHER): Payer: Self-pay | Admitting: Family Medicine

## 2022-12-01 ENCOUNTER — Ambulatory Visit (INDEPENDENT_AMBULATORY_CARE_PROVIDER_SITE_OTHER): Payer: PRIVATE HEALTH INSURANCE | Admitting: Family Medicine

## 2022-12-01 VITALS — BP 126/89 | HR 71 | Temp 98.1°F | Ht 64.0 in | Wt 219.8 lb

## 2022-12-01 DIAGNOSIS — E559 Vitamin D deficiency, unspecified: Secondary | ICD-10-CM

## 2022-12-01 DIAGNOSIS — E7800 Pure hypercholesterolemia, unspecified: Secondary | ICD-10-CM | POA: Insufficient documentation

## 2022-12-01 DIAGNOSIS — F430 Acute stress reaction: Secondary | ICD-10-CM | POA: Diagnosis not present

## 2022-12-01 DIAGNOSIS — Z6841 Body Mass Index (BMI) 40.0 and over, adult: Secondary | ICD-10-CM | POA: Insufficient documentation

## 2022-12-01 DIAGNOSIS — R7303 Prediabetes: Secondary | ICD-10-CM

## 2022-12-01 DIAGNOSIS — E538 Deficiency of other specified B group vitamins: Secondary | ICD-10-CM | POA: Diagnosis not present

## 2022-12-01 MED ORDER — VITAMIN D (ERGOCALCIFEROL) 1.25 MG (50000 UNIT) PO CAPS: 50000.0000 [IU] | ORAL_CAPSULE | ORAL | 0 refills | Status: DC

## 2022-12-01 NOTE — Progress Notes (Signed)
[name], D.O.  ABFM, ABOM Specializing in Clinical Bariatric Medicine  Office located at: [address], [place]  [address]     Assessment and Plan:   Orders Placed This Encounter  Procedures   Comprehensive metabolic panel   Hemoglobin A1c   Insulin, random   Lipid Panel With LDL/HDL Ratio   T4, free   TSH   Vitamin B12   VITAMIN D 25 Hydroxy (Vit-D Deficiency, Fractures)   CBC with Differential/Platelet    Medications Discontinued During This Encounter  Medication Reason   Vitamin D, Ergocalciferol, (DRISDOL) 1.25 MG (50000 UNIT) CAPS capsule Reorder     Meds ordered this encounter  Medications   Vitamin D, Ergocalciferol, (DRISDOL) 1.25 MG (50000 UNIT) CAPS capsule    Sig: Take 1 capsule (50,000 Units total) by mouth every 7 (seven) days.    Dispense:  4 capsule    Refill:  0     Pre-diabetes Assessment: Condition is Not optimized. Lab Results  Component Value Date   HGBA1C 5.9 (H) 04/22/2022   HGBA1C 6.0 01/23/2022   HGBA1C 5.8 07/26/2021   INSULIN 8.7 04/22/2022  Her pre-diabetes is currently being managed by diet. She is not on any medications. She reports that her cravings and hunger are well controlled when she eats on plan.  Plan: Recheck labs today. Continue her prudent nutritional plan and continue to advance exercise and cardiovascular fitness as tolerated.    Vitamin D deficiency Assessment: Condition is Not optimized. Lab Results  Component Value Date   VD25OH 36.3 04/22/2022   VD25OH 44.00 01/23/2022   VD25OH 36.82 10/25/2021  She has been taking OTC Vitamin D 5K IU daily b/c she was lost to f/up d/t increased life stressors and fell off schedule with her Ergo doses.  Denies any side effects.  Plan: Recheck Vitamin D. Restart Ergocalciferol 50K IU weekly  - weight loss will likely improve availability of vitamin D, thus encouraged Darius to continue with meal plan and their weight loss efforts to further improve this  condition.  Thus, we will need to monitor levels regularly (every 3-4 mo on average) to keep levels within normal limits and prevent over supplementation.   Acute Stress Disorder Assessment: Condition is Improving, but not optimized. Recently she has been stressed because of taking care of her niece's baby. Now, she is no longer taking care of the baby and is doing emotionally better. However, she still reports feeling slightly anxious. She sleeps 5-6hrs per night.  Plan: Recheck Thyroid - I recommended her to get 7-8hrs of sleep per night - I also emphasized that following her meal plan and exercising will help her feel emotionally better.   Vitamin B12 deficiency Assessment: Condition is Not optimized..  Lab Results  Component Value Date   VITAMINB12 1,248 (H) 04/22/2022  Patient states that she takes an OTC Super B supplement.   Plan: Recheck today. Continue OTC Super B supplement. Continue her prudent nutritional plan and continue to advance exercise and cardiovascular fitness as tolerated.  I want her to inform me about how much Vitamin B 12 she is taking and how often in next OV.    Hypercholesterolemia Assessment: Condition is Not optimized..  Lab Results  Component Value Date   CHOL 166 04/22/2022   HDL 57 04/22/2022   LDLCALC 88 04/22/2022   LDLDIRECT 184.9 08/04/2012   TRIG 115 04/22/2022   CHOLHDL 3 01/23/2022  She has been compliant with Vytorin 10-40 mg daily .  Denies any side effects.  Plan: Recheck Today. Continue with Vytorin 10-40 mg daily as recommended by PCP/specialist.  - I stressed the importance that patient continue with our prudent nutritional plan that is low in saturated and trans fats, and low in fatty carbs to improve these numbers.   - We will continue routine screening as patient continues to achieve health goals along their weight loss journey   TREATMENT PLAN FOR OBESITY: BMI 40.0-44.9, adult (HCC)-current bmi Morbid obesity (HCC)-start bmi  41.54/date 04/22/22 Assessment: Condition is Not optimized. Biometric data collected today, was reviewed with patient.  Fat mass is 111.4lbs  Muscle mass is 103lbs. Total body water has is 76.4 lbs  Plan: Continue with Category 1 meal plan with breakfast and lunch options.    Behavioral Intervention Additional resources provided today: category 1 meal plan information, breakfast options, and lunch options Evidence-based interventions for health behavior change were utilized today including the discussion of self monitoring techniques, problem-solving barriers and SMART goal setting techniques.   Regarding patient's less desirable eating habits and patterns, we employed the technique of small changes.  Pt will specifically work on: follow the meal plan, sleeping 7-9 hrs of day, returning to exercise regiment for next visit.    Recommended Physical Activity Goals Teyla has been advised to work up to 150 minutes of moderate intensity aerobic activity a week and strengthening exercises 2-3 times per week for cardiovascular health, weight loss maintenance and preservation of muscle mass.  She has agreed to Think about ways to increase physical activity  FOLLOW UP: Return in about 4 weeks (around 12/29/2022). She was informed of the importance of frequent follow up visits to maximize her success with intensive lifestyle modifications for her multiple health conditions.  Subjective:   Chief complaint: Obesity Felcia is here to discuss her progress with her obesity treatment plan. She is on the the Category 1 Plan and states she is following her eating plan approximately 0% of the time. She states she is not exercising.   Interval History:  [name] is here for a follow up office visit. Since her last office visit with Dr.Bowen, she had to take care of her nieces baby. Due to this, she was stressed, overwhelmed, and fell off the meal plan. However the baby was sent to a foster home and on  March 9th she returned back to the meal plan.She is not having any liquid calories. Also, she has stopped using the treadmill/exercising after having to take care of the baby.   We reviewed her meal plan and all questions were answered. Patient's food recall appears to be accurate and consistent with what is on plan when she is following it. When eating on plan, her hunger and cravings are well controlled.     Review of Systems:  Pertinent positives were addressed with patient today.  Weight Summary and Biometrics   Weight Lost Since Last Visit: 0  Weight Gained Since Last Visit: 2lb    Vitals Temp: 98.1 F (36.7 C) BP: 126/89 Pulse Rate: 71 SpO2: 100 %   Anthropometric Measurements Height: 5\' 4"  (1.626 m) Weight: 219 lb 12.8 oz (99.7 kg) BMI (Calculated): 37.71 Weight at Last Visit: 217lb Weight Lost Since Last Visit: 0 Weight Gained Since Last Visit: 2lb Starting Weight: 242lb Total Weight Loss (lbs): 23 lb (10.4 kg)   Body Composition  Body Fat %: 50.7 % Fat Mass (lbs): 111.4 lbs Muscle Mass (lbs): 103 lbs Total Body Water (lbs): 76.4 lbs Visceral  Fat Rating : 15   Other Clinical Data Fasting: yes Labs: yes Today's Visit #: 9 Starting Date: 04/22/22    Objective:   PHYSICAL EXAM:  Blood pressure 126/89, pulse 71, temperature 98.1 F (36.7 C), height 5\' 4"  (1.626 m), weight 219 lb 12.8 oz (99.7 kg), SpO2 100 %. Body mass index is 37.73 kg/m.  General: Well Developed, well nourished, and in no acute distress.  HEENT: Normocephalic, atraumatic Skin: Warm and dry, cap RF less 2 sec, good turgor Chest:  Normal excursion, shape, no gross abn Respiratory: speaking in full sentences, no conversational dyspnea NeuroM-Sk: Ambulates w/o assistance, moves * 4 Psych: A and O *3, insight good, mood-full  DIAGNOSTIC DATA REVIEWED:  BMET    Component Value Date/Time   NA 141 04/22/2022 1625   K 4.4 04/22/2022 1625   CL 103 04/22/2022 1625   CO2 19 (L)  04/22/2022 1625   GLUCOSE 67 (L) 04/22/2022 1625   GLUCOSE 94 01/23/2022 1051   BUN 6 04/22/2022 1625   CREATININE 0.55 (L) 04/22/2022 1625   CREATININE 0.55 03/21/2016 0802   CALCIUM 8.9 04/22/2022 1625   CALCIUM 9.1 08/04/2012 1652   GFRNONAA >60 10/28/2015 0916   GFRAA >60 10/28/2015 0916   Lab Results  Component Value Date   HGBA1C 5.9 (H) 04/22/2022   HGBA1C 5.8 07/01/2019   Lab Results  Component Value Date   INSULIN 8.7 04/22/2022   Lab Results  Component Value Date   TSH 0.919 04/22/2022   CBC    Component Value Date/Time   WBC 5.8 01/23/2022 1051   RBC 4.30 01/23/2022 1051   HGB 13.2 01/23/2022 1051   HCT 39.6 01/23/2022 1051   PLT 266.0 01/23/2022 1051   MCV 92.1 01/23/2022 1051   MCH 30.4 10/28/2015 0916   MCHC 33.3 01/23/2022 1051   RDW 14.2 01/23/2022 1051   Iron Studies    Component Value Date/Time   IRON 69 07/01/2019 0920   IRONPCTSAT 19.3 (L) 07/01/2019 0920   Lipid Panel     Component Value Date/Time   CHOL 166 04/22/2022 1625   TRIG 115 04/22/2022 1625   HDL 57 04/22/2022 1625   CHOLHDL 3 01/23/2022 1051   VLDL 16.4 01/23/2022 1051   LDLCALC 88 04/22/2022 1625   LDLDIRECT 184.9 08/04/2012 1652   Hepatic Function Panel     Component Value Date/Time   PROT 7.1 04/22/2022 1625   ALBUMIN 4.1 04/22/2022 1625   AST 30 04/22/2022 1625   ALT 25 04/22/2022 1625   ALKPHOS 89 04/22/2022 1625   BILITOT 0.3 04/22/2022 1625   BILIDIR 0.1 01/23/2022 1051   IBILI 0.4 03/21/2016 0802      Component Value Date/Time   TSH 0.919 04/22/2022 1625   Nutritional Lab Results  Component Value Date   VD25OH 36.3 04/22/2022   VD25OH 44.00 01/23/2022   VD25OH 36.82 10/25/2021    Attestations:   Reviewed by clinician on day of visit: allergies, medications, problem list, medical history, surgical history, family history, social history, and previous encounter notes.    I,Special Puri,acting as a Education administrator for [employer], DO.,have documented  all relevant documentation on the behalf of Mellody Dance, DO,as directed by  Mellody Dance, DO while in the presence of Mellody Dance, DO.   I, Mellody Dance, DO, have reviewed all documentation for this visit. The documentation on 12/01/22 for the exam, diagnosis, procedures, and orders are all accurate and complete.

## 2022-12-02 ENCOUNTER — Ambulatory Visit (INDEPENDENT_AMBULATORY_CARE_PROVIDER_SITE_OTHER): Payer: Medicare HMO | Admitting: Family Medicine

## 2022-12-02 LAB — COMPREHENSIVE METABOLIC PANEL WITH GFR
ALT: 31 IU/L (ref 0–32)
AST: 32 IU/L (ref 0–40)
Albumin/Globulin Ratio: 1.6 (ref 1.2–2.2)
Albumin: 4.1 g/dL (ref 3.8–4.9)
Alkaline Phosphatase: 78 IU/L (ref 44–121)
BUN/Creatinine Ratio: 8 — ABNORMAL LOW (ref 9–23)
BUN: 5 mg/dL — ABNORMAL LOW (ref 6–24)
Bilirubin Total: 0.4 mg/dL (ref 0.0–1.2)
CO2: 23 mmol/L (ref 20–29)
Calcium: 9.1 mg/dL (ref 8.7–10.2)
Chloride: 105 mmol/L (ref 96–106)
Creatinine, Ser: 0.64 mg/dL (ref 0.57–1.00)
Globulin, Total: 2.6 g/dL (ref 1.5–4.5)
Glucose: 95 mg/dL (ref 70–99)
Potassium: 3.8 mmol/L (ref 3.5–5.2)
Sodium: 143 mmol/L (ref 134–144)
Total Protein: 6.7 g/dL (ref 6.0–8.5)
eGFR: 103 mL/min/1.73

## 2022-12-02 LAB — TSH: TSH: 0.73 u[IU]/mL (ref 0.450–4.500)

## 2022-12-02 LAB — HEMOGLOBIN A1C
Est. average glucose Bld gHb Est-mCnc: 123 mg/dL
Hgb A1c MFr Bld: 5.9 % — ABNORMAL HIGH (ref 4.8–5.6)

## 2022-12-02 LAB — CBC WITH DIFFERENTIAL/PLATELET
Basophils Absolute: 0 10*3/uL (ref 0.0–0.2)
Basos: 0 %
EOS (ABSOLUTE): 0.1 10*3/uL (ref 0.0–0.4)
Eos: 1 %
Hematocrit: 41.1 % (ref 34.0–46.6)
Hemoglobin: 13.6 g/dL (ref 11.1–15.9)
Immature Grans (Abs): 0 10*3/uL (ref 0.0–0.1)
Immature Granulocytes: 0 %
Lymphocytes Absolute: 2.5 10*3/uL (ref 0.7–3.1)
Lymphs: 44 %
MCH: 30.3 pg (ref 26.6–33.0)
MCHC: 33.1 g/dL (ref 31.5–35.7)
MCV: 92 fL (ref 79–97)
Monocytes Absolute: 0.4 10*3/uL (ref 0.1–0.9)
Monocytes: 6 %
Neutrophils Absolute: 2.7 10*3/uL (ref 1.4–7.0)
Neutrophils: 49 %
Platelets: 230 10*3/uL (ref 150–450)
RBC: 4.49 x10E6/uL (ref 3.77–5.28)
RDW: 13.2 % (ref 11.7–15.4)
WBC: 5.6 10*3/uL (ref 3.4–10.8)

## 2022-12-02 LAB — VITAMIN D 25 HYDROXY (VIT D DEFICIENCY, FRACTURES): Vit D, 25-Hydroxy: 39.3 ng/mL (ref 30.0–100.0)

## 2022-12-02 LAB — LIPID PANEL WITH LDL/HDL RATIO
Cholesterol, Total: 186 mg/dL (ref 100–199)
HDL: 58 mg/dL (ref >39)
LDL Chol Calc (NIH): 110 mg/dL — ABNORMAL HIGH (ref 0–99)
LDL/HDL Ratio: 1.9 ratio (ref 0.0–3.2)
Triglycerides: 99 mg/dL (ref 0–149)
VLDL Cholesterol Cal: 18 mg/dL (ref 5–40)

## 2022-12-02 LAB — VITAMIN B12: Vitamin B-12: 1183 pg/mL (ref 232–1245)

## 2022-12-02 LAB — INSULIN, RANDOM: INSULIN: 10.4 u[IU]/mL (ref 2.6–24.9)

## 2022-12-02 LAB — T4, FREE: Free T4: 1.36 ng/dL (ref 0.82–1.77)

## 2022-12-12 ENCOUNTER — Ambulatory Visit: Payer: Medicare HMO | Admitting: Sports Medicine

## 2022-12-12 DIAGNOSIS — Z1231 Encounter for screening mammogram for malignant neoplasm of breast: Secondary | ICD-10-CM | POA: Diagnosis not present

## 2022-12-29 ENCOUNTER — Ambulatory Visit (INDEPENDENT_AMBULATORY_CARE_PROVIDER_SITE_OTHER): Payer: PRIVATE HEALTH INSURANCE | Admitting: Family Medicine

## 2023-01-23 ENCOUNTER — Ambulatory Visit: Payer: PRIVATE HEALTH INSURANCE | Admitting: Internal Medicine

## 2023-02-13 ENCOUNTER — Ambulatory Visit (INDEPENDENT_AMBULATORY_CARE_PROVIDER_SITE_OTHER)
Admission: RE | Admit: 2023-02-13 | Discharge: 2023-02-13 | Disposition: A | Discharge status: Home / Self Care | Payer: PRIVATE HEALTH INSURANCE | Source: Ambulatory Visit | Attending: Internal Medicine | Admitting: Internal Medicine

## 2023-02-13 DIAGNOSIS — M810 Age-related osteoporosis without current pathological fracture: Secondary | ICD-10-CM

## 2023-02-17 ENCOUNTER — Ambulatory Visit: Payer: PRIVATE HEALTH INSURANCE | Admitting: Internal Medicine

## 2023-03-19 ENCOUNTER — Encounter: Payer: PRIVATE HEALTH INSURANCE | Admitting: Internal Medicine

## 2023-04-06 ENCOUNTER — Ambulatory Visit: Payer: PRIVATE HEALTH INSURANCE | Admitting: Internal Medicine

## 2023-04-06 ENCOUNTER — Encounter: Payer: Self-pay | Admitting: Internal Medicine

## 2023-04-06 VITALS — BP 124/72 | HR 59 | Temp 98.0°F | Ht 64.0 in | Wt 243.0 lb

## 2023-04-06 DIAGNOSIS — E559 Vitamin D deficiency, unspecified: Secondary | ICD-10-CM | POA: Diagnosis not present

## 2023-04-06 DIAGNOSIS — G35 Multiple sclerosis: Secondary | ICD-10-CM | POA: Diagnosis not present

## 2023-04-06 DIAGNOSIS — Z6841 Body Mass Index (BMI) 40.0 and over, adult: Secondary | ICD-10-CM

## 2023-04-06 DIAGNOSIS — R7303 Prediabetes: Secondary | ICD-10-CM | POA: Diagnosis not present

## 2023-04-06 DIAGNOSIS — Z0001 Encounter for general adult medical examination with abnormal findings: Secondary | ICD-10-CM

## 2023-04-06 DIAGNOSIS — E538 Deficiency of other specified B group vitamins: Secondary | ICD-10-CM

## 2023-04-06 DIAGNOSIS — F419 Anxiety disorder, unspecified: Secondary | ICD-10-CM | POA: Diagnosis not present

## 2023-04-06 DIAGNOSIS — E7801 Familial hypercholesterolemia: Secondary | ICD-10-CM

## 2023-04-06 LAB — URINALYSIS, ROUTINE W REFLEX MICROSCOPIC
Bilirubin Urine: NEGATIVE
Hgb urine dipstick: NEGATIVE
Ketones, ur: NEGATIVE
Leukocytes,Ua: NEGATIVE
Nitrite: NEGATIVE
RBC / HPF: NONE SEEN (ref 0–?)
Specific Gravity, Urine: 1.01 (ref 1.000–1.030)
Total Protein, Urine: NEGATIVE
Urine Glucose: NEGATIVE
Urobilinogen, UA: 0.2 (ref 0.0–1.0)
pH: 7 (ref 5.0–8.0)

## 2023-04-06 LAB — BASIC METABOLIC PANEL
BUN: 12 mg/dL (ref 6–23)
CO2: 27 mEq/L (ref 19–32)
Calcium: 9.5 mg/dL (ref 8.4–10.5)
Chloride: 103 mEq/L (ref 96–112)
Creatinine, Ser: 0.63 mg/dL (ref 0.40–1.20)
GFR: 98.27 mL/min (ref >60.00)
Glucose, Bld: 86 mg/dL (ref 70–99)
Potassium: 5.1 mEq/L (ref 3.5–5.1)
Sodium: 137 mEq/L (ref 135–145)

## 2023-04-06 LAB — CBC WITH DIFFERENTIAL/PLATELET
Basophils Absolute: 0.1 10*3/uL (ref 0.0–0.1)
Basophils Relative: 0.8 % (ref 0.0–3.0)
Eosinophils Absolute: 0.2 10*3/uL (ref 0.0–0.7)
Eosinophils Relative: 2.7 % (ref 0.0–5.0)
HCT: 40.3 % (ref 36.0–46.0)
Hemoglobin: 13.1 g/dL (ref 12.0–15.0)
Lymphocytes Relative: 44.4 % (ref 12.0–46.0)
Lymphs Abs: 2.9 10*3/uL (ref 0.7–4.0)
MCHC: 32.5 g/dL (ref 30.0–36.0)
MCV: 93 fl (ref 78.0–100.0)
Monocytes Absolute: 0.4 10*3/uL (ref 0.1–1.0)
Monocytes Relative: 6.9 % (ref 3.0–12.0)
Neutro Abs: 2.9 10*3/uL (ref 1.4–7.7)
Neutrophils Relative %: 45.2 % (ref 43.0–77.0)
Platelets: 233 10*3/uL (ref 150.0–400.0)
RBC: 4.33 Mil/uL (ref 3.87–5.11)
RDW: 13.8 % (ref 11.5–15.5)
WBC: 6.4 10*3/uL (ref 4.0–10.5)

## 2023-04-06 LAB — VITAMIN B12: Vitamin B-12: 751 pg/mL (ref 211–911)

## 2023-04-06 LAB — LIPID PANEL
Cholesterol: 207 mg/dL — ABNORMAL HIGH (ref 0–200)
HDL: 59.4 mg/dL (ref >39.00)
LDL Cholesterol: 129 mg/dL — ABNORMAL HIGH (ref 0–99)
NonHDL: 147.97
Total CHOL/HDL Ratio: 3
Triglycerides: 96 mg/dL (ref 0.0–149.0)
VLDL: 19.2 mg/dL (ref 0.0–40.0)

## 2023-04-06 LAB — HEPATIC FUNCTION PANEL
ALT: 41 U/L — ABNORMAL HIGH (ref 0–35)
AST: 34 U/L (ref 0–37)
Albumin: 4 g/dL (ref 3.5–5.2)
Alkaline Phosphatase: 83 U/L (ref 39–117)
Bilirubin, Direct: 0.1 mg/dL (ref 0.0–0.3)
Total Bilirubin: 0.4 mg/dL (ref 0.2–1.2)
Total Protein: 7.4 g/dL (ref 6.0–8.3)

## 2023-04-06 LAB — HEMOGLOBIN A1C: Hgb A1c MFr Bld: 5.8 % (ref 4.6–6.5)

## 2023-04-06 LAB — VITAMIN D 25 HYDROXY (VIT D DEFICIENCY, FRACTURES): VITD: 43.84 ng/mL (ref 30.00–100.00)

## 2023-04-06 LAB — TSH: TSH: 0.86 u[IU]/mL (ref 0.35–5.50)

## 2023-04-06 MED ORDER — CITALOPRAM HYDROBROMIDE 10 MG PO TABS: 10.0000 mg | ORAL_TABLET | Freq: Every day | ORAL | 3 refills | Status: DC

## 2023-04-06 NOTE — Patient Instructions (Addendum)
Please take all new medication as prescribed - the celexa 10 mg per day; please call in 3-4 wks if you need a higher dose  Please continue all other medications as before, and refills have been done if requested.  Please have the pharmacy call with any other refills you may need.  Please continue your efforts at being more active, low cholesterol diet, and weight control.  You are otherwise up to date with prevention measures today.  Please keep your appointments with your specialists as you may have planned  You will be contacted regarding the referral for: Neurology  Please go to the LAB at the blood drawing area for the tests to be done  You will be contacted by phone if any changes need to be made immediately.  Otherwise, you will receive a letter about your results with an explanation, but please check with MyChart first.  Please make an Appointment to return in 6 months, or sooner if needed

## 2023-04-06 NOTE — Progress Notes (Unsigned)
Patient ID: [name], female   DOB: 1966-02-18, 57 y.o.   MRN: [id number]         Chief Complaint:: wellness exam and anxiety, , grief, MS, hld, low vit d, low b12, preDM       HPI:  [name] is a 57 y.o. female here for wellness exam; up to date                Alsofather died suddently just onver 1 yr ago, still grieving and occaskonal anxiety episodes anxiety  Denies worsening depressive symptoms, suicidal ideation, or panic; has ongoing anxiety,  Did have some grief counseling at the initial time.  Gets a choking feeling with it sometime.  Denies worsening reflux, abd pain, dysphagia, n/v, bowel change or blood.  Pt denies chest pain, increased sob or doe, wheezing, orthopnea, PND, increased LE swelling, palpitations, dizziness or syncope.   Pt denies polydipsia, polyuria, or new focal neuro s/s.    Pt denies fever, wt loss, night sweats, loss of appetite, or other constitutional symptoms  Declines need further counseling.  Does not have neuro f/u for MS, lost to f/u for several years.     Wt Readings from Last 3 Encounters:  04/06/23 243 lb (110.2 kg)  12/01/22 219 lb 12.8 oz (99.7 kg)  11/07/22 234 lb (106.1 kg)   BP Readings from Last 3 Encounters:  04/06/23 124/72  12/01/22 126/89  11/07/22 122/82   Immunization History  Administered Date(s) Administered   Influenza,inj,Quad PF,6+ Mos 07/01/2019, 07/26/2021, 07/25/2022   PFIZER Comirnaty(Gray Top)Covid-19 Tri-Sucrose Vaccine 02/18/2021   PFIZER(Purple Top)SARS-COV-2 Vaccination 11/19/2019, 12/13/2019, 06/18/2020, 07/09/2020   Pfizer Covid-19 Vaccine Bivalent Booster 38yrs & up 07/08/2021   Td 03/27/2010   Tdap 01/25/2021   Unspecified SARS-COV-2 Vaccination 06/02/2022   Zoster Recombinant(Shingrix) 09/16/2021, 11/18/2021   Health Maintenance Due  Topic Date Due   COVID-19 Vaccine (8 - 2023-24 season) 07/28/2022      Past Medical History:  Diagnosis Date   ANXIETY 03/27/2010   BACK PAIN, CHRONIC 03/27/2010    Classic migraine with aura 05/30/2015   COMMON MIGRAINE 03/27/2010   DISC DISEASE, CERVICAL 03/27/2010   FATIGUE 03/27/2010   GERD 03/27/2010   Glaucoma    bilateral- on meds (eye drops)   HYPERLIPIDEMIA 03/27/2010   on meds   Multiple sclerosis (HCC) 10/23/2010   unconfirmed, but suspected   Myocardial infarction (HCC)    Obesity    Osteoporosis 09/24/2012   on meds   Palpitations    Seasonal allergies    Unspecified vitamin D deficiency 10/23/2010   Past Surgical History:  Procedure Laterality Date   ABDOMINAL HYSTERECTOMY     partial-uterus removed   LEFT HEART CATHETERIZATION WITH CORONARY ANGIOGRAM N/A 03/31/2014   Procedure: LEFT HEART CATHETERIZATION WITH CORONARY ANGIOGRAM;  Surgeon: Chrystie Nose, MD;  Location: [facility] CATH LAB;  Service: Cardiovascular;  Laterality: N/A;   Neg stress test  09/08/2005   s/p eye surgury     s/p knee surgury     WISDOM TOOTH EXTRACTION      reports that she has never smoked. She has never used smokeless tobacco. She reports that she does not drink alcohol and does not use drugs. family history includes Arthritis in an other family member; Cancer in her mother and another family member; Colon polyps in her father and mother; Dementia in an other family member; Diabetes in her father and another family member; Heart disease in her father and another family member;  Hyperlipidemia in her father and mother; Hypertension in her father; Kidney disease in her father; Lupus in her cousin and maternal aunt; Thyroid disease in her father. Allergies  Allergen Reactions   Topamax Other (See Comments)    dizziness   Current Outpatient Medications on File Prior to Visit  Medication Sig Dispense Refill   alendronate (FOSAMAX) 70 MG tablet Take 1 tablet (70 mg total) by mouth every 7 (seven) days. Take with a full glass of water on an empty stomach. 13 tablet 3   aspirin 81 MG EC tablet Take 1 tablet (81 mg total) by mouth daily. Swallow whole. 30 tablet  12   brimonidine (ALPHAGAN) 0.2 % ophthalmic solution Place 1 drop into both eyes 3 (three) times daily.     Efinaconazole 10 % SOLN Apply 1 drop topically daily. 4 mL 11   ezetimibe-simvastatin (VYTORIN) 10-40 MG tablet TAKE 1 TABLET BY MOUTH DAILY. 30 tablet 10   timolol (TIMOPTIC) 0.5 % ophthalmic solution 1 drop 2 (two) times daily.     Vitamin D, Ergocalciferol, (DRISDOL) 1.25 MG (50000 UNIT) CAPS capsule Take 1 capsule (50,000 Units total) by mouth every 7 (seven) days. 4 capsule 0   Current Facility-Administered Medications on File Prior to Visit  Medication Dose Route Frequency Provider Last Rate Last Admin   0.9 %  sodium chloride infusion  500 mL Intravenous Once Meryl Dare, MD            ROS:  All others reviewed and negative.  Objective        PE:  BP 124/72 (BP Location: Right Arm, Patient Position: Sitting, Cuff Size: Normal)   Pulse (!) 59   Temp 98 F (36.7 C) (Oral)   Ht 5\' 4"  (1.626 m)   Wt 243 lb (110.2 kg)   SpO2 99%   BMI 41.71 kg/m                 Constitutional: Pt appears in NAD               HENT: Head: NCAT.                Right Ear: External ear normal.                 Left Ear: External ear normal.                Eyes: . Pupils are equal, round, and reactive to light. Conjunctivae and EOM are normal               Nose: without d/c or deformity               Neck: Neck supple. Gross normal ROM               Cardiovascular: Normal rate and regular rhythm.                 Pulmonary/Chest: Effort normal and breath sounds without rales or wheezing.                Abd:  Soft, NT, ND, + BS, no organomegaly               Neurological: Pt is alert. At baseline orientation, motor grossly intact               Skin: Skin is warm. No rashes, no other new lesions, LE edema - none  Psychiatric: Pt behavior is normal without agitation ; depressed anxious affect  Micro: none  Cardiac tracings I have personally interpreted today:   none  Pertinent Radiological findings (summarize): none   Lab Results  Component Value Date   WBC 6.4 04/06/2023   HGB 13.1 04/06/2023   HCT 40.3 04/06/2023   PLT 233.0 04/06/2023   GLUCOSE 86 04/06/2023   CHOL 207 (H) 04/06/2023   TRIG 96.0 04/06/2023   HDL 59.40 04/06/2023   LDLDIRECT 184.9 08/04/2012   LDLCALC 129 (H) 04/06/2023   ALT 41 (H) 04/06/2023   AST 34 04/06/2023   NA 137 04/06/2023   K 5.1 04/06/2023   CL 103 04/06/2023   CREATININE 0.63 04/06/2023   BUN 12 04/06/2023   CO2 27 04/06/2023   TSH 0.86 04/06/2023   INR 0.99 05/24/2015   HGBA1C 5.8 04/06/2023   Assessment/Plan:  [name] is a 57 y.o. Black or African American [2] female with  has a past medical history of ANXIETY (03/27/2010), BACK PAIN, CHRONIC (03/27/2010), Classic migraine with aura (05/30/2015), COMMON MIGRAINE (03/27/2010), DISC DISEASE, CERVICAL (03/27/2010), FATIGUE (03/27/2010), GERD (03/27/2010), Glaucoma, HYPERLIPIDEMIA (03/27/2010), Multiple sclerosis (HCC) (10/23/2010), Myocardial infarction (HCC), Obesity, Osteoporosis (09/24/2012), Palpitations, Seasonal allergies, and Unspecified vitamin D deficiency (10/23/2010).  Multiple sclerosis (HCC) Pt will need ongoing neuro care - for referral  Encounter for well adult exam with abnormal findings Age and sex appropriate education and counseling updated with regular exercise and diet Referrals for preventative services - none needed Immunizations addressed - none needed Smoking counseling  - none needed Evidence for depression or other mood disorder - none significant Most recent labs reviewed. I have personally reviewed and have noted: 1) the patient's medical and social history 2) The patient's current medications and supplements 3) The patient's height, weight, and BMI have been recorded in the chart   Vitamin D deficiency Last vitamin D Lab Results  Component Value Date   VD25OH 43.84 04/06/2023   Stable, cont oral  replacement   Familial hypercholesterolemia Lab Results  Component Value Date   LDLCALC 129 (H) 04/06/2023   Uncontrolled, goal ldl < 70, for start crestor 10 qd   Vitamin B12 deficiency Lab Results  Component Value Date   VITAMINB12 751 04/06/2023   Stable, cont oral replacement - b12 1000 mcg qd   Prediabetes Lab Results  Component Value Date   HGBA1C 5.8 04/06/2023   Stable, pt to continue current medical treatment  - diet, wt control   Anxiety Mild to mod, for celexa 10 every day,  to f/u any worsening symptoms or concerns  Followup: Return in about 6 months (around 10/07/2023).  Oliver Barre, MD 04/07/2023 9:19 PM  [facility]

## 2023-04-07 ENCOUNTER — Encounter: Payer: Self-pay | Admitting: Internal Medicine

## 2023-04-07 ENCOUNTER — Other Ambulatory Visit: Payer: Self-pay | Admitting: Internal Medicine

## 2023-04-07 MED ORDER — ROSUVASTATIN CALCIUM 10 MG PO TABS: 10.0000 mg | ORAL_TABLET | Freq: Every day | ORAL | 3 refills | Status: DC

## 2023-04-07 NOTE — Assessment & Plan Note (Signed)
Lab Results  Component Value Date   LDLCALC 129 (H) 04/06/2023   Uncontrolled, goal ldl < 70, for start crestor 10 qd

## 2023-04-07 NOTE — Assessment & Plan Note (Signed)

## 2023-04-07 NOTE — Assessment & Plan Note (Signed)
Last vitamin D Lab Results  Component Value Date   VD25OH 43.84 04/06/2023   Stable, cont oral replacement

## 2023-04-07 NOTE — Assessment & Plan Note (Signed)
Pt will need ongoing neuro care - for referral

## 2023-04-07 NOTE — Assessment & Plan Note (Signed)
Lab Results  Component Value Date   HGBA1C 5.8 04/06/2023   Stable, pt to continue current medical treatment  - diet, wt control

## 2023-04-07 NOTE — Assessment & Plan Note (Signed)
Lab Results  Component Value Date   VITAMINB12 751 04/06/2023   Stable, cont oral replacement - b12 1000 mcg qd

## 2023-04-07 NOTE — Assessment & Plan Note (Signed)
Mild to mod, for celexa 10 every day,  to f/u any worsening symptoms or concerns

## 2023-05-15 DIAGNOSIS — H31012 Macula scars of posterior pole (postinflammatory) (post-traumatic), left eye: Secondary | ICD-10-CM | POA: Diagnosis not present

## 2023-05-15 DIAGNOSIS — H31092 Other chorioretinal scars, left eye: Secondary | ICD-10-CM | POA: Diagnosis not present

## 2023-05-15 DIAGNOSIS — H33003 Unspecified retinal detachment with retinal break, bilateral: Secondary | ICD-10-CM | POA: Diagnosis not present

## 2023-05-15 DIAGNOSIS — H401130 Primary open-angle glaucoma, bilateral, stage unspecified: Secondary | ICD-10-CM | POA: Diagnosis not present

## 2023-05-15 DIAGNOSIS — S0502XA Injury of conjunctiva and corneal abrasion without foreign body, left eye, initial encounter: Secondary | ICD-10-CM | POA: Diagnosis not present

## 2023-05-18 DIAGNOSIS — S0502XA Injury of conjunctiva and corneal abrasion without foreign body, left eye, initial encounter: Secondary | ICD-10-CM | POA: Diagnosis not present

## 2023-05-18 DIAGNOSIS — H31012 Macula scars of posterior pole (postinflammatory) (post-traumatic), left eye: Secondary | ICD-10-CM | POA: Diagnosis not present

## 2023-05-18 DIAGNOSIS — H401131 Primary open-angle glaucoma, bilateral, mild stage: Secondary | ICD-10-CM | POA: Diagnosis not present

## 2023-05-18 DIAGNOSIS — S0012XA Contusion of left eyelid and periocular area, initial encounter: Secondary | ICD-10-CM | POA: Diagnosis not present

## 2023-05-18 DIAGNOSIS — S0510XD Contusion of eyeball and orbital tissues, unspecified eye, subsequent encounter: Secondary | ICD-10-CM | POA: Diagnosis not present

## 2023-05-18 DIAGNOSIS — S0500XD Injury of conjunctiva and corneal abrasion without foreign body, unspecified eye, subsequent encounter: Secondary | ICD-10-CM | POA: Diagnosis not present

## 2023-05-18 DIAGNOSIS — H33003 Unspecified retinal detachment with retinal break, bilateral: Secondary | ICD-10-CM | POA: Diagnosis not present

## 2023-05-18 DIAGNOSIS — H31092 Other chorioretinal scars, left eye: Secondary | ICD-10-CM | POA: Diagnosis not present

## 2023-05-22 DIAGNOSIS — F419 Anxiety disorder, unspecified: Secondary | ICD-10-CM | POA: Diagnosis not present

## 2023-05-22 DIAGNOSIS — Z01419 Encounter for gynecological examination (general) (routine) without abnormal findings: Secondary | ICD-10-CM | POA: Diagnosis not present

## 2023-05-22 DIAGNOSIS — R208 Other disturbances of skin sensation: Secondary | ICD-10-CM | POA: Diagnosis not present

## 2023-05-22 DIAGNOSIS — N951 Menopausal and female climacteric states: Secondary | ICD-10-CM | POA: Diagnosis not present

## 2023-06-15 ENCOUNTER — Ambulatory Visit (INDEPENDENT_AMBULATORY_CARE_PROVIDER_SITE_OTHER): Payer: PRIVATE HEALTH INSURANCE

## 2023-06-15 ENCOUNTER — Ambulatory Visit (INDEPENDENT_AMBULATORY_CARE_PROVIDER_SITE_OTHER): Payer: PRIVATE HEALTH INSURANCE | Admitting: Internal Medicine

## 2023-06-15 ENCOUNTER — Encounter: Payer: Self-pay | Admitting: Internal Medicine

## 2023-06-15 VITALS — BP 124/80 | HR 67 | Temp 98.3°F | Ht 64.0 in | Wt 235.0 lb

## 2023-06-15 DIAGNOSIS — I878 Other specified disorders of veins: Secondary | ICD-10-CM | POA: Diagnosis not present

## 2023-06-15 DIAGNOSIS — E559 Vitamin D deficiency, unspecified: Secondary | ICD-10-CM

## 2023-06-15 DIAGNOSIS — M25551 Pain in right hip: Secondary | ICD-10-CM | POA: Diagnosis not present

## 2023-06-15 DIAGNOSIS — R7303 Prediabetes: Secondary | ICD-10-CM | POA: Diagnosis not present

## 2023-06-15 DIAGNOSIS — R1031 Right lower quadrant pain: Secondary | ICD-10-CM | POA: Diagnosis not present

## 2023-06-15 DIAGNOSIS — Z23 Encounter for immunization: Secondary | ICD-10-CM | POA: Diagnosis not present

## 2023-06-15 DIAGNOSIS — J309 Allergic rhinitis, unspecified: Secondary | ICD-10-CM | POA: Diagnosis not present

## 2023-06-15 DIAGNOSIS — E538 Deficiency of other specified B group vitamins: Secondary | ICD-10-CM | POA: Diagnosis not present

## 2023-06-15 DIAGNOSIS — M160 Bilateral primary osteoarthritis of hip: Secondary | ICD-10-CM | POA: Diagnosis not present

## 2023-06-15 NOTE — Patient Instructions (Addendum)
Please continue all other medications as before, and refills have been done if requested.  Please have the pharmacy call with any other refills you may need.  Please continue your efforts at being more active, low cholesterol diet, and weight control.  Please keep your appointments with your specialists as you may have planned  You will be contacted regarding the referral for: Orthopedic - [facility]  Please go to the XRAY Department in the first floor for the x-ray testing  You will be contacted by phone if any changes need to be made immediately.  Otherwise, you will receive a letter about your results with an explanation, but please check with MyChart first.  Please make an Appointment to return in Jan 10, or sooner if needed

## 2023-06-15 NOTE — Assessment & Plan Note (Signed)
Lab Results  Component Value Date   HGBA1C 5.8 04/06/2023   Stable, pt to continue current medical treatment  - diet, wt control

## 2023-06-15 NOTE — Assessment & Plan Note (Signed)
Lab Results  Component Value Date   VITAMINB12 751 04/06/2023   Stable, cont oral replacement - b12 1000 mcg qd

## 2023-06-15 NOTE — Assessment & Plan Note (Signed)
Appears to be right hip joint related, possible djd but can't r/o other such as AVN, for plaiin film today, but also refer to Orthopedic given unusual presentation, may need MRI if plains not helpful

## 2023-06-15 NOTE — Assessment & Plan Note (Signed)
Mild to mod, also for otc allegra and/or nasacort asd,,  to f/u any worsening symptoms or concerns

## 2023-06-15 NOTE — Assessment & Plan Note (Signed)
Last vitamin D Lab Results  Component Value Date   VD25OH 43.84 04/06/2023   Stable, cont oral replacement

## 2023-06-15 NOTE — Progress Notes (Signed)
Patient ID: [name], female   DOB: 10-02-1965, 57 y.o.   MRN: [id number]        Chief Complaint: follow up right groin pain, low vit d and b12, preDM       HPI:  [name] is a 57 y.o. female here with c/o 1 mo onset gradually worsening now mod pain to medial right groin area with radiation distally towards the knee but no further, maybe sometimes associated with slight weakness, but no concern for tendency to fall yet.  Pain worse to walk, better to sit.  Pt denies chest pain, increased sob or doe, wheezing, orthopnea, PND, increased LE swelling, palpitations, dizziness or syncope.   Pt denies polydipsia, polyuria, or new focal neuro s/s.    Pt denies fever, wt loss, night sweats, loss of appetite, or other constitutional symptoms  Does have several wks ongoing nasal allergy symptoms with clearish congestion, itch and sneezing, without fever, pain, ST, cough, swelling or wheezing.        Wt Readings from Last 3 Encounters:  06/15/23 235 lb (106.6 kg)  04/06/23 243 lb (110.2 kg)  12/01/22 219 lb 12.8 oz (99.7 kg)   BP Readings from Last 3 Encounters:  06/15/23 124/80  04/06/23 124/72  12/01/22 126/89         Past Medical History:  Diagnosis Date   ANXIETY 03/27/2010   BACK PAIN, CHRONIC 03/27/2010   Classic migraine with aura 05/30/2015   COMMON MIGRAINE 03/27/2010   DISC DISEASE, CERVICAL 03/27/2010   FATIGUE 03/27/2010   GERD 03/27/2010   Glaucoma    bilateral- on meds (eye drops)   HYPERLIPIDEMIA 03/27/2010   on meds   Multiple sclerosis (HCC) 10/23/2010   unconfirmed, but suspected   Myocardial infarction (HCC)    Obesity    Osteoporosis 09/24/2012   on meds   Palpitations    Seasonal allergies    Unspecified vitamin D deficiency 10/23/2010   Past Surgical History:  Procedure Laterality Date   ABDOMINAL HYSTERECTOMY     partial-uterus removed   LEFT HEART CATHETERIZATION WITH CORONARY ANGIOGRAM N/A 03/31/2014   Procedure: LEFT HEART CATHETERIZATION WITH  CORONARY ANGIOGRAM;  Surgeon: Chrystie Nose, MD;  Location: [facility] CATH LAB;  Service: Cardiovascular;  Laterality: N/A;   Neg stress test  09/08/2005   s/p eye surgury     s/p knee surgury     WISDOM TOOTH EXTRACTION      reports that she has never smoked. She has never used smokeless tobacco. She reports that she does not drink alcohol and does not use drugs. family history includes Arthritis in an other family member; Cancer in her mother and another family member; Colon polyps in her father and mother; Dementia in an other family member; Diabetes in her father and another family member; Heart disease in her father and another family member; Hyperlipidemia in her father and mother; Hypertension in her father; Kidney disease in her father; Lupus in her cousin and maternal aunt; Thyroid disease in her father. Allergies  Allergen Reactions   Topamax Other (See Comments)    dizziness   Current Outpatient Medications on File Prior to Visit  Medication Sig Dispense Refill   alendronate (FOSAMAX) 70 MG tablet Take 1 tablet (70 mg total) by mouth every 7 (seven) days. Take with a full glass of water on an empty stomach. 13 tablet 3   aspirin 81 MG EC tablet Take 1 tablet (81 mg total) by mouth daily. Swallow whole. 30  tablet 12   brimonidine (ALPHAGAN) 0.2 % ophthalmic solution Place 1 drop into both eyes 3 (three) times daily.     Efinaconazole 10 % SOLN Apply 1 drop topically daily. 4 mL 11   ezetimibe-simvastatin (VYTORIN) 10-40 MG tablet TAKE 1 TABLET BY MOUTH DAILY. 30 tablet 10   rosuvastatin (CRESTOR) 10 MG tablet Take 1 tablet (10 mg total) by mouth daily. 90 tablet 3   timolol (TIMOPTIC) 0.5 % ophthalmic solution 1 drop 2 (two) times daily.     Vitamin D, Ergocalciferol, (DRISDOL) 1.25 MG (50000 UNIT) CAPS capsule Take 1 capsule (50,000 Units total) by mouth every 7 (seven) days. 4 capsule 0   Current Facility-Administered Medications on File Prior to Visit  Medication Dose Route  Frequency Provider Last Rate Last Admin   0.9 %  sodium chloride infusion  500 mL Intravenous Once Meryl Dare, MD            ROS:  All others reviewed and negative.  Objective        PE:  BP 124/80 (BP Location: Left Arm, Patient Position: Sitting, Cuff Size: Normal)   Pulse 67   Temp 98.3 F (36.8 C) (Oral)   Ht 5\' 4"  (1.626 m)   Wt 235 lb (106.6 kg)   SpO2 98%   BMI 40.34 kg/m                 Constitutional: Pt appears in NAD               HENT: Head: NCAT.                Right Ear: External ear normal.                 Left Ear: External ear normal.                Eyes: . Pupils are equal, round, and reactive to light. Conjunctivae and EOM are normal               Nose: without d/c or deformity               Neck: Neck supple. Gross normal ROM               Cardiovascular: Normal rate and regular rhythm.                 Pulmonary/Chest: Effort normal and breath sounds without rales or wheezing.                Abd:  Soft, NT, ND, + BS, no organomegaly               Neurological: Pt is alert. At baseline orientation, motor grossly intact               Skin: Skin is warm. No rashes, no other new lesions, LE edema - none               Pain on hip flexion and internal and external rotation with reduced ROM               Psychiatric: Pt behavior is normal without agitation   Micro: none  Cardiac tracings I have personally interpreted today:  none  Pertinent Radiological findings (summarize): none   Lab Results  Component Value Date   WBC 6.4 04/06/2023   HGB 13.1 04/06/2023   HCT 40.3 04/06/2023   PLT 233.0 04/06/2023   GLUCOSE 86 04/06/2023  CHOL 207 (H) 04/06/2023   TRIG 96.0 04/06/2023   HDL 59.40 04/06/2023   LDLDIRECT 184.9 08/04/2012   LDLCALC 129 (H) 04/06/2023   ALT 41 (H) 04/06/2023   AST 34 04/06/2023   NA 137 04/06/2023   K 5.1 04/06/2023   CL 103 04/06/2023   CREATININE 0.63 04/06/2023   BUN 12 04/06/2023   CO2 27 04/06/2023   TSH 0.86  04/06/2023   INR 0.99 05/24/2015   HGBA1C 5.8 04/06/2023   Assessment/Plan:  [name] is a 57 y.o. Black or African American [2] female with  has a past medical history of ANXIETY (03/27/2010), BACK PAIN, CHRONIC (03/27/2010), Classic migraine with aura (05/30/2015), COMMON MIGRAINE (03/27/2010), DISC DISEASE, CERVICAL (03/27/2010), FATIGUE (03/27/2010), GERD (03/27/2010), Glaucoma, HYPERLIPIDEMIA (03/27/2010), Multiple sclerosis (HCC) (10/23/2010), Myocardial infarction (HCC), Obesity, Osteoporosis (09/24/2012), Palpitations, Seasonal allergies, and Unspecified vitamin D deficiency (10/23/2010).  Vitamin D deficiency Last vitamin D Lab Results  Component Value Date   VD25OH 43.84 04/06/2023   Stable, cont oral replacement   Vitamin B12 deficiency Lab Results  Component Value Date   VITAMINB12 751 04/06/2023   Stable, cont oral replacement - b12 1000 mcg qd   Prediabetes Lab Results  Component Value Date   HGBA1C 5.8 04/06/2023   Stable, pt to continue current medical treatment  - diet, wt control   Right hip pain Appears to be right hip joint related, possible djd but can't r/o other such as AVN, for plaiin film today, but also refer to Orthopedic given unusual presentation, may need MRI if plains not helpful  Allergic rhinitis Mild to mod, also for otc allegra and/or nasacort asd,,  to f/u any worsening symptoms or concerns  Followup: Return in about 3 months (around 09/18/2023), or if symptoms worsen or fail to improve.  Oliver Barre, MD 06/15/2023 7:58 PM [facility]

## 2023-06-17 DIAGNOSIS — H5213 Myopia, bilateral: Secondary | ICD-10-CM | POA: Diagnosis not present

## 2023-06-17 DIAGNOSIS — H31003 Unspecified chorioretinal scars, bilateral: Secondary | ICD-10-CM | POA: Diagnosis not present

## 2023-06-17 DIAGNOSIS — H401132 Primary open-angle glaucoma, bilateral, moderate stage: Secondary | ICD-10-CM | POA: Diagnosis not present

## 2023-06-19 DIAGNOSIS — M25551 Pain in right hip: Secondary | ICD-10-CM | POA: Diagnosis not present

## 2023-06-29 DIAGNOSIS — Z0289 Encounter for other administrative examinations: Secondary | ICD-10-CM

## 2023-06-30 ENCOUNTER — Other Ambulatory Visit: Payer: Self-pay | Admitting: Adult Health

## 2023-07-13 ENCOUNTER — Ambulatory Visit (INDEPENDENT_AMBULATORY_CARE_PROVIDER_SITE_OTHER): Payer: Medicare HMO | Admitting: Adult Health

## 2023-07-13 ENCOUNTER — Encounter (INDEPENDENT_AMBULATORY_CARE_PROVIDER_SITE_OTHER): Payer: Self-pay | Admitting: Adult Health

## 2023-07-13 VITALS — BP 117/76 | HR 70 | Temp 97.8°F | Ht 64.0 in | Wt 234.0 lb

## 2023-07-13 DIAGNOSIS — Z1331 Encounter for screening for depression: Secondary | ICD-10-CM | POA: Diagnosis not present

## 2023-07-13 DIAGNOSIS — F430 Acute stress reaction: Secondary | ICD-10-CM | POA: Diagnosis not present

## 2023-07-13 DIAGNOSIS — R7303 Prediabetes: Secondary | ICD-10-CM | POA: Diagnosis not present

## 2023-07-13 DIAGNOSIS — E7800 Pure hypercholesterolemia, unspecified: Secondary | ICD-10-CM

## 2023-07-13 DIAGNOSIS — E559 Vitamin D deficiency, unspecified: Secondary | ICD-10-CM

## 2023-07-13 DIAGNOSIS — R0602 Shortness of breath: Secondary | ICD-10-CM

## 2023-07-13 DIAGNOSIS — Z6841 Body Mass Index (BMI) 40.0 and over, adult: Secondary | ICD-10-CM | POA: Diagnosis not present

## 2023-07-13 DIAGNOSIS — E538 Deficiency of other specified B group vitamins: Secondary | ICD-10-CM

## 2023-07-13 MED ORDER — VITAMIN D (ERGOCALCIFEROL) 1.25 MG (50000 UNIT) PO CAPS: 50000.0000 [IU] | ORAL_CAPSULE | ORAL | 0 refills | Status: DC

## 2023-07-13 NOTE — Progress Notes (Signed)
[facility] MED[facility] WEIGHT [place] [facility] AT [place] [address] [place] [address] Dept: [phone] Dept Fax: [phone]  At a Glance:  Vitals Temp: 97.8 F (36.6 C) BP: 117/76 Pulse Rate: 70 SpO2: 98 %   Anthropometric Measurements Height: 5\' 4"  (1.626 m) Weight: 234 lb (106.1 kg) BMI (Calculated): 40.15 Weight at Last Visit: 0 Weight Lost Since Last Visit: 0 Weight Gained Since Last Visit: 0 Starting Weight: 234lb (restart) Total Weight Loss (lbs): 0 lb (0 kg) Peak Weight: 234lb   Body Composition  Body Fat %: 51.5 % Fat Mass (lbs): 120.6 lbs Muscle Mass (lbs): 107.8 lbs Total Body Water (lbs): 79.6 lbs Visceral Fat Rating : 16   Other Clinical Data Fasting: yes Labs: no Today's Visit #: 1 Starting Date: 07/13/23 Comments: Restarting today     Indirect Calorimeter completed today shows a VO2 of   04/22/22 08:00  RMR 1310   Metabolism SLOWER than expected when checked in August 2023  Chief Complaint:  Obesity: Last in office visit at [facility] on 12/01/2022 [facility] Re-Start today Weight 234 lbs with corresponding BMI 40.2  Subjective:  [name] (MR# [id number]) is a 57 y.o. female who presents for evaluation and treatment of obesity and related comorbidities.   Amaal is currently in the action stage of change and ready to dedicate time achieving and maintaining a healthier weight. [name] is interested in becoming our patient and working on intensive lifestyle modifications including (but not limited to) diet and exercise for weight loss.  [name] has been struggling with her weight. She has been unsuccessful in either losing weight, maintaining weight loss, or reaching her healthy weight goal.  [name]'s habits were reviewed today and are as follows: she started gaining weight Spring/Summer 2024, she snacks frequently in the evenings, and she frequently makes poor food choices.  Other Fatigue [name]  admits to daytime somnolence and admits to waking up still tired. Patient has a history of symptoms of daytime fatigue and morning fatigue. [name] generally gets 4 hours of sleep per night, and states that she has nightime awakenings. Snoring is present. Apneic episodes are not present.  Her husband works FT dayshift position and PR[employer] nightshift Corporate investment banker. She will often experience interrupted sleep when he comes in late from work.  Shortness of Breath Miyu notes increasing shortness of breath with exercising and seems to be worsening over time with weight gain. She notes getting out of breath sooner with activity than she used to. This has not gotten worse recently. [name] denies shortness of breath at rest or orthopnea.   Depression Screen [name]'s Food and Mood (modified PHQ-9) score was 0.     06/15/2023    3:01 PM  Depression screen PHQ 2/9  Decreased Interest 0  Down, Depressed, Hopeless 0  PHQ - 2 Score 0    1. Hypercholesterolemia Discussed Labs Lipid Panel     Component Value Date/Time   CHOL 207 (H) 04/06/2023 1413   CHOL 186 12/01/2022 0948   TRIG 96.0 04/06/2023 1413   HDL 59.40 04/06/2023 1413   HDL 58 12/01/2022 0948   CHOLHDL 3 04/06/2023 1413   VLDL 19.2 04/06/2023 1413   LDLCALC 129 (H) 04/06/2023 1413   LDLCALC 110 (H) 12/01/2022 0948   LDLDIRECT 184.9 08/04/2012 1652   LABVLDL 18 12/01/2022 0948   The 10-year ASCVD risk score (Arnett DK, et al., 2019) is: 2.9%   Values used to calculate the score:  Age: 1 years     Sex: Female     Is Non-Hispanic African American: Yes     Diabetic: No     Tobacco smoker: No     Systolic Blood Pressure: 117 mmHg     Is BP treated: No     HDL Cholesterol: 59.4 mg/dL     Total Cholesterol: 207 mg/dL   Total and LDL levels both worsening Has been on Vytorin 10/40mg  daily for years PCP added daily Crestor 10mg  on/about 04/07/2023 (LDL >goal 70)  2. Pre-diabetes Discussed Labs Lab Results  Component Value  Date   HGBA1C 5.8 04/06/2023   HGBA1C 5.9 (H) 12/01/2022   HGBA1C 5.9 (H) 04/22/2022     Latest Reference Range & Units 04/22/22 16:25 12/01/22 09:48  INSULIN 2.6 - 24.9 uIU/mL 8.7 10.4   A1c and Insulin level both improved  3. Vitamin D deficiency Discussed Labs  Latest Reference Range & Units 04/06/23 14:13  VITD 30.00 - 100.00 ng/mL 43.84   Below goal of 50-70 She has been off Ergocaciferol for months- lost to f/u at [facility]   4. Vitamin B12 deficiency Discussed Labs  Latest Reference Range & Units 12/01/22 09:48 04/06/23 14:13  Vitamin B12 211 - 911 pg/mL 1,183 751   Stable, cont oral replacement - b12 1000 mcg qd   Assessment and Plan:   Other Fatigue [name] does feel that her weight is causing her energy to be lower than it should be. Fatigue may be related to obesity, depression or many other causes. Labs will be ordered, and in the meanwhile, [name] will focus on self care including making healthy food choices, increasing physical activity and focusing on stress reduction.  Shortness of Breath [name] does not feel that she gets out of breath more easily that she used to when she exercises. [name]'s shortness of breath appears to be obesity related and exercise induced. She has agreed to work on weight loss and gradually increase exercise to treat her exercise induced shortness of breath. Will continue to monitor closely.  1. Hypercholesterolemia Continue daily Vytorin and newly added statin therapy F/U with PCP Jan 2025  2. Pre-diabetes Restart Cat 1 Meal plan- handouts provided  3. Vitamin D deficiency Restart Vitamin D, Ergocalciferol, (DRISDOL) 1.25 MG (50000 UNIT) CAPS capsule Take 1 capsule (50,000 Units total) by mouth every 7 (seven) days. Dispense: 4 capsule, Refills: 0 ordered   4. Vitamin B12 deficiency Continue oral replacement - b12 1000 mcg qd   6. Morbid obesity (HCC)-RESTART DATE 07/13/2023 BMI 40.15  [name] is not currently in the action stage of  change and her goal is to get back to weightloss efforts . I recommend [name] begin the structured treatment plan as follows:  She has agreed to Category 1 Plan  Exercise goals: All adults should avoid inactivity. Some activity is better than none, and adults who participate in any amount of physical activity, gain some health benefits. and Adults should also include muscle-strengthening activities that involve all major muscle groups on 2 or more days a week.  Behavioral modification strategies:increasing lean protein intake, decreasing simple carbohydrates, increasing vegetables, decreasing eating out, meal planning and cooking strategies, keeping healthy foods in the home, ways to avoid boredom eating, travel eating strategies, and planning for success  She was informed of the importance of frequent follow-up visits to maximize her success with intensive lifestyle modifications for her multiple health conditions. She was informed we would discuss her lab results at her next visit unless there  is a critical issue that needs to be addressed sooner. [name] agreed to keep her next visit at the agreed upon time to discuss these results.  Objective:  General: Cooperative, alert, well developed, in no acute distress. HEENT: Conjunctivae and lids unremarkable. Cardiovascular: Regular rhythm.  Lungs: Normal work of breathing. Neurologic: No focal deficits.   Lab Results  Component Value Date   CREATININE 0.63 04/06/2023   BUN 12 04/06/2023   NA 137 04/06/2023   K 5.1 04/06/2023   CL 103 04/06/2023   CO2 27 04/06/2023   Lab Results  Component Value Date   ALT 41 (H) 04/06/2023   AST 34 04/06/2023   ALKPHOS 83 04/06/2023   BILITOT 0.4 04/06/2023   Lab Results  Component Value Date   HGBA1C 5.8 04/06/2023   HGBA1C 5.9 (H) 12/01/2022   HGBA1C 5.9 (H) 04/22/2022   HGBA1C 6.0 01/23/2022   HGBA1C 5.8 07/26/2021   Lab Results  Component Value Date   INSULIN 10.4 12/01/2022   INSULIN  8.7 04/22/2022   Lab Results  Component Value Date   TSH 0.86 04/06/2023   Lab Results  Component Value Date   CHOL 207 (H) 04/06/2023   HDL 59.40 04/06/2023   LDLCALC 129 (H) 04/06/2023   LDLDIRECT 184.9 08/04/2012   TRIG 96.0 04/06/2023   CHOLHDL 3 04/06/2023   Lab Results  Component Value Date   WBC 6.4 04/06/2023   HGB 13.1 04/06/2023   HCT 40.3 04/06/2023   MCV 93.0 04/06/2023   PLT 233.0 04/06/2023   Lab Results  Component Value Date   IRON 69 07/01/2019    Attestation Statements:  Reviewed by clinician on day of visit: allergies, medications, problem list, medical history, surgical history, family history, social history, and previous encounter notes.  Time spent on visit including pre-visit chart review and post-visit charting and care was 32 minutes.   Julaine Fusi, NP

## 2023-07-24 DIAGNOSIS — M25551 Pain in right hip: Secondary | ICD-10-CM | POA: Diagnosis not present

## 2023-08-01 ENCOUNTER — Other Ambulatory Visit: Payer: Self-pay | Admitting: Adult Health

## 2023-08-03 ENCOUNTER — Ambulatory Visit (INDEPENDENT_AMBULATORY_CARE_PROVIDER_SITE_OTHER): Payer: Medicare HMO | Admitting: Adult Health

## 2023-08-03 ENCOUNTER — Encounter (INDEPENDENT_AMBULATORY_CARE_PROVIDER_SITE_OTHER): Payer: Self-pay | Admitting: Adult Health

## 2023-08-03 VITALS — BP 111/77 | HR 66 | Temp 98.0°F | Ht 64.0 in | Wt 228.0 lb

## 2023-08-03 DIAGNOSIS — M25551 Pain in right hip: Secondary | ICD-10-CM | POA: Diagnosis not present

## 2023-08-03 DIAGNOSIS — E559 Vitamin D deficiency, unspecified: Secondary | ICD-10-CM | POA: Diagnosis not present

## 2023-08-03 DIAGNOSIS — E669 Obesity, unspecified: Secondary | ICD-10-CM | POA: Diagnosis not present

## 2023-08-03 DIAGNOSIS — E66812 Obesity, class 2: Secondary | ICD-10-CM

## 2023-08-03 DIAGNOSIS — E7800 Pure hypercholesterolemia, unspecified: Secondary | ICD-10-CM | POA: Diagnosis not present

## 2023-08-03 DIAGNOSIS — Z6839 Body mass index (BMI) 39.0-39.9, adult: Secondary | ICD-10-CM

## 2023-08-03 DIAGNOSIS — R7303 Prediabetes: Secondary | ICD-10-CM | POA: Diagnosis not present

## 2023-08-03 MED ORDER — VITAMIN D (ERGOCALCIFEROL) 1.25 MG (50000 UNIT) PO CAPS: 50000.0000 [IU] | ORAL_CAPSULE | ORAL | 0 refills | Status: DC

## 2023-08-03 NOTE — Progress Notes (Signed)
WEIGHT SUMMARY AND BIOMETRICS  Vitals Temp: 98 F (36.7 C) BP: 111/77 Pulse Rate: 66 SpO2: 99 %   Anthropometric Measurements Height: 5\' 4"  (1.626 m) Weight: 228 lb (103.4 kg) BMI (Calculated): 39.12 Weight at Last Visit: 234lb Weight Lost Since Last Visit: 6lb Weight Gained Since Last Visit: 0 Starting Weight: 234lb Total Weight Loss (lbs): 6 lb (2.722 kg) Peak Weight: 234lb   Body Composition  Body Fat %: 51.1 % Fat Mass (lbs): 116.6 lbs Muscle Mass (lbs): 105.8 lbs Total Body Water (lbs): 79.6 lbs Visceral Fat Rating : 16   Other Clinical Data Fasting: no Labs: no Today's Visit #: 2 Starting Date: 07/13/23    Chief Complaint:   OBESITY [name] is here to discuss her progress with her obesity treatment plan. She is on the the Category 1 Plan and states she is following her eating plan approximately 95 % of the time.  She states she is exercising NEAT activities   Interim History:  Ms. Kusler is caring for her 58 year old mother. She lives alone and is legally blind. Ms. Shure will often visit her mother several times a day and her mother often sleeps over with her. Her 58 year old father passed away from complications of Alzheimer's in March 2022   Ultimate plan is to sell her mother's home and her home to either build or purchase a home for all three to live together (mother, Mrs. Cantrelle and her husband)  Subjective:   1. Right hip pain She reports spontaneous R hip pain that develop August 2024 He denies acute injury/accident prior to onset of pain Pain described as throbbing and rated today at 7/10  2. Hypercholesterolemia She started daily Crestor 10mg  and has remained on daily Vytorin 10/40mg  She endorses new onset of calf soreness the last several weeks.  3. Pre-diabetes Lab Results  Component Value Date   HGBA1C 5.8 04/06/2023   HGBA1C 5.9 (H) 12/01/2022   HGBA1C 5.9 (H) 04/22/2022    She is not currently on any antidiabetic  therapy She denies polyphagia  4. Vitamin D deficiency  Latest Reference Range & Units 04/06/23 14:13  VITD 30.00 - 100.00 ng/mL 43.84   She is on weekly Ergocalciferol  Assessment/Plan:   1. Right hip pain Start Prednisone as directed Continue PRN Meloxicam- do not use other NSAIDs when using Meloxicam  2. Hypercholesterolemia Continue Cat 1 Meal Plan  3. Pre-diabetes Continue Cat 1 Meal Plan  6. Vitamin D deficiency Refill - Vitamin D, Ergocalciferol, (DRISDOL) 1.25 MG (50000 UNIT) CAPS capsule; Take 1 capsule (50,000 Units total) by mouth every 7 (seven) days.  Dispense: 4 capsule; Refill: 0  4. Obesity,current BMI 39.12  [name] is currently in the action stage of change. As such, her goal is to continue with weight loss efforts. She has agreed to the Category 1 Plan.   Exercise goals: All adults should avoid inactivity. Some physical activity is better than none, and adults who participate in any amount of physical activity gain some health benefits.  Behavioral modification strategies: increasing lean protein intake, decreasing simple carbohydrates, increasing vegetables, increasing water intake, meal planning and cooking strategies, keeping healthy foods in the home, better snacking choices, and planning for success.  Noble has agreed to follow-up with our clinic in 4 weeks. She was informed of the importance of frequent follow-up visits to maximize her success with intensive lifestyle modifications for her multiple health conditions.   Check Fasting Labs at next OV  Objective:   Blood pressure 111/77, pulse 66, temperature 98 F (36.7 C), height 5\' 4"  (1.626 m), weight 228 lb (103.4 kg), SpO2 99%. Body mass index is 39.14 kg/m.  General: Cooperative, alert, well developed, in no acute distress. HEENT: Conjunctivae and lids unremarkable. Cardiovascular: Regular rhythm.  Lungs: Normal work of breathing. Neurologic: No focal deficits.   Lab Results  Component  Value Date   CREATININE 0.63 04/06/2023   BUN 12 04/06/2023   NA 137 04/06/2023   K 5.1 04/06/2023   CL 103 04/06/2023   CO2 27 04/06/2023   Lab Results  Component Value Date   ALT 41 (H) 04/06/2023   AST 34 04/06/2023   ALKPHOS 83 04/06/2023   BILITOT 0.4 04/06/2023   Lab Results  Component Value Date   HGBA1C 5.8 04/06/2023   HGBA1C 5.9 (H) 12/01/2022   HGBA1C 5.9 (H) 04/22/2022   HGBA1C 6.0 01/23/2022   HGBA1C 5.8 07/26/2021   Lab Results  Component Value Date   INSULIN 10.4 12/01/2022   INSULIN 8.7 04/22/2022   Lab Results  Component Value Date   TSH 0.86 04/06/2023   Lab Results  Component Value Date   CHOL 207 (H) 04/06/2023   HDL 59.40 04/06/2023   LDLCALC 129 (H) 04/06/2023   LDLDIRECT 184.9 08/04/2012   TRIG 96.0 04/06/2023   CHOLHDL 3 04/06/2023   Lab Results  Component Value Date   VD25OH 43.84 04/06/2023   VD25OH 39.3 12/01/2022   VD25OH 36.3 04/22/2022   Lab Results  Component Value Date   WBC 6.4 04/06/2023   HGB 13.1 04/06/2023   HCT 40.3 04/06/2023   MCV 93.0 04/06/2023   PLT 233.0 04/06/2023   Lab Results  Component Value Date   IRON 69 07/01/2019   Attestation Statements:   Reviewed by clinician on day of visit: allergies, medications, problem list, medical history, surgical history, family history, social history, and previous encounter notes.  I have reviewed the above documentation for accuracy and completeness, and I agree with the above. -  Trayden Brandy d. Deon Ivey, NP-C

## 2023-08-03 NOTE — Progress Notes (Signed)
Duplicate

## 2023-08-04 NOTE — Telephone Encounter (Signed)
Left message for patient to call and make appointment for refills.

## 2023-08-20 ENCOUNTER — Emergency Department (HOSPITAL_COMMUNITY): Payer: PRIVATE HEALTH INSURANCE

## 2023-08-20 ENCOUNTER — Emergency Department (HOSPITAL_COMMUNITY)
Admission: EM | Admit: 2023-08-20 | Discharge: 2023-08-20 | Disposition: A | Discharge status: Home / Self Care | Payer: PRIVATE HEALTH INSURANCE | Attending: Emergency Medicine | Admitting: Emergency Medicine

## 2023-08-20 ENCOUNTER — Other Ambulatory Visit: Payer: Self-pay

## 2023-08-20 ENCOUNTER — Encounter (HOSPITAL_COMMUNITY): Payer: Self-pay

## 2023-08-20 DIAGNOSIS — R519 Headache, unspecified: Secondary | ICD-10-CM | POA: Insufficient documentation

## 2023-08-20 DIAGNOSIS — H9209 Otalgia, unspecified ear: Secondary | ICD-10-CM | POA: Diagnosis not present

## 2023-08-20 DIAGNOSIS — Z7982 Long term (current) use of aspirin: Secondary | ICD-10-CM | POA: Insufficient documentation

## 2023-08-20 DIAGNOSIS — R42 Dizziness and giddiness: Secondary | ICD-10-CM | POA: Insufficient documentation

## 2023-08-20 LAB — CBC
HCT: 38.5 % (ref 36.0–46.0)
Hemoglobin: 12.8 g/dL (ref 12.0–15.0)
MCH: 31.2 pg (ref 26.0–34.0)
MCHC: 33.2 g/dL (ref 30.0–36.0)
MCV: 93.9 fL (ref 80.0–100.0)
Platelets: 253 10*3/uL (ref 150–400)
RBC: 4.1 MIL/uL (ref 3.87–5.11)
RDW: 14.5 % (ref 11.5–15.5)
WBC: 6.1 10*3/uL (ref 4.0–10.5)
nRBC: 0 % (ref 0.0–0.2)

## 2023-08-20 LAB — BASIC METABOLIC PANEL
Anion gap: 6 (ref 5–15)
BUN: 13 mg/dL (ref 6–20)
CO2: 25 mmol/L (ref 22–32)
Calcium: 8.8 mg/dL — ABNORMAL LOW (ref 8.9–10.3)
Chloride: 106 mmol/L (ref 98–111)
Creatinine, Ser: 0.49 mg/dL (ref 0.44–1.00)
GFR, Estimated: >60 mL/min (ref >60)
Glucose, Bld: 96 mg/dL (ref 70–99)
Potassium: 3.8 mmol/L (ref 3.5–5.1)
Sodium: 137 mmol/L (ref 135–145)

## 2023-08-20 MED ORDER — METOCLOPRAMIDE HCL 5 MG/ML IJ SOLN
10.0000 mg | Freq: Once | INTRAMUSCULAR | Status: AC
  Administered 2023-08-20: 10 mg via INTRAVENOUS
  Filled 2023-08-20: qty 2

## 2023-08-20 MED ORDER — DIPHENHYDRAMINE HCL 50 MG/ML IJ SOLN
25.0000 mg | Freq: Once | INTRAMUSCULAR | Status: AC
  Administered 2023-08-20: 25 mg via INTRAVENOUS
  Filled 2023-08-20: qty 1

## 2023-08-20 NOTE — Discharge Instructions (Signed)
You were seen in the emergency room today for headache.  I would recommend following up with primary care.  Return to emergency room if you have any new or worsening symptoms.

## 2023-08-20 NOTE — ED Provider Notes (Signed)
[place] EMERGENCY DEPARTMENT AT [facility] Provider Note   CSN: [id number] Arrival date & time: 08/20/23  0744     History  Chief Complaint  Patient presents with   Headache   Otalgia    [name] is a 57 y.o. female patient with past medical history of hyperlipidemia, GERD, multiple sclerosis, migraine presenting to emergency room with left sided headache.  Patient reports has been ongoing, intermittent for 5 days.  Patient reports headache responds to medications at home however it has been intermittent all week causing nausea, sensitivity to light and dizziness.  Patient reports this feels different than her classic migraine.  Denies any focal weakness, chest pain, URI-like symptoms, shortness of breath abdominal pain, vomiting or diarrhea.   Headache Associated symptoms: ear pain   Otalgia Associated symptoms: headaches        Home Medications Prior to Admission medications   Medication Sig Start Date End Date Taking? Authorizing Provider  alendronate (FOSAMAX) 70 MG tablet Take 1 tablet (70 mg total) by mouth every 7 (seven) days. Take with a full glass of water on an empty stomach. 11/05/22   Shamleffer, Konrad Dolores, MD  aspirin 81 MG EC tablet Take 1 tablet (81 mg total) by mouth daily. Swallow whole. 01/07/17   Corwin Levins, MD  brimonidine (ALPHAGAN) 0.2 % ophthalmic solution Place 1 drop into both eyes 3 (three) times daily. 07/28/22   [provider]  Efinaconazole 10 % SOLN Apply 1 drop topically daily. 05/23/22   Vivi Barrack, DPM  ezetimibe-simvastatin (VYTORIN) 10-40 MG tablet TAKE 1 TABLET BY MOUTH EVERY DAY 06/30/23   Jodelle Gross, NP  rosuvastatin (CRESTOR) 10 MG tablet Take 1 tablet (10 mg total) by mouth daily. 04/07/23   Corwin Levins, MD  timolol (TIMOPTIC) 0.5 % ophthalmic solution 1 drop 2 (two) times daily. 06/05/21   [provider]  Vitamin D, Ergocalciferol, (DRISDOL) 1.25 MG (50000 UNIT) CAPS capsule  Take 1 capsule (50,000 Units total) by mouth every 7 (seven) days. 08/03/23   Danford, Jinny Blossom, NP      Allergies    Topamax    Review of Systems   Review of Systems  HENT:  Positive for ear pain.   Neurological:  Positive for headaches.    Physical Exam Updated Vital Signs BP 132/81 (BP Location: Left Arm)   Pulse 79   Temp 98 F (36.7 C) (Oral)   Resp 18   SpO2 100%  Physical Exam Vitals and nursing note reviewed.  Constitutional:      General: She is not in acute distress.    Appearance: She is not toxic-appearing.  HENT:     Head: Normocephalic and atraumatic.     Right Ear: Tympanic membrane, ear canal and external ear normal. There is no impacted cerumen.     Left Ear: Tympanic membrane, ear canal and external ear normal. There is no impacted cerumen.     Nose: No congestion or rhinorrhea.  Eyes:     General: No scleral icterus.    Conjunctiva/sclera: Conjunctivae normal.     Comments: Right pupil dilated, patient reports from prior glaucoma surgery.  No nystagmus.  Cardiovascular:     Rate and Rhythm: Normal rate and regular rhythm.     Pulses: Normal pulses.     Heart sounds: Normal heart sounds.  Pulmonary:     Effort: Pulmonary effort is normal. No respiratory distress.     Breath sounds: Normal breath sounds.  Abdominal:     General: Abdomen is flat. Bowel sounds are normal.     Palpations: Abdomen is soft.     Tenderness: There is no abdominal tenderness.  Skin:    General: Skin is warm and dry.     Findings: No lesion.  Neurological:     General: No focal deficit present.     Mental Status: She is alert and oriented to person, place, and time. Mental status is at baseline.     Comments: Neuroexam without deficit.  Strength equal bilaterally, no ataxia in no sensory changes.  Patient alert and oriented answering questions appropriately with no slurred speech.     ED Results / Procedures / Treatments   Labs (all labs ordered are listed, but only  abnormal results are displayed) Labs Reviewed - No data to display  EKG None  Radiology No results found.  Procedures Procedures    Medications Ordered in ED Medications - No data to display  ED Course/ Medical Decision Making/ A&P Clinical Course as of 08/20/23 1135  Thu Aug 20, 2023  0955 After getting Benadryl and Reglan patient reports feeling palpitations, jittery and uncomfortable.  Patient reports she feels very anxious.  Denies any throat swelling or itching.  No sign of respiratory distress.  Lungs clear bilaterally to auscultation.  Patient declines medication, will closely reassess [JB]    Clinical Course User Index [JB] Tonishia Steffy, Horald Chestnut, PA-C                                 Medical Decision Making Amount and/or Complexity of Data Reviewed Labs: ordered. Radiology: ordered.  Risk Prescription drug management.   [name] 57 y.o. presented today for HA. Working Ddx: tension headache, migraine, intracranial mass, intracranial hemorrhage, intracranial infection including meningitis vs encephalitis, trigeminal neuralgia, AVM, sinusitis, cerebral aneurysm, muscular headache, cavernous sinus thrombosis, carotid artery dissection.  R/o DDx: intracranial mass, hemorrhage,or infection including meningitis vs encephalitis, trigeminal neuralgia, AVM, cerebral aneurysm, muscular headache, cavernous sinus thrombosis, carotid artery dissection are less likely due to history of present illness, physical exam, labs/imaging findings  PMHX: migraines, MS  Review of prior external notes: 08/03/23  Unique Tests and My Interpretation:  CBC: No leukocytosis, no anemia CMP: No electrolyte abnormality, normal anion gap, no AKI  CT Head w/o Contrast: No acute abnormality   Problem List / ED Course / Critical interventions / Medication management  Reports to emergency room with headache and dizziness.  Patient has no focal neurological deficits on exam.  Patient is alert and  oriented answering questions appropriately.  Patient's symptoms improved after Reglan and Benadryl.  Patient had negative head CT. Patient hemodynamically stable and well appearing. Answering questions appropriately with no slurred speech.  Requesting discharge. Patients dizziness is intermittent and not associated with neurodeficit nor ataxia thus I suspect that this is peripheral vertigo. Not currently complaining of vertigo.  When reassessed prior to discharge.  Patient reports her symptoms had significantly improved including the jitteriness she had after Reglan.  Patient's son in room, explained diagnosis and treatment plan. All questions answered.  I ordered medication including Reglan, Benadryl  for HA -offered Toradol after head CT however patient does not want any more medications and requesting discharge.  Given return precautions. Reevaluation of the patient after these medicines showed that the patient improved Patients vitals assessed. Upon arrival patient is hemodynamically stable.  I have reviewed the patients home medicines  and have made adjustments as needed     Plan:   F/u w/ PCP in 2-3d to ensure resolution of sx.  Patient was given return precautions. Patient stable for discharge at this time.  Patient educated on sx/dx and verbalized understanding of plan. Return to ED if new or worsening sx.           Final Clinical Impression(s) / ED Diagnoses Final diagnoses:  Bad headache    Rx / DC Orders ED Discharge Orders     None         Smitty Knudsen, PA-C 08/20/23 1142    Royanne Foots, DO 08/23/23 2036

## 2023-08-20 NOTE — ED Triage Notes (Signed)
Pt presents with c/o pain on the left side of her head and associated left ear pain since Sunday of this week. Pt reports she has been taking pain reliever at home with minimal relief. Pt reports the pain has been intermittent all week long, causing some nausea and dizziness as well. Pt denies any drainage from the ear.

## 2023-08-21 ENCOUNTER — Encounter: Payer: Self-pay | Admitting: Internal Medicine

## 2023-08-21 DIAGNOSIS — H9202 Otalgia, left ear: Secondary | ICD-10-CM

## 2023-09-04 ENCOUNTER — Encounter (INDEPENDENT_AMBULATORY_CARE_PROVIDER_SITE_OTHER): Payer: Self-pay | Admitting: Otolaryngology

## 2023-09-08 ENCOUNTER — Ambulatory Visit (INDEPENDENT_AMBULATORY_CARE_PROVIDER_SITE_OTHER): Payer: Medicare HMO | Admitting: Adult Health

## 2023-09-14 ENCOUNTER — Encounter (INDEPENDENT_AMBULATORY_CARE_PROVIDER_SITE_OTHER): Payer: Self-pay | Admitting: Otolaryngology

## 2023-09-15 ENCOUNTER — Ambulatory Visit: Payer: Medicare HMO | Attending: Internal Medicine | Admitting: Internal Medicine

## 2023-09-15 ENCOUNTER — Encounter: Payer: Self-pay | Admitting: Internal Medicine

## 2023-09-15 VITALS — BP 114/68 | HR 79 | Ht 63.0 in | Wt 230.6 lb

## 2023-09-15 DIAGNOSIS — I2510 Atherosclerotic heart disease of native coronary artery without angina pectoris: Secondary | ICD-10-CM

## 2023-09-15 DIAGNOSIS — E785 Hyperlipidemia, unspecified: Secondary | ICD-10-CM | POA: Diagnosis not present

## 2023-09-15 DIAGNOSIS — M791 Myalgia, unspecified site: Secondary | ICD-10-CM | POA: Diagnosis not present

## 2023-09-15 NOTE — Progress Notes (Signed)
 OFFICE NOTE  Chief Complaint:  Leg aches and cramps  Primary Care Physician: Norleen Lynwood ORN, MD  HPI:  [name] is a pleasant 58 year old female who has been suffering with symptoms of chest pain and palpitations for quite some time. Apparently in 2008 she had an episode of chest pain and was admitted to the hospital. She was treated and ruled out and continued to have very intermittent episodes of discomfort over the years. Recently she had some worsening symptoms including chest discomfort and feeling of palpitations. This is followed by significant fatigue and lack of energy for several days after the palpitations have resolved. It is associated with heaviness in the chest that is behind the sternum. It does not radiate to the arm or to the jaw. Sometimes the symptoms are worse when she is exerting herself and improve at rest. Other times they occur at rest. She had recently seen Dr. Levern, for a cardiac opinion. He ordered a stress test which was performed at [facility]. 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 No evidence for inducible ischemia. Normal function with an EF of 66%.  This was interpreted by a Engineer, technical sales.  Apparently she received a call saying her stress test was negative and she was told to followup with him in the office in 3 weeks. When she came back she was allegedly told that her stress test showed a prior heart attack. There does however appear to be conflicting data. Based on this she is seeking another opinion, especially in light of recurrent symptoms. She does report that the palpitation episodes are actually fairly infrequent. She says she's had about 3 episodes of this year and perhaps 2 or 3 episodes last year.  Mrs. Nobile returns today for follow-up. After undergoing noninvasive testing which was  equivocal, there was still some concern for possible coronary ischemia. She underwent definitive cardiac catheterization by myself which demonstrated normal coronary arteries. Based on these findings I'm not suspicious that her chest pain is cardiac. Additionally, she has few risk factors for variant angina or small vessel disease. She does have migraine headaches which could put her at risk for coronary spasm. Treatments for possible coronary spasm include beta blockers, calcium  channel blockers or nitrates. She seems to have had problems with low blood pressure in the past and I did not think will tolerate these medicines. She was started on low-dose propranolol  for migraines and she said she only took 1 pill and felt horrible with it. Therefore, I do not feel she'll tolerate a beta blocker. Her discomfort in the chest is not lifestyle or activity limiting.  01/22/2016  Mrs. Hinkson was seen today in the office. She was sent back for follow-up due to recent ER visit where she was shown to have artery artery calcification on her CT however her cardiac catheterization 2015 showed angiographically normal coronaries. 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 Recently she had reassessment of her cholesterol by her primary care provider. She had been intolerant of statin therapy, in fact had not tolerated both Lipitor and lovastatin  in the past. Her total cholesterol was 305, triglycerides 61, HDL 51 and LDL 241. 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 There is a history of coronary disease in her family. She has  early coronary artery calcifications at age 76 which is considered premature coronary artery disease although she did not have significant obstruction she will need aggressive lipid therapy. Recently she was started on Crestor  20 mg by primary care provider but had side effects from this which  went away after being off of it for a week. She was then switched to simvastatin  20 mg which so far she is tolerating. I did discuss the lipid lowering properties of simvastatin  the fact that she needs more aggressive lowering of LDL cholesterol with a goal LDL of at least 70 if not 50 based on new guidelines, which she will never reached on statin therapy alone. Currently she is on max tolerated statin therapy. I like to add said he had 10 mg to her regimen. We'll also refer her to our lipid clinic as she is a good candidate for the PCSK9 inhibitor Repatha. Her calculated DUTCH score is 6 which is suggestive of FH. She also reports some daytime fatigue, somnolence and difficulty sleeping at night. She's been noted to awaken suddenly with gasping concerning for possible sleep apnea.  03/24/2016  Mrs. Hartzell is seen back today in the office. She's had a marked improved prove minute and her cholesterol on Zocor  and that he had. Amazingly she's had more than a 50% reduction was suggested there may been a dietary component as well. Although it looks like she may have FH, her cholesterol is fairly well-controlled on her current regimen and therefore do not believe we need to pursue Repatha at this time. Recently she had her sleep study interpreted which indicated no evidence of obstructive sleep apnea. This is reassuring. She continues to exercise but is not seen a major weight reduction. Of encouraged her to keep that up because she is making some progress with regards to her health without question.  11/13/2016  Mrs. Galligan returns today for follow-up. Overall she seems to be doing well. She denies any chest pain or worsening shortness of breath. She is only on monotherapy simvastatin . What tried her on that area but she reported intolerance to that. She is due for repeat lipid profile. Unfortunately this been re-gain of weight from 205 up to 213 pounds. She says she is to start working at that heart her with more  exercise and dietary changes. She is also complaining of some left neck and left arm pain which is getting somewhat better but it's difficult for her to move her neck to the left.  05/17/2020  Ms. Desanctis is seen today in follow-up.  She continues to do well.  She denies any chest pain or worsening shortness of breath.  She had no coronary disease by heart catheterization performed in 2015.  She was noted however to have some coronary calcification on CT in 2017.  Weight has continued to go up.  Initially she was around 205, then 213 and now 230 pounds.  She reports less activity.  She says that she does get short of breath walking up stairs and doing some activities which I think is weight related.  She not had recent labs since October at which time her lipids were still elevated with total cholesterol of 211, HDL 61, LDL 133 and triglycerides 85.    12/05/2021  Ms. Farewell returns today for follow-up.  She repeats feeling some fatigue throughout the day.  Blood pressure was noted to be low at 100/78 today.  I am concerned about the possibility of sleep apnea given her morbid obesity.  We did discuss some of the associated conditions and symptoms today.  She filled out a sleepiness score which actually was 0.  That being said, she does get a nonrestorative sleep and is often up at night.  She feels tired the next day.  09/15/2023  Ms. Kagan is seen today in follow-up.  Overall she says she is feeling well.  She recently was in the ER and had side effects related to Reglan .  This has been listed as an intolerance.  Her cholesterol had been running up somewhat.  She attributed this to dietary discretions over the past year.  When she saw her primary care provider apparently she was given rosuvastatin  10 mg daily but was ready on simvastatin /ezetimibe .  Subsequently this prescription ran out but she had 40 mg simvastatin  at home and she continued to take both the simvastatin  and rosuvastatin .  She has noticed  worsening leg pains, cramping and discomfort and feeling older than she felt she should since this time.  PMHx:  Past Medical History:  Diagnosis Date   ANXIETY 03/27/2010   BACK PAIN, CHRONIC 03/27/2010   Classic migraine with aura 05/30/2015   COMMON MIGRAINE 03/27/2010   DISC DISEASE, CERVICAL 03/27/2010   FATIGUE 03/27/2010   GERD 03/27/2010   Glaucoma    bilateral- on meds (eye drops)   HYPERLIPIDEMIA 03/27/2010   on meds   Multiple sclerosis (HCC) 10/23/2010   unconfirmed, but suspected   Myocardial infarction (HCC)    Obesity    Osteoporosis 09/24/2012   on meds   Palpitations    Seasonal allergies    Unspecified vitamin D  deficiency 10/23/2010    Past Surgical History:  Procedure Laterality Date   ABDOMINAL HYSTERECTOMY     partial-uterus removed   LEFT HEART CATHETERIZATION WITH CORONARY ANGIOGRAM N/A 03/31/2014   Procedure: LEFT HEART CATHETERIZATION WITH CORONARY ANGIOGRAM;  Surgeon: Vinie KYM Maxcy, MD;  Location: [facility] CATH LAB;  Service: Cardiovascular;  Laterality: N/A;   Neg stress test  09/08/2005   s/p eye surgury     s/p knee surgury     WISDOM TOOTH EXTRACTION      FAMHx:  Family History  Problem Relation Age of Onset   Cancer Mother        breast   Hyperlipidemia Mother    Colon polyps Mother    Heart disease Father    Diabetes Father    Hyperlipidemia Father    Hypertension Father    Colon polyps Father    Kidney disease Father    Thyroid  disease Father    Lupus Maternal Aunt    Lupus Cousin        mother and father's side   Diabetes Other    Arthritis Other    Dementia Other    Cancer Other        2 uncles with lung cancer   Heart disease Other        2 uncles with heart disease   Colon cancer Neg Hx    Esophageal cancer Neg Hx    Stomach cancer Neg Hx    Rectal cancer Neg Hx     SOCHx:   reports that she has never smoked. She has never used smokeless tobacco. She reports that she does not drink alcohol and does not use  drugs.  ALLERGIES:  Allergies  Allergen Reactions   Reglan  [Metoclopramide ] Palpitations    Felt heart beating fast and chest pressure after medication administration   Topamax Other (See Comments)  dizziness    ROS: Pertinent items noted in HPI and remainder of comprehensive ROS otherwise negative.  HOME MEDS: Current Outpatient Medications  Medication Sig Dispense Refill   alendronate  (FOSAMAX ) 70 MG tablet Take 1 tablet (70 mg total) by mouth every 7 (seven) days. Take with a full glass of water on an empty stomach. 13 tablet 3   aspirin  81 MG EC tablet Take 1 tablet (81 mg total) by mouth daily. Swallow whole. 30 tablet 12   brimonidine  (ALPHAGAN ) 0.2 % ophthalmic solution Place 1 drop into both eyes 3 (three) times daily.     Efinaconazole  10 % SOLN Apply 1 drop topically daily. 4 mL 11   rosuvastatin  (CRESTOR ) 10 MG tablet Take 1 tablet (10 mg total) by mouth daily. 90 tablet 3   simvastatin  (ZOCOR ) 40 MG tablet Take 40 mg by mouth daily.     timolol  (TIMOPTIC ) 0.5 % ophthalmic solution 1 drop 2 (two) times daily.     Vitamin D , Ergocalciferol , (DRISDOL ) 1.25 MG (50000 UNIT) CAPS capsule Take 1 capsule (50,000 Units total) by mouth every 7 (seven) days. 4 capsule 0   ezetimibe -simvastatin  (VYTORIN ) 10-40 MG tablet TAKE 1 TABLET BY MOUTH EVERY DAY (Patient not taking: Reported on 09/15/2023) 30 tablet 0   Current Facility-Administered Medications  Medication Dose Route Frequency Provider Last Rate Last Admin   0.9 %  sodium chloride  infusion  500 mL Intravenous Once Aneita Gwendlyn DASEN, MD        LABS/IMAGING: No results found for this or any previous visit (from the past 48 hours). No results found.  VITALS: BP 114/68 (BP Location: Left Arm, Patient Position: Sitting, Cuff Size: Large)   Pulse 79   Ht 5' 3 (1.6 m)   Wt 230 lb 9.6 oz (104.6 kg)   SpO2 98%   BMI 40.85 kg/m   EXAM: General appearance: alert, no distress and morbidly obese Neck: no carotid bruit, no JVD  and thyroid  not enlarged, symmetric, no tenderness/mass/nodules Lungs: clear to auscultation bilaterally Heart: regular rate and rhythm, S1, S2 normal, no murmur, click, rub or gallop Abdomen: soft, non-tender; bowel sounds normal; no masses,  no organomegaly Extremities: extremities normal, atraumatic, no cyanosis or edema Pulses: 2+ and symmetric Skin: Skin color, texture, turgor normal. No rashes or lesions Neurologic: Grossly normal Psych: Pleasant  EKG: ER EKG 08/24/23 personally reviewed - NSR, borderline inferior ST elevation  ASSESSMENT: History of chest pain and palpitations - normal coronary arteries by catheterization by myself (2015) Coronary artery calcification on CT scan (2017) Possible history of multiple sclerosis Mixed dyslipidemia, goal LDL <70  PLAN: 1.   Ms. Sessler has been describing symptoms that are likely related to statin therapy.  Unfortunately she has been on 2 different statins which is not advisable due to higher risk of myalgias and no synergistic effect of the medications.  I advised her to stop both statins and take a 2-week holiday.  If her symptoms resolve then would likely recommend starting rosuvastatin  10 mg daily by itself.  She will need repeat lipids but likely 3 months after being on monotherapy.  Her target LDL is less than 70.  No coronary symptoms.  Recent EKG did show some borderline EKG changes however she was having side effects on Reglan  and no chest pain.  From a cardiac standpoint she seems to be doing well.  She had no angiographic coronary disease by cath in 2015 but was noted to have some coronary calcifications in 2017.  Will plan  follow-up with repeat lipids including NMR and LP(a) in about 4 months.  Vinie KYM Maxcy, MD, [facility], FACP  [place]  [facility]  Medical Director of the Advanced Lipid Disorders &  Cardiovascular Risk Reduction Clinic Diplomate of the American Board of Clinical Lipidology Attending Cardiologist   Direct Dial: [phone]  Fax: [phone]  Website:  [email or web address] Vinie BROCKS Betta Balla 09/15/2023, 10:38 AM

## 2023-09-15 NOTE — Patient Instructions (Signed)
 Medication Instructions:  HOLD simvastatin  and rosuvastatin  for two weeks -- please contact our office to let us  know how you are doing off the medications   *If you need a refill on your cardiac medications before your next appointment, please call your pharmacy*   Lab Work: FASTING lab work to check cholesterol in 3 months -- NMR lipoprofile and LPa  If you have labs (blood work) drawn today and your tests are completely normal, you will receive your results only by: MyChart Message (if you have MyChart) OR A paper copy in the mail If you have any lab test that is abnormal or we need to change your treatment, we will call you to review the results.   Follow-Up: At [facility], you and your health needs are our priority.  As part of our continuing mission to provide you with exceptional heart care, we have created designated Provider Care Teams.  These Care Teams include your primary Cardiologist (physician) and Advanced Practice Providers (APPs -  Physician Assistants and Nurse Practitioners) who all work together to provide you with the care you need, when you need it.  We recommend signing up for the patient portal called MyChart.  Sign up information is provided on this After Visit Summary.  MyChart is used to connect with patients for Virtual Visits (Telemedicine).  Patients are able to view lab/test results, encounter notes, upcoming appointments, etc.  Non-urgent messages can be sent to your provider as well.   To learn more about what you can do with MyChart, go to [email or web address].    Your next appointment:    4-6 months with Dr. Mona

## 2023-09-16 ENCOUNTER — Encounter: Payer: Self-pay | Admitting: Internal Medicine

## 2023-09-18 ENCOUNTER — Other Ambulatory Visit: Payer: Self-pay | Admitting: Internal Medicine

## 2023-09-18 ENCOUNTER — Encounter: Payer: Self-pay | Admitting: Internal Medicine

## 2023-09-18 ENCOUNTER — Ambulatory Visit: Payer: PRIVATE HEALTH INSURANCE | Admitting: Internal Medicine

## 2023-09-18 VITALS — BP 124/80 | HR 64 | Temp 98.1°F | Ht 63.0 in | Wt 229.0 lb

## 2023-09-18 DIAGNOSIS — Z23 Encounter for immunization: Secondary | ICD-10-CM

## 2023-09-18 DIAGNOSIS — E538 Deficiency of other specified B group vitamins: Secondary | ICD-10-CM

## 2023-09-18 DIAGNOSIS — E559 Vitamin D deficiency, unspecified: Secondary | ICD-10-CM | POA: Diagnosis not present

## 2023-09-18 DIAGNOSIS — R7303 Prediabetes: Secondary | ICD-10-CM

## 2023-09-18 DIAGNOSIS — Z0000 Encounter for general adult medical examination without abnormal findings: Secondary | ICD-10-CM | POA: Diagnosis not present

## 2023-09-18 DIAGNOSIS — E7800 Pure hypercholesterolemia, unspecified: Secondary | ICD-10-CM

## 2023-09-18 DIAGNOSIS — Z0001 Encounter for general adult medical examination with abnormal findings: Secondary | ICD-10-CM

## 2023-09-18 DIAGNOSIS — Z124 Encounter for screening for malignant neoplasm of cervix: Secondary | ICD-10-CM

## 2023-09-18 DIAGNOSIS — I2510 Atherosclerotic heart disease of native coronary artery without angina pectoris: Secondary | ICD-10-CM

## 2023-09-18 LAB — URINALYSIS, ROUTINE W REFLEX MICROSCOPIC
Bilirubin Urine: NEGATIVE
Hgb urine dipstick: NEGATIVE
Ketones, ur: 15 — AB
Leukocytes,Ua: NEGATIVE
Nitrite: NEGATIVE
Specific Gravity, Urine: 1.02 (ref 1.000–1.030)
Total Protein, Urine: NEGATIVE
Urine Glucose: NEGATIVE
Urobilinogen, UA: 0.2 (ref 0.0–1.0)
pH: 6 (ref 5.0–8.0)

## 2023-09-18 LAB — BASIC METABOLIC PANEL
BUN: 13 mg/dL (ref 6–23)
CO2: 29 meq/L (ref 19–32)
Calcium: 9.7 mg/dL (ref 8.4–10.5)
Chloride: 102 meq/L (ref 96–112)
Creatinine, Ser: 0.59 mg/dL (ref 0.40–1.20)
GFR: 99.52 mL/min (ref >60.00)
Glucose, Bld: 84 mg/dL (ref 70–99)
Potassium: 4.4 meq/L (ref 3.5–5.1)
Sodium: 140 meq/L (ref 135–145)

## 2023-09-18 LAB — HEMOGLOBIN A1C: Hgb A1c MFr Bld: 6 % (ref 4.6–6.5)

## 2023-09-18 LAB — CBC WITH DIFFERENTIAL/PLATELET
Basophils Absolute: 0.1 10*3/uL (ref 0.0–0.1)
Basophils Relative: 0.8 % (ref 0.0–3.0)
Eosinophils Absolute: 0.1 10*3/uL (ref 0.0–0.7)
Eosinophils Relative: 1.5 % (ref 0.0–5.0)
HCT: 43.2 % (ref 36.0–46.0)
Hemoglobin: 14.4 g/dL (ref 12.0–15.0)
Lymphocytes Relative: 34 % (ref 12.0–46.0)
Lymphs Abs: 2.3 10*3/uL (ref 0.7–4.0)
MCHC: 33.3 g/dL (ref 30.0–36.0)
MCV: 94.4 fL (ref 78.0–100.0)
Monocytes Absolute: 0.5 10*3/uL (ref 0.1–1.0)
Monocytes Relative: 6.9 % (ref 3.0–12.0)
Neutro Abs: 3.8 10*3/uL (ref 1.4–7.7)
Neutrophils Relative %: 56.8 % (ref 43.0–77.0)
Platelets: 277 10*3/uL (ref 150.0–400.0)
RBC: 4.57 Mil/uL (ref 3.87–5.11)
RDW: 14.2 % (ref 11.5–15.5)
WBC: 6.7 10*3/uL (ref 4.0–10.5)

## 2023-09-18 LAB — LIPID PANEL
Cholesterol: 202 mg/dL — ABNORMAL HIGH (ref 0–200)
HDL: 53.7 mg/dL (ref >39.00)
LDL Cholesterol: 136 mg/dL — ABNORMAL HIGH (ref 0–99)
NonHDL: 147.81
Total CHOL/HDL Ratio: 4
Triglycerides: 61 mg/dL (ref 0.0–149.0)
VLDL: 12.2 mg/dL (ref 0.0–40.0)

## 2023-09-18 LAB — HEPATIC FUNCTION PANEL
ALT: 19 U/L (ref 0–35)
AST: 23 U/L (ref 0–37)
Albumin: 4.5 g/dL (ref 3.5–5.2)
Alkaline Phosphatase: 87 U/L (ref 39–117)
Bilirubin, Direct: 0.1 mg/dL (ref 0.0–0.3)
Total Bilirubin: 0.4 mg/dL (ref 0.2–1.2)
Total Protein: 8.1 g/dL (ref 6.0–8.3)

## 2023-09-18 LAB — TSH: TSH: 1.02 u[IU]/mL (ref 0.35–5.50)

## 2023-09-18 MED ORDER — ROSUVASTATIN CALCIUM 40 MG PO TABS: 40.0000 mg | ORAL_TABLET | Freq: Every day | ORAL | 3 refills | Status: DC

## 2023-09-18 NOTE — Patient Instructions (Signed)
 You had the Prevnar 20 pneumonia shot today  Please continue all other medications as before, and refills have been done if requested.  Please have the pharmacy call with any other refills you may need.  Please continue your efforts at being more active, low cholesterol diet, and weight control.  You are otherwise up to date with prevention measures today.  Please keep your appointments with your specialists as you may have planned  You will be contacted regarding the referral for: Cardiac CT score  Please go to the LAB at the blood drawing area for the tests to be done  You will be contacted by phone if any changes need to be made immediately.  Otherwise, you will receive a letter about your results with an explanation, but please check with MyChart first.  Please make an Appointment to return in 6 months, or sooner if needed

## 2023-09-18 NOTE — Progress Notes (Signed)
 Patient ID: [name], female   DOB: 10-15-1965, 58 y.o.   MRN: [id number]         Chief Complaint:: wellness exam and coronary calcif's on CT, preDM, hld, low vit B12 and D       HPI:  [name] is a 58 y.o. female here for wellness exam; declines covid booster, due for prevnar 20 , o/w up to date                        Also Pt denies chest pain, increased sob or doe, wheezing, orthopnea, PND, increased LE swelling, palpitations, dizziness or syncope.   Pt denies polydipsia, polyuria, or new focal neuro s/s.    Pt denies fever, wt loss, night sweats, loss of appetite, or other constitutional symptoms  Has had coronary calcifications on CT, willing for Card Ct score.     Wt Readings from Last 3 Encounters:  09/18/23 229 lb (103.9 kg)  09/15/23 230 lb 9.6 oz (104.6 kg)  08/20/23 227 lb 1.2 oz (103 kg)   BP Readings from Last 3 Encounters:  09/18/23 124/80  09/15/23 114/68  08/20/23 120/80   Immunization History  Administered Date(s) Administered   Influenza, Seasonal, Injecte, Preservative Fre 06/15/2023   Influenza,inj,Quad PF,6+ Mos 07/01/2019, 07/26/2021, 07/25/2022   PFIZER Comirnaty(Gray Top)Covid-19 Tri-Sucrose Vaccine 02/18/2021   PFIZER(Purple Top)SARS-COV-2 Vaccination 11/19/2019, 12/13/2019, 06/18/2020, 07/09/2020   PNEUMOCOCCAL CONJUGATE-20 09/18/2023   Pfizer Covid-19 Vaccine Bivalent Booster 39yrs & up 07/08/2021   Td 03/27/2010   Tdap 01/25/2021   Unspecified SARS-COV-2 Vaccination 06/02/2022   Zoster Recombinant(Shingrix) 09/16/2021, 11/18/2021   Health Maintenance Due  Topic Date Due   Cervical Cancer Screening (HPV/Pap Cotest)  06/08/2012   Medicare Annual Wellness (AWV)  06/11/2023      Past Medical History:  Diagnosis Date   ANXIETY 03/27/2010   BACK PAIN, CHRONIC 03/27/2010   Classic migraine with aura 05/30/2015   COMMON MIGRAINE 03/27/2010   DISC DISEASE, CERVICAL 03/27/2010   FATIGUE 03/27/2010   GERD 03/27/2010   Glaucoma    bilateral-  on meds (eye drops)   HYPERLIPIDEMIA 03/27/2010   on meds   Multiple sclerosis (HCC) 10/23/2010   unconfirmed, but suspected   Myocardial infarction (HCC)    Obesity    Osteoporosis 09/24/2012   on meds   Palpitations    Seasonal allergies    Unspecified vitamin D  deficiency 10/23/2010   Past Surgical History:  Procedure Laterality Date   ABDOMINAL HYSTERECTOMY     partial-uterus removed   LEFT HEART CATHETERIZATION WITH CORONARY ANGIOGRAM N/A 03/31/2014   Procedure: LEFT HEART CATHETERIZATION WITH CORONARY ANGIOGRAM;  Surgeon: Vinie KYM Maxcy, MD;  Location: [facility] CATH LAB;  Service: Cardiovascular;  Laterality: N/A;   Neg stress test  09/08/2005   s/p eye surgury     s/p knee surgury     WISDOM TOOTH EXTRACTION      reports that she has never smoked. She has never used smokeless tobacco. She reports that she does not drink alcohol and does not use drugs. family history includes Arthritis in an other family member; Cancer in her mother and another family member; Colon polyps in her father and mother; Dementia in an other family member; Diabetes in her father and another family member; Heart disease in her father and another family member; Hyperlipidemia in her father and mother; Hypertension in her father; Kidney disease in her father; Lupus in her cousin and maternal aunt; Thyroid  disease  in her father. Allergies  Allergen Reactions   Reglan  [Metoclopramide ] Palpitations    Felt heart beating fast and chest pressure after medication administration   Topamax Other (See Comments)    dizziness   Current Outpatient Medications on File Prior to Visit  Medication Sig Dispense Refill   alendronate  (FOSAMAX ) 70 MG tablet Take 1 tablet (70 mg total) by mouth every 7 (seven) days. Take with a full glass of water on an empty stomach. 13 tablet 3   aspirin  81 MG EC tablet Take 1 tablet (81 mg total) by mouth daily. Swallow whole. 30 tablet 12   brimonidine  (ALPHAGAN ) 0.2 % ophthalmic  solution Place 1 drop into both eyes 3 (three) times daily.     Efinaconazole  10 % SOLN Apply 1 drop topically daily. 4 mL 11   timolol  (TIMOPTIC ) 0.5 % ophthalmic solution 1 drop 2 (two) times daily.     Vitamin D , Ergocalciferol , (DRISDOL ) 1.25 MG (50000 UNIT) CAPS capsule Take 1 capsule (50,000 Units total) by mouth every 7 (seven) days. 4 capsule 0   No current facility-administered medications on file prior to visit.        ROS:  All others reviewed and negative.  Objective        PE:  BP 124/80 (BP Location: Left Arm, Patient Position: Sitting, Cuff Size: Normal)   Pulse 64   Temp 98.1 F (36.7 C) (Oral)   Ht 5' 3 (1.6 m)   Wt 229 lb (103.9 kg)   SpO2 99%   BMI 40.57 kg/m                 Constitutional: Pt appears in NAD               HENT: Head: NCAT.                Right Ear: External ear normal.                 Left Ear: External ear normal.                Eyes: . Pupils are equal, round, and reactive to light. Conjunctivae and EOM are normal               Nose: without d/c or deformity               Neck: Neck supple. Gross normal ROM               Cardiovascular: Normal rate and regular rhythm.                 Pulmonary/Chest: Effort normal and breath sounds without rales or wheezing.                Abd:  Soft, NT, ND, + BS, no organomegaly               Neurological: Pt is alert. At baseline orientation, motor grossly intact               Skin: Skin is warm. No rashes, no other new lesions, LE edema - none               Psychiatric: Pt behavior is normal without agitation   Micro: none  Cardiac tracings I have personally interpreted today:  none  Pertinent Radiological findings (summarize): none   Lab Results  Component Value Date   WBC 6.7 09/18/2023   HGB 14.4 09/18/2023   HCT 43.2 09/18/2023  PLT 277.0 09/18/2023   GLUCOSE 84 09/18/2023   CHOL 202 (H) 09/18/2023   TRIG 61.0 09/18/2023   HDL 53.70 09/18/2023   LDLDIRECT 184.9 08/04/2012   LDLCALC  136 (H) 09/18/2023   ALT 19 09/18/2023   AST 23 09/18/2023   NA 140 09/18/2023   K 4.4 09/18/2023   CL 102 09/18/2023   CREATININE 0.59 09/18/2023   BUN 13 09/18/2023   CO2 29 09/18/2023   TSH 1.02 09/18/2023   INR 0.99 05/24/2015   HGBA1C 6.0 09/18/2023   Assessment/Plan:  [name] is a 58 y.o. Black or African American [2] female with  has a past medical history of ANXIETY (03/27/2010), BACK PAIN, CHRONIC (03/27/2010), Classic migraine with aura (05/30/2015), COMMON MIGRAINE (03/27/2010), DISC DISEASE, CERVICAL (03/27/2010), FATIGUE (03/27/2010), GERD (03/27/2010), Glaucoma, HYPERLIPIDEMIA (03/27/2010), Multiple sclerosis (HCC) (10/23/2010), Myocardial infarction (HCC), Obesity, Osteoporosis (09/24/2012), Palpitations, Seasonal allergies, and Unspecified vitamin D  deficiency (10/23/2010).  Coronary artery calcification seen on CT scan Noted on CT, ok to quantify with Card Ct score  Encounter for well adult exam with abnormal findings Age and sex appropriate education and counseling updated with regular exercise and diet Referrals for preventative services - none needed Immunizations addressed - declines covid boostser, for prevnar 20 today Smoking counseling  - none needed Evidence for depression or other mood disorder - none significant Most recent labs reviewed. I have personally reviewed and have noted: 1) the patient's medical and social history 2) The patient's current medications and supplements 3) The patient's height, weight, and BMI have been recorded in the chart   Hypercholesterolemia Lab Results  Component Value Date   LDLCALC 136 (H) 09/18/2023   Uncontrolled, declines statin or other antilipid for now, but willing for Card CT score, pt for lower chol diet   Prediabetes Lab Results  Component Value Date   HGBA1C 6.0 09/18/2023   Stable, pt to continue current medical treatment  - diet, wt control   Vitamin B12 deficiency Last vitamin D  Lab  Results  Component Value Date   VD25OH 43.84 04/06/2023   Stable, cont oral replacement   Vitamin D  deficiency Last vitamin D  Lab Results  Component Value Date   VD25OH 43.84 04/06/2023   Stable, cont oral replacement  Followup: Return in about 6 months (around 03/17/2024).  Agent Rush, MD 09/21/2023 3:57 PM [facility] Internal Medicine

## 2023-09-21 ENCOUNTER — Encounter: Payer: Self-pay | Admitting: Internal Medicine

## 2023-09-21 NOTE — Assessment & Plan Note (Addendum)
 Age and sex appropriate education and counseling updated with regular exercise and diet Referrals for preventative services - none needed Immunizations addressed - declines covid boostser, for prevnar 20 today Smoking counseling  - none needed Evidence for depression or other mood disorder - none significant Most recent labs reviewed. I have personally reviewed and have noted: 1) the patient's medical and social history 2) The patient's current medications and supplements 3) The patient's height, weight, and BMI have been recorded in the chart

## 2023-09-21 NOTE — Assessment & Plan Note (Signed)
 Noted on CT, ok to quantify with Card Ct score

## 2023-09-21 NOTE — Assessment & Plan Note (Signed)
Last vitamin D Lab Results  Component Value Date   VD25OH 43.84 04/06/2023   Stable, cont oral replacement

## 2023-09-21 NOTE — Assessment & Plan Note (Signed)
 Lab Results  Component Value Date   LDLCALC 136 (H) 09/18/2023   Uncontrolled, declines statin or other antilipid for now, but willing for Card CT score, pt for lower chol diet

## 2023-09-21 NOTE — Assessment & Plan Note (Signed)
 Lab Results  Component Value Date   HGBA1C 6.0 09/18/2023   Stable, pt to continue current medical treatment  - diet, wt control

## 2023-10-01 ENCOUNTER — Ambulatory Visit (HOSPITAL_COMMUNITY): Payer: PRIVATE HEALTH INSURANCE

## 2023-10-06 ENCOUNTER — Ambulatory Visit (INDEPENDENT_AMBULATORY_CARE_PROVIDER_SITE_OTHER): Payer: Medicare HMO | Admitting: Adult Health

## 2023-10-26 ENCOUNTER — Ambulatory Visit (INDEPENDENT_AMBULATORY_CARE_PROVIDER_SITE_OTHER): Payer: Medicare HMO | Admitting: Adult Health

## 2023-11-02 ENCOUNTER — Other Ambulatory Visit: Payer: Self-pay | Admitting: Internal Medicine

## 2023-11-06 ENCOUNTER — Ambulatory Visit: Payer: PRIVATE HEALTH INSURANCE | Admitting: Internal Medicine

## 2023-11-06 ENCOUNTER — Encounter: Payer: Self-pay | Admitting: Internal Medicine

## 2023-11-06 VITALS — BP 110/70 | HR 71 | Ht 63.0 in | Wt 234.0 lb

## 2023-11-06 DIAGNOSIS — M810 Age-related osteoporosis without current pathological fracture: Secondary | ICD-10-CM | POA: Diagnosis not present

## 2023-11-06 MED ORDER — ALENDRONATE SODIUM 70 MG PO TABS: 70.0000 mg | ORAL_TABLET | ORAL | 3 refills | Status: AC

## 2023-11-06 NOTE — Patient Instructions (Signed)
 Continue Fosamax 70 mg weekly Continue Calcium 1200 mg daily  Continue Vitamin D 3000 international unit  daily

## 2023-11-06 NOTE — Progress Notes (Signed)
 Name: [name]  MRN/ DOB: [id number], Sep 20, 1965    Age/ Sex: 58 y.o., female    PCP: Corwin Levins, MD   Reason for Endocrinology Evaluation: Osteoporosis      Date of Initial Endocrinology Evaluation: 11/04/2020    HPI: [name] is a 58 y.o. female with a past medical history of Dyslipidemia, Hx of infarct on stress test ,  and Osteoporosis . The patient presented for initial endocrinology clinic visit on 11/04/2020  for consultative assistance with her Osteoporosis .   Pt was diagnosed with osteoporosis: 2013  with a Tscore of -2.6 at the femoral neck   Menarche at age : 3 Menopausal at age : S/P hysterectomy  Around age late 40's Fracture Hx: Compression spinal vertebrae on Xray  Hx of HRT: no  FH of osteoporosis or hip fracture: no Prior Hx of anti-estrogenic therapy : no Prior Hx of anti-resorptive therapy : I have offered her prolia in 01/2021 but she declined due to her concerns about hyperlipidemia. She declined anabolic agents due to concerns about side effects. Started Alendronate 01/2021   24-hr urinary cortisol was normal 02/2021 24-hr urinary calcium was normal 148 mg 02/2021 SUBJECTIVE:    Today (11/06/23):  [name] is here for a follow up on osteoporosis.    She denies any hearburn  Stable chronic constipation but no diarrhea  Denies recent falls  No recent fractures    Vitamin D  3000 daily Calcium 1200 mg daily  Alendronate 70 mg weekly   HISTORY:  Past Medical History:  Past Medical History:  Diagnosis Date   ANXIETY 03/27/2010   BACK PAIN, CHRONIC 03/27/2010   Classic migraine with aura 05/30/2015   COMMON MIGRAINE 03/27/2010   DISC DISEASE, CERVICAL 03/27/2010   FATIGUE 03/27/2010   GERD 03/27/2010   Glaucoma    bilateral- on meds (eye drops)   HYPERLIPIDEMIA 03/27/2010   on meds   Multiple sclerosis (HCC) 10/23/2010   unconfirmed, but suspected   Myocardial infarction (HCC)    Obesity    Osteoporosis 09/24/2012   on  meds   Palpitations    Seasonal allergies    Unspecified vitamin D deficiency 10/23/2010   Past Surgical History:  Past Surgical History:  Procedure Laterality Date   ABDOMINAL HYSTERECTOMY     partial-uterus removed   LEFT HEART CATHETERIZATION WITH CORONARY ANGIOGRAM N/A 03/31/2014   Procedure: LEFT HEART CATHETERIZATION WITH CORONARY ANGIOGRAM;  Surgeon: Chrystie Nose, MD;  Location[facility] CATH LAB;  Service: Cardiovascular;  Laterality: N/A;   Neg stress test  09/08/2005   s/p eye surgury     s/p knee surgury     WISDOM TOOTH EXTRACTION      Social History:  reports that she has never smoked. She has never used smokeless tobacco. She reports that she does not drink alcohol and does not use drugs. Family History: family history includes Arthritis in an other family member; Cancer in her mother and another family member; Colon polyps in her father and mother; Dementia in an other family member; Diabetes in her father and another family member; Heart disease in her father and another family member; Hyperlipidemia in her father and mother; Hypertension in her father; Kidney disease in her father; Lupus in her cousin and maternal aunt; Thyroid disease in her father.   HOME MEDICATIONS: Allergies as of 11/06/2023       Reactions   Reglan [metoclopramide] Palpitations   Felt heart beating fast and  chest pressure after medication administration   Topamax Other (See Comments)   dizziness        Medication List        Accurate as of November 06, 2023  8:27 AM. If you have any questions, ask your nurse or doctor.          alendronate 70 MG tablet Commonly known as: FOSAMAX TAKE 1 TABLET (70 MG TOTAL) BY MOUTH EVERY 7 DAYS WITH FULL GLASS WATER ON EMPTY STOMACH   aspirin EC 81 MG tablet Take 1 tablet (81 mg total) by mouth daily. Swallow whole.   brimonidine 0.2 % ophthalmic solution Commonly known as: ALPHAGAN Place 1 drop into both eyes 3 (three) times daily.    Efinaconazole 10 % Soln Apply 1 drop topically daily.   rosuvastatin 40 MG tablet Commonly known as: CRESTOR Take 1 tablet (40 mg total) by mouth daily.   timolol 0.5 % ophthalmic solution Commonly known as: TIMOPTIC 1 drop 2 (two) times daily.   Vitamin D (Ergocalciferol) 1.25 MG (50000 UNIT) Caps capsule Commonly known as: DRISDOL Take 1 capsule (50,000 Units total) by mouth every 7 (seven) days.            OBJECTIVE:  VS: BP 110/70 (BP Location: Left Arm, Patient Position: Sitting, Cuff Size: Normal)   Pulse 71   Ht 5\' 3"  (1.6 m)   Wt 234 lb (106.1 kg)   SpO2 98%   BMI 41.45 kg/m    Wt Readings from Last 3 Encounters:  11/06/23 234 lb (106.1 kg)  09/18/23 229 lb (103.9 kg)  09/15/23 230 lb 9.6 oz (104.6 kg)     EXAM: General: Pt appears well and is in NAD  Neck:  Thyroid: Thyroid size normal.  No goiter or nodules appreciated.   Lungs: Clear with good BS bilat   Heart: Auscultation: RRR.  Abdomen: Soft, nontender  Extremities:  BL LE: No pretibial edema   Mental Status: Judgment, insight: Intact Memory: Intact for recent and remote events Mood and affect: No depression, anxiety, or agitation     DATA REVIEWED:    Latest Reference Range & Units 09/18/23 09:29  Sodium 135 - 145 mEq/L 140  Potassium 3.5 - 5.1 mEq/L 4.4  Chloride 96 - 112 mEq/L 102  CO2 19 - 32 mEq/L 29  Glucose 70 - 99 mg/dL 84  BUN 6 - 23 mg/dL 13  Creatinine 3.24 - 4.01 mg/dL 0.27  Calcium 8.4 - 25.3 mg/dL 9.7  Alkaline Phosphatase 39 - 117 U/L 87  Albumin 3.5 - 5.2 g/dL 4.5  AST 0 - 37 U/L 23  ALT 0 - 35 U/L 19  Total Protein 6.0 - 8.3 g/dL 8.1  Bilirubin, Direct 0.0 - 0.3 mg/dL 0.1  Total Bilirubin 0.2 - 1.2 mg/dL 0.4  GFR >66.44 mL/min 99.52    Latest Reference Range & Units 09/18/23 09:29  TSH 0.35 - 5.50 uIU/mL 1.02     DXA 02/13/2023   Results:   Lumbar spine L1-L4(L2) Femoral neck (FN)  T-score -1.6 RFN: -2.1 LFN: -2.4  Change in BMD from previous DXA test  (%) Up 10.4% Up 6.1%*  (*) statistically significant   Assessment: By the [facility] Criteria for diagnosis based on bone density, this patient has Low Bone Density    Thoracic Spine X-Ray 01/25/21   Old mild superior endplate compression deformities are seen involving the T9 and T10 vertebral bodies, which are stable since prior exam. No evidence of acute thoracic spine fracture or subluxation.  No focal lytic or sclerotic bone lesions identified. Anterior vertebral osteophytosis is noted at T11-12. Intervertebral disc spaces are maintained. Mild lower thoracic dextroscoliosis is also stable since prior study.   IMPRESSION: No acute findings.   Old mild T9 and T10 vertebral body compression deformities and mild lower thoracic dextroscoliosis. ASSESSMENT/PLAN/RECOMMENDATIONS:   Osteoporosis:  - Pt has an old compression fracture of the thoracic spine based on that information I have recommended Evenity or Forteo/Tymlos. But she declined these due to family hx of strokes, CAD and cancer, despite my assurance she is low risk.  - She declined Prolia due to concerns of hyperlipidemia  -She is tolerating alendronate without side effects -We again emphasized the importance of optimizing calcium and vitamin D intake -We did emphasize the importance of weightbearing exercises when possible - DXA 02/2023 showed 10.4% improvement at the AP spine and 6.1% improvement at the femoral necks  Medications : Vitamin D 3000 iu daily  Calcium 1200 mg daily  Alendronate 70 mg weekly     F/U in 1 yr   Signed electronically by: Lyndle Herrlich, MD  [facility] Endocrinology  [facility] [address] [place] [place], [place] [address] Phone: [phone] FAX: [phone]   CC: Scarlette, [place], MD [address] [place] [address] Phone: [phone] Fax: [phone]   Return to Endocrinology clinic as below: Future Appointments  Date Time Provider Department  Center  11/06/2023  8:30 AM Renie Stelmach, Konrad Dolores, MD [facility] None  11/19/2023 10:00 AM Julaine Fusi, NP [facility] None  01/14/2024  9:40 AM Chrystie Nose, MD [facility] None  03/18/2024 10:00 AM Corwin Levins, MD [facility] None

## 2023-11-14 ENCOUNTER — Other Ambulatory Visit: Payer: Self-pay | Admitting: Adult Health

## 2023-11-14 ENCOUNTER — Other Ambulatory Visit (INDEPENDENT_AMBULATORY_CARE_PROVIDER_SITE_OTHER): Payer: Self-pay | Admitting: Adult Health

## 2023-11-14 DIAGNOSIS — E559 Vitamin D deficiency, unspecified: Secondary | ICD-10-CM

## 2023-11-19 ENCOUNTER — Ambulatory Visit (INDEPENDENT_AMBULATORY_CARE_PROVIDER_SITE_OTHER): Payer: Medicare HMO | Admitting: Adult Health

## 2023-12-09 ENCOUNTER — Encounter (INDEPENDENT_AMBULATORY_CARE_PROVIDER_SITE_OTHER): Payer: Self-pay | Admitting: Family Medicine

## 2023-12-09 ENCOUNTER — Ambulatory Visit (INDEPENDENT_AMBULATORY_CARE_PROVIDER_SITE_OTHER): Admitting: Family Medicine

## 2023-12-09 VITALS — BP 118/80 | HR 65 | Temp 97.7°F | Ht 64.0 in | Wt 220.0 lb

## 2023-12-09 DIAGNOSIS — E559 Vitamin D deficiency, unspecified: Secondary | ICD-10-CM

## 2023-12-09 DIAGNOSIS — E7800 Pure hypercholesterolemia, unspecified: Secondary | ICD-10-CM

## 2023-12-09 DIAGNOSIS — E66812 Obesity, class 2: Secondary | ICD-10-CM

## 2023-12-09 DIAGNOSIS — Z6837 Body mass index (BMI) 37.0-37.9, adult: Secondary | ICD-10-CM | POA: Diagnosis not present

## 2023-12-09 DIAGNOSIS — R7303 Prediabetes: Secondary | ICD-10-CM | POA: Diagnosis not present

## 2023-12-09 NOTE — Progress Notes (Signed)
 [name], D.O.  ABFM, ABOM Specializing in Clinical Bariatric Medicine  Office located at: [address] [place], [place]  [address]   Assessment and Plan:   Orders Placed This Encounter  Procedures   Hemoglobin A1c   Hepatic function panel   VITAMIN D 25 Hydroxy (Vit-D Deficiency, Fractures)   Vitamin B12   Insulin, random    There are no discontinued medications.   No orders of the defined types were placed in this encounter.   Will review labs obtained today with pt at next OV (A1c, Vit D, B12, and insulin).  FOR THE DISEASE OF OBESITY:  Obesity,current BMI 37.74 Morbid obesity (HCC)-RESTART DATE 07/13/2023 BMI 40.15 Assessment & Plan: Since last office visit on 08/03/2023, patient's muscle mass has decreased by 0.8 lbs. Fat mass has decreased by 6.8 lbs. Total body water has decreased by 3 lbs.  Counseling done on how various foods will affect these numbers and how to maximize success  Total lbs lost to date: 14 lbs Total weight loss percentage to date: 5.98%    Recommended Dietary Goals [name] is currently in the action stage of change. As such, her goal is to continue weight management plan.  She has agreed to: continue current plan   Behavioral Intervention We discussed the following today: decreasing simple carbohydrates , increasing lower glycemic fruits, work on managing stress, creating time for self-care and relaxation, and continue to work on implementation of reduced calorie nutritional plan  Additional resources provided today: [name], Handout on CAT 1 meal plan , Handout on CAT 1-2 breakfast options, and Handout on CAT 1-2 lunch options  Evidence-based interventions for health behavior change were utilized today including the discussion of self monitoring techniques, problem-solving barriers and SMART goal setting techniques.   Regarding patient's less desirable eating habits and patterns, we employed the technique of small changes.    Pt will specifically work on: Meditate 10-15 minutes twice daily and increase water intake for next visit.    Recommended Physical Activity Goals [name] has been advised to work up to 150 minutes of moderate intensity aerobic activity a week and strengthening exercises 2-3 times per week for cardiovascular health, weight loss maintenance and preservation of muscle mass.   She has agreed to :  Continue current level of physical activity    Pharmacotherapy We both agreed to : continue with nutritional and behavioral strategies   FOR ASSOCIATED CONDITIONS ADDRESSED TODAY:  Pure hypercholesterolemia Assessment & Plan: Lab Results  Component Value Date   CHOL 202 (H) 09/18/2023   HDL 53.70 09/18/2023   LDLCALC 136 (H) 09/18/2023   LDLDIRECT 184.9 08/04/2012   TRIG 61.0 09/18/2023   CHOLHDL 4 09/18/2023  [name] is on Crestor 40 mg once daily. Per last lipid panel, pt's LDL was 136 so she was previously put on Simvastatin 40 mg and Crestor 10 mg. With this medication regimen, pt experienced myalgias and was told to stop both medications for two weeks then restart Crestor 40 mg. Pt received a cardiac catherization in 2015 which did not show CAD, and a CAC scan in 2017 which did show coronary calcification. On 09/15/2023, Dr. Rennis Golden of cardiology recommended pt f/up for repeat lipids, NMR, and LPa in 4 months (01/2024). Additionally, there is a pending order by PCP for CAC scan.   Recommended pt f/up with PCP about pending order. Will not get lipid panel today because pt has appointment scheduled with cardiologist on 05/08 for extended lipid panel. Will  check hepatic function. Continue Crestor per cardiologist and f/up for labs at next appointment. Will continue to monitor alongside specialist.   Relevant Orders: -     Hepatic function panel   Pre-diabetes Assessment & Plan: [name] is not taking any medication for this condition. Diet/exercise approach. Pt admits to not following meal  plan. Stressed importance of dietary and lifestyle modifications to result in weight loss as first line txmnt. Explained role of simple carbs and insulin levels on hunger and cravings. [name] agreed to restart implementation of CAT 1 MP adhering to high lean protein and low simple carbs. Will recheck A1c and fasting insulin today.   Relevant Orders: -     Hemoglobin A1c -     Insulin, random   Vitamin D deficiency Assessment & Plan: [name] reports taking OTC Vit D 3000 units. She has been taking this dose since February, per pt. Encouraged pt to bring in exact dose to next OV. Continue current supplement. Will recheck levels today.   Relevant Orders: -     VITAMIN D 25 Hydroxy (Vit-D Deficiency, Fractures) -     Vitamin B12  Follow up:   Return in about 15 days (around 12/24/2023). She was informed of the importance of frequent follow up visits to maximize her success with intensive lifestyle modifications for her multiple health conditions.  Subjective:   Chief complaint: Obesity [name] is here to discuss her progress with her obesity treatment plan. She is on the the Category 1 Plan and states she is following her eating plan approximately 0% of the time. She states she is going to [facility] 45 minutes 3 days per week.  Interval History:  [name] is here for a follow up office visit, she is accompanied by her son, [name]. Last time pt saw me was 12/01/2022. Since last OV on 08/03/2023 with William Hamburger, NP, [name] is down 8 lbs. Pt came to appointment fasting so she reports feeling shaky and faint before receiving labs. Additionally, pt endorses recently becoming a member at [facility].   Pharmacotherapy for weight loss: She is currently taking no anti-obesity medication.   Review of Systems:  Pertinent positives were addressed with patient today.  Reviewed by clinician on day of visit: allergies, medications, problem Morales, medical history, surgical history, family history, social  history, and previous encounter notes.  Weight Summary and Biometrics   Weight Lost Since Last Visit: 8lb  Weight Gained Since Last Visit: 0   Vitals Temp: 97.7 F (36.5 C) BP: (!) 85/60 Pulse Rate: 65 SpO2: 99 %   Anthropometric Measurements Height: 5\' 4"  (1.626 m) Weight: 220 lb (99.8 kg) BMI (Calculated): 37.74 Weight at Last Visit: 228lb Weight Lost Since Last Visit: 8lb Weight Gained Since Last Visit: 0 Starting Weight: 234lb Total Weight Loss (lbs): 14 lb (6.35 kg)   Body Composition  Body Fat %: 49.8 % Fat Mass (lbs): 109.8 lbs Muscle Mass (lbs): 105 lbs Total Body Water (lbs): 76.6 lbs Visceral Fat Rating : 15   Other Clinical Data Fasting: yes Labs: yes Today's Visit #: 3 Starting Date: 07/13/23    Objective:   PHYSICAL EXAM: Blood pressure (!) 85/60, pulse 65, temperature 97.7 F (36.5 C), height 5\' 4"  (1.626 m), weight 220 lb (99.8 kg), SpO2 99%. Body mass index is 37.76 kg/m.  General: she is overweight, cooperative and in no acute distress. PSYCH: Has normal mood, affect and thought process.   HEENT: EOMI, sclerae are anicteric. Lungs: Normal breathing effort, no conversational dyspnea.  Extremities: Moves * 4 Neurologic: A and O * 3, good insight  DIAGNOSTIC DATA REVIEWED: BMET    Component Value Date/Time   NA 140 09/18/2023 0929   NA 143 12/01/2022 0948   K 4.4 09/18/2023 0929   CL 102 09/18/2023 0929   CO2 29 09/18/2023 0929   GLUCOSE 84 09/18/2023 0929   BUN 13 09/18/2023 0929   BUN 5 (L) 12/01/2022 0948   CREATININE 0.59 09/18/2023 0929   CREATININE 0.55 03/21/2016 0802   CALCIUM 9.7 09/18/2023 0929   CALCIUM 9.1 08/04/2012 1652   GFRNONAA >60 08/20/2023 0900   GFRAA >60 10/28/2015 0916   Lab Results  Component Value Date   HGBA1C 6.0 09/18/2023   HGBA1C 5.8 07/01/2019   Lab Results  Component Value Date   INSULIN 10.4 12/01/2022   INSULIN 8.7 04/22/2022   Lab Results  Component Value Date   TSH 1.02  09/18/2023   CBC    Component Value Date/Time   WBC 6.7 09/18/2023 0929   RBC 4.57 09/18/2023 0929   HGB 14.4 09/18/2023 0929   HGB 13.6 12/01/2022 0948   HCT 43.2 09/18/2023 0929   HCT 41.1 12/01/2022 0948   PLT 277.0 09/18/2023 0929   PLT 230 12/01/2022 0948   MCV 94.4 09/18/2023 0929   MCV 92 12/01/2022 0948   MCH 31.2 08/20/2023 0900   MCHC 33.3 09/18/2023 0929   RDW 14.2 09/18/2023 0929   RDW 13.2 12/01/2022 0948   Iron Studies    Component Value Date/Time   IRON 69 07/01/2019 0920   IRONPCTSAT 19.3 (L) 07/01/2019 0920   Lipid Panel     Component Value Date/Time   CHOL 202 (H) 09/18/2023 0929   CHOL 186 12/01/2022 0948   TRIG 61.0 09/18/2023 0929   HDL 53.70 09/18/2023 0929   HDL 58 12/01/2022 0948   CHOLHDL 4 09/18/2023 0929   VLDL 12.2 09/18/2023 0929   LDLCALC 136 (H) 09/18/2023 0929   LDLCALC 110 (H) 12/01/2022 0948   LDLDIRECT 184.9 08/04/2012 1652   Hepatic Function Panel     Component Value Date/Time   PROT 8.1 09/18/2023 0929   PROT 6.7 12/01/2022 0948   ALBUMIN 4.5 09/18/2023 0929   ALBUMIN 4.1 12/01/2022 0948   AST 23 09/18/2023 0929   ALT 19 09/18/2023 0929   ALKPHOS 87 09/18/2023 0929   BILITOT 0.4 09/18/2023 0929   BILITOT 0.4 12/01/2022 0948   BILIDIR 0.1 09/18/2023 0929   IBILI 0.4 03/21/2016 0802      Component Value Date/Time   TSH 1.02 09/18/2023 0929   Nutritional Lab Results  Component Value Date   VD25OH 43.84 04/06/2023   VD25OH 39.3 12/01/2022   VD25OH 36.3 04/22/2022    Attestations:   I, Camryn Mix, acting as a Stage manager for [employer], DO., have compiled all relevant documentation for today's office visit on behalf of Thomasene Lot, DO, while in the presence of [employer], DO.  Reviewed by clinician on day of visit: allergies, medications, problem Morales, medical history, surgical history, family history, social history, and previous encounter notes pertinent to patient's obesity diagnosis. I have  spent 44 minutes in the care of the patient today including: preparing to see patient (e.g. review and interpretation of tests, old notes ), obtaining and/or reviewing separately obtained history, performing a medically appropriate examination or evaluation, counseling and educating the patient, ordering medications, test or procedures, documenting clinical information in the electronic or other health care record, and independently interpreting results and  communicating results to the patient, family, or caregiver   I have reviewed the above documentation for accuracy and completeness, and I agree with the above. [name], D.O.  The 21st Century Cures Act was signed into law in 2016 which includes the topic of electronic health records.  This provides immediate access to information in MyChart.  This includes consultation notes, operative notes, office notes, lab results and pathology reports.  If you have any questions about what you read please let us know at your next visit so we can discuss your concerns and take corrective action if need be.  We are right here with you.

## 2023-12-10 LAB — VITAMIN D 25 HYDROXY (VIT D DEFICIENCY, FRACTURES): Vit D, 25-Hydroxy: 34.7 ng/mL (ref 30.0–100.0)

## 2023-12-10 LAB — INSULIN, RANDOM: INSULIN: 8.6 u[IU]/mL (ref 2.6–24.9)

## 2023-12-10 LAB — HEMOGLOBIN A1C
Est. average glucose Bld gHb Est-mCnc: 123 mg/dL
Hgb A1c MFr Bld: 5.9 % — ABNORMAL HIGH (ref 4.8–5.6)

## 2023-12-10 LAB — VITAMIN B12: Vitamin B-12: 1775 pg/mL — ABNORMAL HIGH (ref 232–1245)

## 2023-12-16 DIAGNOSIS — H31003 Unspecified chorioretinal scars, bilateral: Secondary | ICD-10-CM | POA: Diagnosis not present

## 2023-12-16 DIAGNOSIS — H401132 Primary open-angle glaucoma, bilateral, moderate stage: Secondary | ICD-10-CM | POA: Diagnosis not present

## 2023-12-21 ENCOUNTER — Encounter: Payer: Self-pay | Admitting: Internal Medicine

## 2023-12-21 DIAGNOSIS — Z1231 Encounter for screening mammogram for malignant neoplasm of breast: Secondary | ICD-10-CM | POA: Diagnosis not present

## 2023-12-21 LAB — HM MAMMOGRAPHY

## 2023-12-24 ENCOUNTER — Ambulatory Visit (INDEPENDENT_AMBULATORY_CARE_PROVIDER_SITE_OTHER): Admitting: Adult Health

## 2023-12-24 ENCOUNTER — Encounter (INDEPENDENT_AMBULATORY_CARE_PROVIDER_SITE_OTHER): Payer: Self-pay | Admitting: Adult Health

## 2023-12-24 VITALS — BP 106/68 | HR 68 | Temp 97.9°F | Ht 64.0 in | Wt 216.0 lb

## 2023-12-24 DIAGNOSIS — Z6837 Body mass index (BMI) 37.0-37.9, adult: Secondary | ICD-10-CM

## 2023-12-24 DIAGNOSIS — F439 Reaction to severe stress, unspecified: Secondary | ICD-10-CM | POA: Diagnosis not present

## 2023-12-24 DIAGNOSIS — E538 Deficiency of other specified B group vitamins: Secondary | ICD-10-CM

## 2023-12-24 DIAGNOSIS — R7303 Prediabetes: Secondary | ICD-10-CM | POA: Diagnosis not present

## 2023-12-24 DIAGNOSIS — E66812 Obesity, class 2: Secondary | ICD-10-CM

## 2023-12-24 DIAGNOSIS — E559 Vitamin D deficiency, unspecified: Secondary | ICD-10-CM | POA: Diagnosis not present

## 2023-12-24 DIAGNOSIS — E669 Obesity, unspecified: Secondary | ICD-10-CM

## 2023-12-24 MED ORDER — VITAMIN D (ERGOCALCIFEROL) 1.25 MG (50000 UNIT) PO CAPS: 50000.0000 [IU] | ORAL_CAPSULE | ORAL | 0 refills | Status: DC

## 2023-12-24 NOTE — Progress Notes (Signed)
 WEIGHT SUMMARY AND BIOMETRICS  Vitals Temp: 97.9 F (36.6 C) BP: 106/68 Pulse Rate: 68 SpO2: 100 %   Anthropometric Measurements Height: 5\' 4"  (1.626 m) Weight: 216 lb (98 kg) BMI (Calculated): 37.06 Weight at Last Visit: 220 lb Weight Lost Since Last Visit: 4 lb Weight Gained Since Last Visit: 0 Starting Weight: 234 lb Total Weight Loss (lbs): 18 lb (8.165 kg)   Body Composition  Body Fat %: 49.5 % Fat Mass (lbs): 107 lbs Muscle Mass (lbs): 103.6 lbs Total Body Water (lbs): 76 lbs Visceral Fat Rating : 14   Other Clinical Data Fasting: no Labs: no Today's Visit #: 4 Starting Date: 07/13/23    Chief Complaint:   OBESITY [name] is here to discuss her progress with her obesity treatment plan.  She is on the the Category 1 Plan and states she is following her eating plan approximately 90 % of the time.  She states she is exercising [employer]/Walking/Lifting 35 minutes 3 times per week.  Interim History:  Stress- She, her husband, and adult son (age 16) moved from home and in with her mother Sold family home, closed 11/27/2023 Sale check deposited in their joint account Her husband withdrew deposited amount on/about 12/17/2023- he will not offer details as to why he moved the money  Her husband is age 29 Uncontrolled HTN, Uncontrolled T2D He refuses to take prescribed insulin- he has appt with PharmD today to discuss medical mgt  Husband has been impotent > 6 years  Subjective:   1. Vitamin B12 deficiency Discussed Labs         Vitamin B12 232 - 1,245 pg/mL 1,775 (H)  (H): Data is abnormally high  B12 over-placed She has been taking Oral B12 supplement daily  2. Vitamin D deficiency Discussed Labs  Latest Reference Range & Units 12/09/23 14:24  Vitamin D, 25-Hydroxy 30.0 - 100.0 ng/mL 34.7   She has purchased OTC Vit D supplement- has yet to start  3. Pre-diabetes Discussed Labs Lab Results  Component Value Date   HGBA1C 5.9 (H) 12/09/2023    HGBA1C 6.0 09/18/2023   HGBA1C 5.8 04/06/2023     Latest Reference Range & Units 12/01/22 09:48 12/09/23 14:24  INSULIN 2.6 - 24.9 uIU/mL 10.4 8.6   4. Stress at home Stress- She, her husband, and adult son (age 73) moved from home and in with her mother Sold family home, closed 11/27/2023 Sale check deposited in their joint account Her husband withdrew deposited amount on/about 12/17/2023- he will not offer details as to why he moved the money  Her husband is age 31 Uncontrolled HTN, Uncontrolled T2D He refuses to take prescribed insulin- he has appt with PharmD today to discuss medical mgt   Assessment/Plan:   1. Vitamin B12 deficiency Take oral B12 every OTHER day  2. Vitamin D deficiency Start Vitamin D, Ergocalciferol, (DRISDOL) 1.25 MG (50000 UNIT) CAPS capsule Take 1 capsule (50,000 Units total) by mouth every 7 (seven) days. Dispense: 4 capsule, Refills: 0 ordered   Remain off OTC Vit D supplement  3. Pre-diabetes (Primary) Increase protein and limit simple CHO/sugar Continue regular exercise  4. Stress at home F/u with Bevelyn Ngo Continue regular exercise  5. Obesity,current BMI 37.1 Continue regular exercise  [name] is currently in the action stage of change. As such, her goal is to continue with weight loss efforts. She has agreed to the Category 1 Plan.   Exercise goals: All adults should avoid inactivity. Some physical activity is  better than none, and adults who participate in any amount of physical activity gain some health benefits. Adults should also include muscle-strengthening activities that involve all major muscle groups on 2 or more days a week.  Behavioral modification strategies: increasing lean protein intake, decreasing simple carbohydrates, increasing vegetables, increasing water intake, no skipping meals, meal planning and cooking strategies, keeping healthy foods in the home, planning for success, and decreasing junk food.  Sherre has  agreed to follow-up with our clinic in 3 weeks. She was informed of the importance of frequent follow-up visits to maximize her success with intensive lifestyle modifications for her multiple health conditions.   Objective:   Blood pressure 106/68, pulse 68, temperature 97.9 F (36.6 C), height 5\' 4"  (1.626 m), weight 216 lb (98 kg), SpO2 100%. Body mass index is 37.08 kg/m.  General: Cooperative, alert, well developed, in no acute distress. HEENT: Conjunctivae and lids unremarkable. Cardiovascular: Regular rhythm.  Lungs: Normal work of breathing. Neurologic: No focal deficits.   Lab Results  Component Value Date   CREATININE 0.59 09/18/2023   BUN 13 09/18/2023   NA 140 09/18/2023   K 4.4 09/18/2023   CL 102 09/18/2023   CO2 29 09/18/2023   Lab Results  Component Value Date   ALT 19 09/18/2023   AST 23 09/18/2023   ALKPHOS 87 09/18/2023   BILITOT 0.4 09/18/2023   Lab Results  Component Value Date   HGBA1C 5.9 (H) 12/09/2023   HGBA1C 6.0 09/18/2023   HGBA1C 5.8 04/06/2023   HGBA1C 5.9 (H) 12/01/2022   HGBA1C 5.9 (H) 04/22/2022   Lab Results  Component Value Date   INSULIN 8.6 12/09/2023   INSULIN 10.4 12/01/2022   INSULIN 8.7 04/22/2022   Lab Results  Component Value Date   TSH 1.02 09/18/2023   Lab Results  Component Value Date   CHOL 202 (H) 09/18/2023   HDL 53.70 09/18/2023   LDLCALC 136 (H) 09/18/2023   LDLDIRECT 184.9 08/04/2012   TRIG 61.0 09/18/2023   CHOLHDL 4 09/18/2023   Lab Results  Component Value Date   VD25OH 34.7 12/09/2023   VD25OH 43.84 04/06/2023   VD25OH 39.3 12/01/2022   Lab Results  Component Value Date   WBC 6.7 09/18/2023   HGB 14.4 09/18/2023   HCT 43.2 09/18/2023   MCV 94.4 09/18/2023   PLT 277.0 09/18/2023   Lab Results  Component Value Date   IRON 69 07/01/2019   Attestation Statements:   Reviewed by clinician on day of visit: allergies, medications, problem list, medical history, surgical history, family  history, social history, and previous encounter notes.  I have reviewed the above documentation for accuracy and completeness, and I agree with the above. -  Barbaraann Avans d. Chinelo Benn, NP-C

## 2024-01-05 DIAGNOSIS — E785 Hyperlipidemia, unspecified: Secondary | ICD-10-CM | POA: Diagnosis not present

## 2024-01-05 DIAGNOSIS — E7800 Pure hypercholesterolemia, unspecified: Secondary | ICD-10-CM | POA: Diagnosis not present

## 2024-01-06 LAB — NMR, LIPOPROFILE
Cholesterol, Total: 156 mg/dL (ref 100–199)
HDL Particle Number: 33.8 umol/L (ref >=30.5)
HDL-C: 56 mg/dL (ref >39)
LDL Particle Number: 1101 nmol/L — ABNORMAL HIGH (ref <1000)
LDL Size: 20.9 nm (ref >20.5)
LDL-C (NIH Calc): 86 mg/dL (ref 0–99)
LP-IR Score: 36 (ref <=45)
Small LDL Particle Number: 558 nmol/L — ABNORMAL HIGH (ref <=527)
Triglycerides: 74 mg/dL (ref 0–149)

## 2024-01-06 LAB — LIPOPROTEIN A (LPA): Lipoprotein (a): 74.8 nmol/L (ref <75.0)

## 2024-01-07 ENCOUNTER — Ambulatory Visit (INDEPENDENT_AMBULATORY_CARE_PROVIDER_SITE_OTHER): Admitting: Family Medicine

## 2024-01-07 ENCOUNTER — Encounter (INDEPENDENT_AMBULATORY_CARE_PROVIDER_SITE_OTHER): Payer: Self-pay | Admitting: Family Medicine

## 2024-01-07 VITALS — BP 110/70 | HR 71 | Temp 98.7°F | Ht 64.0 in | Wt 213.0 lb

## 2024-01-07 DIAGNOSIS — E538 Deficiency of other specified B group vitamins: Secondary | ICD-10-CM | POA: Diagnosis not present

## 2024-01-07 DIAGNOSIS — R7303 Prediabetes: Secondary | ICD-10-CM | POA: Diagnosis not present

## 2024-01-07 DIAGNOSIS — E559 Vitamin D deficiency, unspecified: Secondary | ICD-10-CM | POA: Diagnosis not present

## 2024-01-07 DIAGNOSIS — Z6836 Body mass index (BMI) 36.0-36.9, adult: Secondary | ICD-10-CM | POA: Diagnosis not present

## 2024-01-07 MED ORDER — VITAMIN D (ERGOCALCIFEROL) 1.25 MG (50000 UNIT) PO CAPS: 50000.0000 [IU] | ORAL_CAPSULE | ORAL | 0 refills | Status: DC

## 2024-01-07 NOTE — Progress Notes (Signed)
 Rae Bugler, D.O.  ABFM, ABOM Specializing in Clinical Bariatric Medicine  Office located at: [address] [place], [place]  [address]   Assessment and Plan:   Medications Discontinued During This Encounter  Medication Reason   Vitamin D , Ergocalciferol , (DRISDOL ) 1.25 MG (50000 UNIT) CAPS capsule Reorder     Meds ordered this encounter  Medications   Vitamin D , Ergocalciferol , (DRISDOL ) 1.25 MG (50000 UNIT) CAPS capsule    Sig: Take 1 capsule (50,000 Units total) by mouth every 7 (seven) days.    Dispense:  12 capsule    Refill:  0    FOR THE DISEASE OF OBESITY:  Obesity,current BMI 36.54 Morbid obesity (HCC)-RESTART DATE 07/13/2023 BMI 40.15 Assessment & Plan: Since last office visit on 12/24/2023 patient's  Muscle mass has decreased by 0.6 lb. Fat mass has decreased by 1.2 lb. Total body water has increased by 3.2 lb.  Counseling done on how various foods will affect these numbers and how to maximize success  Total lbs lost to date: 21 lbs  Total Tammy loss percentage to date: 8.97%    Recommended Dietary Goals [name] is currently in the action stage of change. As such, her goal is to continue Tammy management plan.  She has agreed to: continue current plan   Behavioral Intervention We discussed the following today: avoiding skipping meals, increasing water intake , work on managing stress, creating time for self-care and relaxation, and continue to practice mindfulness when eating  Additional resources provided today: None  Evidence-based interventions for Morales behavior change were utilized today including the discussion of self monitoring techniques, problem-solving barriers and SMART goal setting techniques.   Regarding patient's less desirable eating habits and patterns, we employed the technique of small changes.   Pt will specifically work on: n/a   Recommended Physical Activity Goals [name], Tammy loss maintenance and preservation of muscle mass.   She has agreed to : continue to gradually increase the amount and intensity of exercise routine- start resistance training   Pharmacotherapy We both agreed to : continue with nutritional and behavioral strategies    ASSOCIATED CONDITIONS ADDRESSED TODAY: ** Pt seen by Acie Holiday last OV and they reviewed labs from 12/09/23; which pt asked me to review with her today as well.    Pre-diabetes Assessment & Plan: A1c slightly improved from prior.  Diet/lifestyle approach, no meds for this condition.  No c/o hunger and cravings today.  Most recent A1c and fasting insulin  reviewed with pt and counseling done: Lab Results  Component Value Date   HGBA1C 5.9 (H) 12/09/2023   HGBA1C 6.0 09/18/2023   HGBA1C 5.8 04/06/2023   INSULIN  8.6 12/09/2023   INSULIN  10.4 12/01/2022   INSULIN  8.7 04/22/2022    Continue working on nutrition plan to decrease simple carbohydrates, increase lean proteins and exercise to promote Tammy loss and improve glycemic control and prevent progression to T2DM.   Vitamin D  deficiency Assessment & Plan: Prior to labs on 12/09/23, pt reports sporadically taking OTC Vitamin D  3000 international units daily. Once she met with my colleague Acie Holiday on 4/17, vit d level only 34.7 and pt was instructed to restart ERGO 50,000 units once weekly and hold off on any OTC Vit D supplement. Has been compliant and tolerant with the prescription strength vitamin D . Most recent VD: Lab Results  Component Value Date   VD25OH 34.7 12/09/2023   VD25OH 43.84 04/06/2023   VD25OH 39.3 12/01/2022   Cont wkly supp.   RF given.  Recheck levels in 3 months approx.  Counseled on vit D insufficiency and it's deleterious effects on wt loss journey and possible increased risk for disease states     Vitamin B12 deficiency Pt has  been on b12 supplements for years now.   Not sure of dose. Last time was over supplemented with level of 1775 on 12/09/23 and told to decrease dose in half by [facility].   Pt claims she has done so.  No S-E or concerns  Continue b12 half dose of suppplement and cont PNP.  Will folllow   Follow up:   Return 02/17/2024 at 11:30 AM with [place], Evangelina Hilt, PA-C. She was informed of the importance of frequent follow up visits to maximize her success with intensive lifestyle modifications for her multiple Morales conditions.  Subjective:   Chief complaint: Obesity [name] is here to discuss her progress with her obesity treatment plan. She is on the Category 1 Plan and states she is following her eating plan approximately 80% of the time. She states she is going to the [employer] [facility] [employer] and doing Tammy training + using the treadmill 60 minutes 5 days per week.  Interval History:  [name] is here for a follow up office visit. Since last OV on 12/24/2023, [name] is down 3 lbs. States she is eating healthy but is having a difficult time getting in all her meal plan foods. Is drinking 3 bottles of water daily. Has been stressed by her husband because he refuses prescribed insulin  despite having uncontrolled T2DM. Has been meditating 15 minutes daily and exercising as an outlet.   Pharmacotherapy that aid with Tammy loss: none  Review of Systems:  Pertinent positives were addressed with patient today.Reviewed by clinician on day of visit: allergies, medications, problem list, medical history, surgical history, family history, social history, and previous encounter notes.  Tammy Summary and Biometrics   Tammy Lost Since Last Visit: 3lb  Tammy Gained Since Last Visit: 0lb   Vitals Temp: 98.7 F (37.1 C) BP: 110/70 Pulse Rate: 71 SpO2: 100 %   Anthropometric Measurements Height: 5\' 4"  (1.626 m) Tammy: 213 lb (96.6 kg) BMI (Calculated): 36.54 Tammy at Last Visit: 216lb Tammy Lost Since  Last Visit: 3lb Tammy Gained Since Last Visit: 0lb Starting Tammy: 234lb Total Tammy Loss (lbs): 21 lb (9.526 kg)   Body Composition  Body Fat %: 49.6 % Fat Mass (lbs): 105.8 lbs Muscle Mass (lbs): 102.2 lbs Total Body Water (lbs): 79.2 lbs Visceral Fat Rating : 14   Other Clinical Data Fasting: No Labs: No Today's Visit #: 5 Starting Date: 07/13/23   Objective:   PHYSICAL EXAM: Blood pressure 110/70, pulse 71, temperature 98.7 F (37.1 C), height 5\' 4"  (1.626 m), Tammy 213 lb (96.6 kg), SpO2 100%. Body mass index is 36.56 kg/m.  General: she is overweight, cooperative and in no acute distress. PSYCH: Has normal mood, affect and thought process.   HEENT: EOMI, sclerae are anicteric. Lungs: Normal breathing effort, no conversational dyspnea. Extremities: Moves * 4 Neurologic: A and O * 3, good insight  DIAGNOSTIC DATA REVIEWED: BMET    Component Value Date/Time   NA 140 09/18/2023 0929   NA 143 12/01/2022 0948   K 4.4 09/18/2023 0929   CL 102 09/18/2023 0929   CO2 29 09/18/2023 0929   GLUCOSE 84 09/18/2023 0929  BUN 13 09/18/2023 0929   BUN 5 (L) 12/01/2022 0948   CREATININE 0.59 09/18/2023 0929   CREATININE 0.55 03/21/2016 0802   CALCIUM  9.7 09/18/2023 0929   CALCIUM  9.1 08/04/2012 1652   GFRNONAA >60 08/20/2023 0900   GFRAA >60 10/28/2015 0916   Lab Results  Component Value Date   HGBA1C 5.9 (H) 12/09/2023   HGBA1C 5.8 07/01/2019   Lab Results  Component Value Date   INSULIN  8.6 12/09/2023   INSULIN  8.7 04/22/2022   Lab Results  Component Value Date   TSH 1.02 09/18/2023   CBC    Component Value Date/Time   WBC 6.7 09/18/2023 0929   RBC 4.57 09/18/2023 0929   HGB 14.4 09/18/2023 0929   HGB 13.6 12/01/2022 0948   HCT 43.2 09/18/2023 0929   HCT 41.1 12/01/2022 0948   PLT 277.0 09/18/2023 0929   PLT 230 12/01/2022 0948   MCV 94.4 09/18/2023 0929   MCV 92 12/01/2022 0948   MCH 31.2 08/20/2023 0900   MCHC 33.3 09/18/2023 0929   RDW  14.2 09/18/2023 0929   RDW 13.2 12/01/2022 0948   Iron Studies    Component Value Date/Time   IRON 69 07/01/2019 0920   IRONPCTSAT 19.3 (L) 07/01/2019 0920   Lipid Panel     Component Value Date/Time   CHOL 202 (H) 09/18/2023 0929   CHOL 186 12/01/2022 0948   TRIG 61.0 09/18/2023 0929   HDL 53.70 09/18/2023 0929   HDL 58 12/01/2022 0948   CHOLHDL 4 09/18/2023 0929   VLDL 12.2 09/18/2023 0929   LDLCALC 136 (H) 09/18/2023 0929   LDLCALC 110 (H) 12/01/2022 0948   LDLDIRECT 184.9 08/04/2012 1652   Hepatic Function Panel     Component Value Date/Time   PROT 8.1 09/18/2023 0929   PROT 6.7 12/01/2022 0948   ALBUMIN 4.5 09/18/2023 0929   ALBUMIN 4.1 12/01/2022 0948   AST 23 09/18/2023 0929   ALT 19 09/18/2023 0929   ALKPHOS 87 09/18/2023 0929   BILITOT 0.4 09/18/2023 0929   BILITOT 0.4 12/01/2022 0948   BILIDIR 0.1 09/18/2023 0929   IBILI 0.4 03/21/2016 0802      Component Value Date/Time   TSH 1.02 09/18/2023 0929   Nutritional Lab Results  Component Value Date   VD25OH 34.7 12/09/2023   VD25OH 43.84 04/06/2023   VD25OH 39.3 12/01/2022    Attestations:   I, Special Puri , acting as a Stage manager for [employer], DO., have compiled all relevant documentation for today's office visit on behalf of Tammy Sensor, DO, while in the presence of [employer], DO.  I have reviewed the above documentation for accuracy and completeness, and I agree with the above. Rae Bugler, D.O.  The 21st Century Cures Act was signed into law in 2016 which includes the topic of electronic Morales records.  This provides immediate access to information in MyChart.  This includes consultation notes, operative notes, office notes, lab results and pathology reports.  If you have any questions about what you read please let us  know at your next visit so we can discuss your concerns and take corrective action if need be.  We are right here with you.

## 2024-01-14 ENCOUNTER — Ambulatory Visit: Payer: PRIVATE HEALTH INSURANCE | Attending: Internal Medicine | Admitting: Internal Medicine

## 2024-01-14 ENCOUNTER — Encounter: Payer: Self-pay | Admitting: Internal Medicine

## 2024-01-14 VITALS — BP 110/70 | HR 62 | Ht 63.5 in | Wt 217.6 lb

## 2024-01-14 DIAGNOSIS — Z6837 Body mass index (BMI) 37.0-37.9, adult: Secondary | ICD-10-CM

## 2024-01-14 DIAGNOSIS — I2510 Atherosclerotic heart disease of native coronary artery without angina pectoris: Secondary | ICD-10-CM

## 2024-01-14 DIAGNOSIS — E785 Hyperlipidemia, unspecified: Secondary | ICD-10-CM

## 2024-01-14 DIAGNOSIS — E66812 Obesity, class 2: Secondary | ICD-10-CM

## 2024-01-14 NOTE — Progress Notes (Signed)
 OFFICE NOTE  Chief Complaint:  Leg aches and cramps  Primary Care Physician: Roslyn Coombe, MD  HPI:  [name] is a pleasant 58 year old female who has been suffering with symptoms of chest pain and palpitations for quite some time. Apparently in 2008 she had an episode of chest pain and was admitted to the hospital. She was treated and ruled out and continued to have very intermittent episodes of discomfort over the years. Recently she had some worsening symptoms including chest discomfort and feeling of palpitations. This is followed by significant fatigue and lack of energy for several days after the palpitations have resolved. It is associated with heaviness in the chest that is behind the sternum. It does not radiate to the arm or to the jaw. Sometimes the symptoms are worse when she is exerting herself and improve at rest. Other times they occur at rest. She had recently seen Dr. Glena Landau, for a cardiac opinion. He ordered a stress test which was performed at [facility]. 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 No evidence for inducible ischemia. Normal function with an EF of 66%".  This was interpreted by a Engineer, technical sales.  Apparently she received a call saying her stress test was negative and she was told to followup with him in the office in 3 weeks. When she came back she was allegedly told that her stress test showed a prior heart attack. There does however appear to be conflicting data. Based on this she is seeking another opinion, especially in light of recurrent symptoms. She does report that the palpitation episodes are actually fairly infrequent. She says she's had about 3 episodes of this year and perhaps 2 or 3 episodes last year.  [name] returns today for follow-up. After undergoing noninvasive testing which was  equivocal, there was still some concern for possible coronary ischemia. She underwent definitive cardiac catheterization by myself which demonstrated normal coronary arteries. Based on these findings I'm not suspicious that her chest pain is cardiac. Additionally, she has few risk factors for variant angina or small vessel disease. She does have migraine headaches which could put her at risk for coronary spasm. Treatments for possible coronary spasm include beta blockers, calcium  channel blockers or nitrates. She seems to have had problems with low blood pressure in the past and I did not think will tolerate these medicines. She was started on low-dose propranolol  for migraines and she said she only took 1 pill and felt horrible with it. Therefore, I do not feel she'll tolerate a beta blocker. Her discomfort in the chest is not lifestyle or activity limiting.  01/22/2016  [name] was seen today in the office. She was sent back for follow-up due to recent ER visit where she was shown to have artery artery calcification on her CT however her cardiac catheterization 2015 showed angiographically normal coronaries. 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 Recently she had reassessment of her cholesterol by her primary care provider. She had been intolerant of statin therapy, in fact had not tolerated both Lipitor and lovastatin  in the past. Her total cholesterol was 305, triglycerides 61, HDL 51 and LDL 241. 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 There is a history of coronary disease in her family. She has  early coronary artery calcifications at age 54 which is considered premature coronary artery disease although she did not have significant obstruction she will need aggressive lipid therapy. Recently she was started on Crestor  20 mg by primary care provider but had side effects from this which  went away after being off of it for a week. She was then switched to simvastatin  20 mg which so far she is tolerating. I did discuss the lipid lowering properties of simvastatin  the fact that she needs more aggressive lowering of LDL cholesterol with a goal LDL of at least 70 if not 50 based on new guidelines, which she will never reached on statin therapy alone. Currently she is on max tolerated statin therapy. I like to add said he had 10 mg to her regimen. We'll also refer her to our lipid clinic as she is a good candidate for the PCSK9 inhibitor Repatha. Her calculated DUTCH score is 6 which is suggestive of FH. She also reports some daytime fatigue, somnolence and difficulty sleeping at night. She's been noted to awaken suddenly with gasping concerning for possible sleep apnea.  03/24/2016  [name] is seen back today in the office. She's had a marked improved prove minute and her cholesterol on Zocor  and that he had. Amazingly she's had more than a 50% reduction was suggested there may been a dietary component as well. Although it looks like she may have FH, her cholesterol is fairly well-controlled on her current regimen and therefore do not believe we need to pursue Repatha at this time. Recently she had her sleep study interpreted which indicated no evidence of obstructive sleep apnea. This is reassuring. She continues to exercise but is not seen a major weight reduction. Of encouraged her to keep that up because she is making some progress with regards to her health without question.  11/13/2016  [name] returns today for follow-up. Overall she seems to be doing well. She denies any chest pain or worsening shortness of breath. She is only on monotherapy simvastatin . What tried her on that area but she reported intolerance to that. She is due for repeat lipid profile. Unfortunately this been re-gain of weight from 205 up to 213 pounds. She says she is to start working at that heart her with more  exercise and dietary changes. She is also complaining of some left neck and left arm pain which is getting somewhat better but it's difficult for her to move her neck to the left.  05/17/2020  Ms. Depolo is seen today in follow-up.  She continues to do well.  She denies any chest pain or worsening shortness of breath.  She had no coronary disease by heart catheterization performed in 2015.  She was noted however to have some coronary calcification on CT in 2017.  Weight has continued to go up.  Initially she was around 205, then 213 and now 230 pounds.  She reports less activity.  She says that she does get short of breath walking up stairs and doing some activities which I think is weight related.  She not had recent labs since October at which time her lipids were still elevated with total cholesterol of 211, HDL 61, LDL 133 and triglycerides 85.    12/05/2021  Ms. Calabro returns today for follow-up.  She repeats feeling some fatigue throughout the day.  Blood pressure was noted to be low at 100/78 today.  I am concerned about the possibility of sleep apnea given her morbid obesity.  We did discuss some of the associated conditions and symptoms today.  She filled out a sleepiness score which actually was 0.  That being said, she does get a nonrestorative sleep and is often up at night.  She feels tired the next day.  09/15/2023  Ms. Hildreth is seen today in follow-up.  Overall she says she is feeling well.  She recently was in the ER and had side effects related to Reglan .  This has been listed as an intolerance.  Her cholesterol had been running up somewhat.  She attributed this to dietary discretions over the past year.  When she saw her primary care provider apparently she was given rosuvastatin  10 mg daily but was ready on simvastatin /ezetimibe .  Subsequently this prescription ran out but she had 40 mg simvastatin  at home and she continued to take both the simvastatin  and rosuvastatin .  She has noticed  worsening leg pains, cramping and discomfort and feeling "older" than she felt she should since this time.  01/14/2024  Ms. Verderosa is seen today in follow-up.  She continues to do well.  She is working with a healthy weight and wellness center on weight loss.  She is exercising nearly every day.  She has been on rosuvastatin  40 mg daily and seems to be tolerating much better than simvastatin .  Her lipids also are much lower.  LDL particle number now 1101, LDL 86, HDL 51 and triglycerides 74.  Her small LDL particle number is 558.  Although this is still somewhat elevated I suspect with further weight loss and dietary changes her numbers will improve.  Her LP(a) was top normal at 74.8 nmol/L  PMHx:  Past Medical History:  Diagnosis Date   ANXIETY 03/27/2010   BACK PAIN, CHRONIC 03/27/2010   Classic migraine with aura 05/30/2015   COMMON MIGRAINE 03/27/2010   DISC DISEASE, CERVICAL 03/27/2010   FATIGUE 03/27/2010   GERD 03/27/2010   Glaucoma    bilateral- on meds (eye drops)   HYPERLIPIDEMIA 03/27/2010   on meds   Multiple sclerosis (HCC) 10/23/2010   unconfirmed, but suspected   Myocardial infarction (HCC)    Obesity    Osteoporosis 09/24/2012   on meds   Palpitations    Seasonal allergies    Unspecified vitamin D  deficiency 10/23/2010    Past Surgical History:  Procedure Laterality Date   ABDOMINAL HYSTERECTOMY     partial-uterus removed   LEFT HEART CATHETERIZATION WITH CORONARY ANGIOGRAM N/A 03/31/2014   Procedure: LEFT HEART CATHETERIZATION WITH CORONARY ANGIOGRAM;  Surgeon: Hazle Lites, MD;  Location: [facility] CATH LAB;  Service: Cardiovascular;  Laterality: N/A;   Neg stress test  09/08/2005   s/p eye surgury     s/p knee surgury     WISDOM TOOTH EXTRACTION      FAMHx:  Family History  Problem Relation Age of Onset   Cancer Mother        breast   Hyperlipidemia Mother    Colon polyps Mother    Heart disease Father    Diabetes Father    Hyperlipidemia Father     Hypertension Father    Colon polyps Father    Kidney disease Father    Thyroid  disease Father    Lupus Maternal Aunt    Lupus Cousin        mother and father's side   Diabetes Other    Arthritis Other    Dementia Other    Cancer Other        2  uncles with lung cancer   Heart disease Other        2 uncles with heart disease   Colon cancer Neg Hx    Esophageal cancer Neg Hx    Stomach cancer Neg Hx    Rectal cancer Neg Hx     SOCHx:   reports that she has never smoked. She has never used smokeless tobacco. She reports that she does not drink alcohol and does not use drugs.  ALLERGIES:  Allergies  Allergen Reactions   Reglan  [Metoclopramide ] Palpitations    Felt heart beating fast and chest pressure after medication administration   Topamax Other (See Comments)    dizziness    ROS: Pertinent items noted in HPI and remainder of comprehensive ROS otherwise negative.  HOME MEDS: Current Outpatient Medications  Medication Sig Dispense Refill   alendronate  (FOSAMAX ) 70 MG tablet Take 1 tablet (70 mg total) by mouth once a week. Take with a full glass of water on an empty stomach. 13 tablet 3   aspirin  81 MG EC tablet Take 1 tablet (81 mg total) by mouth daily. Swallow whole. 30 tablet 12   brimonidine  (ALPHAGAN ) 0.2 % ophthalmic solution Place 1 drop into both eyes 3 (three) times daily.     Efinaconazole  10 % SOLN Apply 1 drop topically daily. 4 mL 11   meloxicam  (MOBIC ) 15 MG tablet Take 15 mg by mouth 3 (three) times daily.     rosuvastatin  (CRESTOR ) 40 MG tablet Take 1 tablet (40 mg total) by mouth daily. 90 tablet 3   timolol  (TIMOPTIC ) 0.5 % ophthalmic solution 1 drop 2 (two) times daily.     Vitamin D , Ergocalciferol , (DRISDOL ) 1.25 MG (50000 UNIT) CAPS capsule Take 1 capsule (50,000 Units total) by mouth every 7 (seven) days. 12 capsule 0   No current facility-administered medications for this visit.    LABS/IMAGING: No results found for this or any previous visit  (from the past 48 hours). No results found.  VITALS: BP 110/70 (BP Location: Right Arm, Patient Position: Sitting, Cuff Size: Large)   Pulse 62   Ht 5' 3.5" (1.613 m)   Wt 217 lb 9.6 oz (98.7 kg)   SpO2 96%   BMI 37.94 kg/m   EXAM: General appearance: alert, no distress and morbidly obese Neck: no carotid bruit, no JVD and thyroid  not enlarged, symmetric, no tenderness/mass/nodules Lungs: clear to auscultation bilaterally Heart: regular rate and rhythm, S1, S2 normal, no murmur, click, rub or gallop Abdomen: soft, non-tender; bowel sounds normal; no masses,  no organomegaly Extremities: extremities normal, atraumatic, no cyanosis or edema Pulses: 2+ and symmetric Skin: Skin color, texture, turgor normal. No rashes or lesions Neurologic: Grossly normal Psych: Pleasant  EKG: Deferred  ASSESSMENT: History of chest pain and palpitations - normal coronary arteries by catheterization by myself (2015) Coronary artery calcification on CT scan (2017) Possible history of multiple sclerosis Mixed dyslipidemia, goal LDL <70 Negative LP(a)  PLAN: 1.   Ms. Lal is tolerating high-dose rosuvastatin  40 mg daily.  Her lipids are much closer to target.  She is losing weight and exercising regularly.  I suspect she will likely reach those targets with lifestyle in addition to the statin.  She denies any chest pain or palpitations.  Blood pressure appears well-controlled.  No changes to her meds today.  Will plan to repeat NMR in about 6 months and she can follow-up with me at that time.  Hazle Lites, MD, [facility], FNLA, [facility]  [facility] of [facility] Lipid Disorders &  Cardiovascular Risk Reduction [facility] Diplomate of the American Board of Clinical Lipidology Attending Cardiologist  Direct Dial: [phone]  Fax: [phone]  Website:  [email or web address]   Aviva Lemmings Elma Shands 01/14/2024, 10:17 AM

## 2024-01-14 NOTE — Patient Instructions (Signed)
 Medication Instructions:  NO CHANGES  *If you need a refill on your cardiac medications before your next appointment, please call your pharmacy*  Lab Work: FASTING lab work in 6 months   If you have labs (blood work) drawn today and your tests are completely normal, you will receive your results only by: [employer] (if you have MyChart) OR A paper copy in the mail If you have any lab test that is abnormal or we need to change your treatment, we will call you to review the results.  Follow-Up: At [facility], you and your health needs are our priority.  As part of our continuing mission to provide you with exceptional heart care, our providers are all part of one team.  This team includes your primary Cardiologist (physician) and Advanced Practice Providers or APPs (Physician Assistants and Nurse Practitioners) who all work together to provide you with the care you need, when you need it.  Your next appointment:    6 months with Dr. Maximo Spar  ** call in July/August for this appointment   We recommend signing up for the patient portal called "MyChart".  Sign up information is provided on this After Visit Summary.  MyChart is used to connect with patients for Virtual Visits (Telemedicine).  Patients are able to view lab/test results, encounter notes, upcoming appointments, etc.  Non-urgent messages can be sent to your provider as well.   To learn more about what you can do with MyChart, go to [email or web address].

## 2024-02-17 ENCOUNTER — Ambulatory Visit (INDEPENDENT_AMBULATORY_CARE_PROVIDER_SITE_OTHER): Admitting: Physician Assistant

## 2024-03-09 ENCOUNTER — Ambulatory Visit (INDEPENDENT_AMBULATORY_CARE_PROVIDER_SITE_OTHER): Admitting: Family Medicine

## 2024-03-18 ENCOUNTER — Ambulatory Visit: Payer: PRIVATE HEALTH INSURANCE | Admitting: Internal Medicine

## 2024-03-24 ENCOUNTER — Ambulatory Visit (INDEPENDENT_AMBULATORY_CARE_PROVIDER_SITE_OTHER): Admitting: Family Medicine

## 2024-04-14 ENCOUNTER — Ambulatory Visit (INDEPENDENT_AMBULATORY_CARE_PROVIDER_SITE_OTHER): Admitting: Family Medicine

## 2024-04-18 ENCOUNTER — Encounter: Payer: Self-pay | Admitting: Internal Medicine

## 2024-04-19 NOTE — Telephone Encounter (Signed)
 Since this was a one time situational reaction, we can continue current treatment, and consider d/w anxiety if recurs by next visit.   Thanks

## 2024-05-25 ENCOUNTER — Encounter (HOSPITAL_COMMUNITY): Payer: Self-pay

## 2024-05-25 ENCOUNTER — Emergency Department (HOSPITAL_COMMUNITY): Payer: PRIVATE HEALTH INSURANCE

## 2024-05-25 ENCOUNTER — Emergency Department (HOSPITAL_COMMUNITY)
Admission: EM | Admit: 2024-05-25 | Discharge: 2024-05-25 | Disposition: A | Discharge status: Home / Self Care | Payer: PRIVATE HEALTH INSURANCE | Attending: Emergency Medicine | Admitting: Emergency Medicine

## 2024-05-25 DIAGNOSIS — M542 Cervicalgia: Secondary | ICD-10-CM | POA: Diagnosis not present

## 2024-05-25 DIAGNOSIS — R0602 Shortness of breath: Secondary | ICD-10-CM | POA: Insufficient documentation

## 2024-05-25 DIAGNOSIS — Z955 Presence of coronary angioplasty implant and graft: Secondary | ICD-10-CM | POA: Insufficient documentation

## 2024-05-25 DIAGNOSIS — R109 Unspecified abdominal pain: Secondary | ICD-10-CM | POA: Insufficient documentation

## 2024-05-25 DIAGNOSIS — I2510 Atherosclerotic heart disease of native coronary artery without angina pectoris: Secondary | ICD-10-CM | POA: Diagnosis not present

## 2024-05-25 DIAGNOSIS — M549 Dorsalgia, unspecified: Secondary | ICD-10-CM | POA: Diagnosis not present

## 2024-05-25 DIAGNOSIS — I700 Atherosclerosis of aorta: Secondary | ICD-10-CM | POA: Diagnosis not present

## 2024-05-25 DIAGNOSIS — R9431 Abnormal electrocardiogram [ECG] [EKG]: Secondary | ICD-10-CM | POA: Diagnosis not present

## 2024-05-25 DIAGNOSIS — K4020 Bilateral inguinal hernia, without obstruction or gangrene, not specified as recurrent: Secondary | ICD-10-CM | POA: Diagnosis not present

## 2024-05-25 LAB — CBC
HCT: 41.5 % (ref 36.0–46.0)
Hemoglobin: 13.1 g/dL (ref 12.0–15.0)
MCH: 30.1 pg (ref 26.0–34.0)
MCHC: 31.6 g/dL (ref 30.0–36.0)
MCV: 95.4 fL (ref 80.0–100.0)
Platelets: 266 K/uL (ref 150–400)
RBC: 4.35 MIL/uL (ref 3.87–5.11)
RDW: 14.6 % (ref 11.5–15.5)
WBC: 8.2 K/uL (ref 4.0–10.5)
nRBC: 0 % (ref 0.0–0.2)

## 2024-05-25 LAB — PRO BRAIN NATRIURETIC PEPTIDE: Pro Brain Natriuretic Peptide: <50 pg/mL (ref <300.0)

## 2024-05-25 LAB — BASIC METABOLIC PANEL WITH GFR
Anion gap: 11 (ref 5–15)
BUN: 10 mg/dL (ref 6–20)
CO2: 24 mmol/L (ref 22–32)
Calcium: 9.4 mg/dL (ref 8.9–10.3)
Chloride: 105 mmol/L (ref 98–111)
Creatinine, Ser: 0.53 mg/dL (ref 0.44–1.00)
GFR, Estimated: >60 mL/min (ref >60)
Glucose, Bld: 111 mg/dL — ABNORMAL HIGH (ref 70–99)
Potassium: 4.4 mmol/L (ref 3.5–5.1)
Sodium: 139 mmol/L (ref 135–145)

## 2024-05-25 LAB — TROPONIN T, HIGH SENSITIVITY: Troponin T High Sensitivity: <15 ng/L (ref 0–19)

## 2024-05-25 LAB — D-DIMER, QUANTITATIVE: D-Dimer, Quant: 0.57 ug{FEU}/mL — ABNORMAL HIGH (ref 0.00–0.50)

## 2024-05-25 MED ORDER — IOHEXOL 350 MG/ML SOLN
80.0000 mL | Freq: Once | INTRAVENOUS | Status: AC | PRN
  Administered 2024-05-25: 80 mL via INTRAVENOUS

## 2024-05-25 MED ORDER — NAPROXEN 500 MG PO TABS
500.0000 mg | ORAL_TABLET | Freq: Once | ORAL | Status: AC
  Administered 2024-05-25: 500 mg via ORAL
  Filled 2024-05-25: qty 1

## 2024-05-25 MED ORDER — NAPROXEN 500 MG PO TABS: 500.0000 mg | ORAL_TABLET | Freq: Two times a day (BID) | ORAL | 0 refills | Status: AC

## 2024-05-25 MED ORDER — METHOCARBAMOL 500 MG PO TABS
1000.0000 mg | ORAL_TABLET | Freq: Once | ORAL | Status: AC
  Administered 2024-05-25: 1000 mg via ORAL
  Filled 2024-05-25: qty 2

## 2024-05-25 MED ORDER — ONDANSETRON HCL 4 MG/2ML IJ SOLN
4.0000 mg | Freq: Once | INTRAMUSCULAR | Status: AC
Start: 2024-05-25 — End: 2024-05-25
  Administered 2024-05-25: 4 mg via INTRAVENOUS
  Filled 2024-05-25: qty 2

## 2024-05-25 MED ORDER — OXYCODONE HCL 5 MG PO TABS: 5.0000 mg | ORAL_TABLET | Freq: Once | ORAL | Status: DC

## 2024-05-25 MED ORDER — MORPHINE SULFATE (PF) 4 MG/ML IV SOLN: 4.0000 mg | Freq: Once | INTRAVENOUS | Status: DC

## 2024-05-25 MED ORDER — MORPHINE SULFATE (PF) 4 MG/ML IV SOLN
4.0000 mg | Freq: Once | INTRAVENOUS | Status: AC
  Administered 2024-05-25: 4 mg via INTRAVENOUS
  Filled 2024-05-25: qty 1

## 2024-05-25 MED ORDER — PREDNISONE 10 MG PO TABS: 20.0000 mg | ORAL_TABLET | Freq: Every day | ORAL | 0 refills | Status: AC

## 2024-05-25 MED ORDER — METHOCARBAMOL 500 MG PO TABS: 500.0000 mg | ORAL_TABLET | Freq: Three times a day (TID) | ORAL | 0 refills | Status: AC | PRN

## 2024-05-25 NOTE — ED Notes (Signed)
 Pt states she is unable to lay flat for the CT scan. Meds offered and pt refusing, pt states she does not like how it makes her feel. This nurse advised I could ask about other pain med options and pt states she does not want anything else because she does not like how they make her feel, she states the MS did help with the pain for a little bit but it did make her feel weird but that feeling went away. Tried to express to pt the importance of getting this CT scan. Son expressed that he felt his mother did not need the scan. Ben from CT came and talked to pt and tried to explain to pt how they could try to make her more comfortable in the scan and position her and how long the scan would take. We explained we could place the pt on oxygen since she felt sob when she laid down to help with the sob. They could raise her head some to also help with the pain and sob. Pt stated she would think about it and let us  know. It was again expressed to pt the importance of this CT scan to pt and son. Provider notified.

## 2024-05-25 NOTE — ED Notes (Addendum)
 Patient states she has had some shortness of breath, unable to sleep laying down and currently sits up with pillow behind back because if she sits back continues to have shortness of breath.  Patient does have a previous cardiac history.  Patient denies cough, fever or chills.  O2 sat is 99% on RA.  Family member in room at present time. Neck pain radiates across shoulders and is mostly right sided neck pain

## 2024-05-25 NOTE — ED Provider Notes (Signed)
 [facility]-EMERGENCY DEPT [facility] Emergency Department Provider Note MRN:  [id number]  Arrival date & time: 05/25/24     Chief Complaint   Neck Pain   History of Present Illness   [name] is a 58 y.o. year-old female with a history of MI presenting to the ED with chief complaint of neck pain.  Pain to the back of the neck, saw it was a crick in the neck when she woke up but the pain seems to be spreading.  Worse when laying flat, gets short of breath when laying flat.  Review of Systems  A thorough review of systems was obtained and all systems are negative except as noted in the HPI and PMH.   Patient's Health History    Past Medical History:  Diagnosis Date   ANXIETY 03/27/2010   BACK PAIN, CHRONIC 03/27/2010   Classic migraine with aura 05/30/2015   COMMON MIGRAINE 03/27/2010   DISC DISEASE, CERVICAL 03/27/2010   FATIGUE 03/27/2010   GERD 03/27/2010   Glaucoma    bilateral- on meds (eye drops)   HYPERLIPIDEMIA 03/27/2010   on meds   Multiple sclerosis (HCC) 10/23/2010   unconfirmed, but suspected   Myocardial infarction (HCC)    Obesity    Osteoporosis 09/24/2012   on meds   Palpitations    Seasonal allergies    Unspecified vitamin D  deficiency 10/23/2010    Past Surgical History:  Procedure Laterality Date   ABDOMINAL HYSTERECTOMY     partial-uterus removed   LEFT HEART CATHETERIZATION WITH CORONARY ANGIOGRAM N/A 03/31/2014   Procedure: LEFT HEART CATHETERIZATION WITH CORONARY ANGIOGRAM;  Surgeon: Vinie KYM Maxcy, MD;  [facility] CATH LAB;  Service: Cardiovascular;  Laterality: N/A;   Neg stress test  09/08/2005   s/p eye surgury     s/p knee surgury     WISDOM TOOTH EXTRACTION      Family History  Problem Relation Age of Onset   Cancer Mother        breast   Hyperlipidemia Mother    Colon polyps Mother    Heart disease Father    Diabetes Father    Hyperlipidemia Father    Hypertension Father    Colon polyps Father    Kidney disease  Father    Thyroid  disease Father    Lupus Maternal Aunt    Lupus Cousin        mother and father's side   Diabetes Other    Arthritis Other    Dementia Other    Cancer Other        2 uncles with lung cancer   Heart disease Other        2 uncles with heart disease   Colon cancer Neg Hx    Esophageal cancer Neg Hx    Stomach cancer Neg Hx    Rectal cancer Neg Hx     Social History   Socioeconomic History   Marital status: Married    Spouse name: Grae Leathers   Number of children: 1   Years of education: College   Highest education level: Bachelor's degree (e.g., BA, AB, BS)  Occupational History   Occupation: Disabled    Comment:  due to MS since 2005   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   Patient is right handed.   Patient does not drink caffeine.   Social Drivers of Corporate investment banker Strain: Low Risk  (09/16/2023)   Overall Financial Resource Strain (CARDIA)    Difficulty of Paying Living Expenses: Not hard at all  Food Insecurity: No Food Insecurity (09/16/2023)   Hunger Vital Sign    Worried About Running Out of Food in the Last Year: Never true    Ran Out of Food in the Last Year: Never true  Transportation Needs: No Transportation Needs (09/16/2023)   PRAPARE - Administrator, Civil Service (Medical): No    Lack of Transportation (Non-Medical): No  Physical Activity: Insufficiently Active (09/16/2023)   Exercise Vital Sign    Days of Exercise per Week: 2 days    Minutes of Exercise per Session: 20 min  Stress: No Stress Concern Present (09/16/2023)   [employer] of  Occupational Health - Occupational Stress Questionnaire    Feeling of Stress : Not at all  Social Connections: Socially Integrated (09/16/2023)   Social Connection and Isolation Panel    Frequency of Communication with Friends and Family: More than three times a week    Frequency of Social Gatherings with Friends and Family: More than three times a week    Attends Religious Services: More than 4 times per year    Active Member of [employer] or Organizations: Yes    Attends Banker Meetings: More than 4 times per year    Marital Status: Married  Catering manager Violence: Not At Risk (06/10/2022)   Humiliation, Afraid, Rape, and Kick questionnaire    Fear of Current or Ex-Partner: No    Emotionally Abused: No    Physically Abused: No    Sexually Abused: No     Physical Exam   Vitals:   05/25/24 0410 05/25/24 0556  BP: (!) 148/86 (!) 130/94  Pulse: 78 75  Resp: 17 17  Temp: 98.3 F (36.8 C) 97.9 F (36.6 C)  SpO2: 99% 99%    CONSTITUTIONAL: Well-appearing, NAD NEURO/PSYCH:  Alert and oriented x 3, no focal deficits EYES:  eyes equal and reactive ENT/NECK:  no LAD, no JVD CARDIO: Regular rate, well-perfused, normal S1 and S2 PULM:  CTAB no wheezing or rhonchi GI/GU:  non-distended, non-tender MSK/SPINE:  No gross deformities, no edema SKIN:  no rash, atraumatic   *Additional and/or pertinent findings included in MDM below  Diagnostic and Interventional Summary    EKG Interpretation Date/Time:  Wednesday May 25 2024 05:56:14 EDT Ventricular Rate:  77 PR Interval:  169 QRS Duration:  94 QT Interval:  387 QTC Calculation: 438 R Axis:   9  Text Interpretation: Sinus rhythm Low voltage, precordial leads Consider anterior infarct ST elevation, consider inferior injury Confirmed by Theadore Sharper [phone]) on 05/25/2024 6:45:55 AM       Labs Reviewed  CBC  BASIC METABOLIC PANEL WITH GFR  PRO BRAIN NATRIURETIC PEPTIDE  D-DIMER, QUANTITATIVE  TROPONIN T,  HIGH SENSITIVITY    DG Chest Port 1 View  Final Result      Medications  methocarbamol  (ROBAXIN ) tablet 1,000 mg (1,000 mg Oral Given 05/25/24 0605)  naproxen  (NAPROSYN ) tablet 500 mg (500 mg Oral Given 05/25/24 9394)     Procedures  /  Critical Care Procedures  ED Course and Medical Decision Making  Initial Impression and Ddx Back pain, also complaining of shortness of breath.  Only when laying flat.  Unclear if this is shortness of breath related to the discomfort, positional increase in pain related to MSK pain or related to orthopnea, pulmonary edema, PE also consideration but felt to be unlikely.  Atypical presentation of ACS also considered.  Past medical/surgical history that increases complexity of ED encounter: MI  Interpretation of Diagnostics I personally reviewed the EKG and my interpretation is as follows: Sinus rhythm without ischemic changes  Labs pending  Patient Reassessment and Ultimate Disposition/Management     Signed out to oncoming provider at shift change.  Patient management required discussion with the following services or consulting groups:  None  Complexity of Problems Addressed Acute illness or injury that poses threat of life of bodily function  Additional Data Reviewed and Analyzed Further history obtained from: Further history from spouse/family member  Additional Factors Impacting ED Encounter Risk None  Ozell HERO. Theadore, MD [facility] Emergency Medicine [facility] [email or web address]  Final Clinical Impressions(s) / ED Diagnoses     ICD-10-CM   1. Neck pain  M54.2       ED Discharge Orders     None        Discharge Instructions Discussed with and Provided to Patient:   Discharge Instructions   None      Theadore Ozell HERO, MD 05/25/24 [phone]

## 2024-05-25 NOTE — Discharge Instructions (Addendum)
 You were evaluated in the Emergency Department and after careful evaluation, we did not find any emergent condition requiring admission or further testing in the hospital.  Your exam/testing today is overall reassuring.  No signs of heart or lung issues.  Recommend using the Naprosyn  twice daily as prescribed for pain.  Can use the Robaxin  muscle relaxer for more significant pain, best used at night if you are having trouble sleeping as it can cause drowsiness.  You can also try Voltaren gel to the area and a heating pad.  Also massage may be helpful  Please return to the Emergency Department if you experience any worsening of your condition.   Thank you for allowing us  to be a part of your care.

## 2024-05-25 NOTE — ED Provider Notes (Signed)
 Assumed care from Dr.Bero.  Patient with pain in the right side of her neck but now pain in her chest and back.  When speaking with the patient she reports it started initially with a crick in her neck several days ago but in the last 12 hours the pain has moved down into the right side of her chest and back and is made it very uncomfortable for her to breathe.  Taking a deep breath makes the pain worse and she is feeling very winded.  It is remained on the right side she denies any symptoms down her arm or in her leg.  She has no facial symptoms.  When discussing if she has ever had issues like this related to MS she reports she initially was diagnosed with MS when her son was born but then she followed up with a specialist and they told her she did not have MS and she is not treated for that.  She denies ever having any symptoms suggestive of this in the past.  I dependently interpreted patient's labs BMP without acute findings, proBNP and troponin are negative.  D-dimer is mildly elevated and age-adjusted is negative however given patient's significant symptoms concern for possible dissection.  Low suspicion for limb ischemia as she does have pulse present in the extremity and she is not having any strokelike symptoms.  Vital signs are reassuring.  Did give patient further pain control and CTA to rule out dissection pending.  I have independently visualized and interpreted pt's images today.  CT did show some atelectasis but no evidence of dissection.  Radiology also reports there is some dilation of the pulmonary arteries that could be pulmonary hypertension and a myolipoma of the adrenal gland but appears benign.  No other acute findings are noted.  Discussed all this with the patient.  Suspect patient's symptoms are all musculoskeletal in nature and just very severe.  Discussed with her trying muscle relaxers, anti-inflammatories and steroids.  She denies any recent manipulation of her neck, car accidents or  other reasons for any traumatic neck pain.  Low suspicion for vertebral artery dissection.  At this time vital signs remained stable.  Feel that patient is stable for discharge.    Doretha Folks, MD 05/25/24 1312

## 2024-05-25 NOTE — ED Triage Notes (Signed)
 Pt states that she has been having neck pain on the R side of her neck that shoots down into her shoulder since Monday night

## 2024-05-26 ENCOUNTER — Ambulatory Visit (INDEPENDENT_AMBULATORY_CARE_PROVIDER_SITE_OTHER): Admitting: Family Medicine

## 2024-06-17 DIAGNOSIS — H31003 Unspecified chorioretinal scars, bilateral: Secondary | ICD-10-CM | POA: Diagnosis not present

## 2024-06-17 DIAGNOSIS — H401112 Primary open-angle glaucoma, right eye, moderate stage: Secondary | ICD-10-CM | POA: Diagnosis not present

## 2024-06-30 ENCOUNTER — Ambulatory Visit (INDEPENDENT_AMBULATORY_CARE_PROVIDER_SITE_OTHER): Payer: PRIVATE HEALTH INSURANCE | Admitting: Adult Health

## 2024-06-30 VITALS — BP 112/75 | HR 68 | Temp 97.6°F | Ht 64.0 in | Wt 220.0 lb

## 2024-06-30 DIAGNOSIS — E538 Deficiency of other specified B group vitamins: Secondary | ICD-10-CM

## 2024-06-30 DIAGNOSIS — E66812 Obesity, class 2: Secondary | ICD-10-CM

## 2024-06-30 DIAGNOSIS — R7303 Prediabetes: Secondary | ICD-10-CM

## 2024-06-30 DIAGNOSIS — E7800 Pure hypercholesterolemia, unspecified: Secondary | ICD-10-CM | POA: Diagnosis not present

## 2024-06-30 DIAGNOSIS — E669 Obesity, unspecified: Secondary | ICD-10-CM | POA: Diagnosis not present

## 2024-06-30 DIAGNOSIS — E559 Vitamin D deficiency, unspecified: Secondary | ICD-10-CM | POA: Diagnosis not present

## 2024-06-30 DIAGNOSIS — Z6837 Body mass index (BMI) 37.0-37.9, adult: Secondary | ICD-10-CM

## 2024-06-30 MED ORDER — VITAMIN D (ERGOCALCIFEROL) 1.25 MG (50000 UNIT) PO CAPS: 50000.0000 [IU] | ORAL_CAPSULE | ORAL | 0 refills | Status: DC

## 2024-06-30 NOTE — Progress Notes (Signed)
 WEIGHT SUMMARY AND BIOMETRICS  Vitals Temp: 97.6 F (36.4 C) BP: 112/75 Pulse Rate: 68 SpO2: 100 %   Anthropometric Measurements Height: 5' 4 (1.626 m) Weight: 220 lb (99.8 kg) BMI (Calculated): 37.74 Weight at Last Visit: 213 lb Weight Lost Since Last Visit: 0 Weight Gained Since Last Visit: 7 lb Starting Weight: 234 lb Total Weight Loss (lbs): 14 lb (6.35 kg)   Body Composition  Body Fat %: 49.2 % Fat Mass (lbs): 108.4 lbs Muscle Mass (lbs): 106.4 lbs Total Body Water (lbs): 75.8 lbs Visceral Fat Rating : 15   Other Clinical Data Fasting: no Labs: no Today's Visit #: 6 Starting Date: 07/13/23    Chief Complaint:   OBESITY [name] is here to discuss her progress with her obesity treatment plan.  She is on the the Category 1 Plan and states she is following her eating plan approximately 0 % of the time.  She states she is exercising: None   Interim History:  Last OV with [facility] was 01/07/2024 with Dr. Midge  She has been off weekly Ergocalciferol  for months.  She has not been following prescribed meal plan and has stopped regular exercise at local [facility] since ED visit for acute neck pain on 05/25/24  Hydration-she recently had dental work and has not been drinking water/fluids due to oral pain.  She reports fatigue and dizziness during vital sign   She and her husband will close on a new home next week.  She, her husband, and her adult son (age 40) will move from her mother's house into their new home in 1.5 weeks.  Subjective:   1. Pure hypercholesterolemia Lipid Panel     Component Value Date/Time   CHOL 202 (H) 09/18/2023 0929   CHOL 186 12/01/2022 0948   TRIG 61.0 09/18/2023 0929   HDL 53.70 09/18/2023 0929   HDL 58 12/01/2022 0948   CHOLHDL 4 09/18/2023 0929   VLDL 12.2 09/18/2023 0929   LDLCALC 136 (H) 09/18/2023 0929   LDLCALC 110 (H) 12/01/2022 0948   LDLDIRECT 184.9 08/04/2012 1652   LABVLDL 18 12/01/2022 0948    PCP manages daily    ezetimibe -simvastatin  (VYTORIN ) 10-40 MG tablet    2. Pre-diabetes  Latest Reference Range & Units 12/09/23 14:24  Hemoglobin A1C 4.8 - 5.6 % 5.9 (H)  Est. average glucose Bld gHb Est-mCnc mg/dL 876  INSULIN  2.6 - 24.9 uIU/mL 8.6  (H): Data is abnormally high  She has never been on antidiabetic medications. She reports that her father was diabetic.  3. Vitamin B12 deficiency  Latest Reference Range & Units 12/09/23 14:24  Vitamin B12 232 - 1,245 pg/mL 1,775 (H)  (H): Data is abnormally high  B12 level high normal  4. Vitamin D  deficiency  Latest Reference Range & Units 12/09/23 14:24  Vitamin D , 25-Hydroxy 30.0 - 100.0 ng/mL 34.7   She has been off weekly Ergocalciferol  for months. She is agreeable to restart replacment.  Assessment/Plan:   1. Pure hypercholesterolemia Restart Cat 1 MP  2. Pre-diabetes (Primary) Restart Cat 1 MP  3. Vitamin B12 deficiency Monitor Labs  4. Vitamin D  deficiency Restart  Vitamin D , Ergocalciferol , (DRISDOL ) 1.25 MG (50000 UNIT) CAPS capsule Take 1 capsule (50,000 Units total) by mouth every 7 (seven) days. Dispense: 12 capsule, Refills: 0 ordered   5. Obesity,current BMI 37.8  [name] is not currently in the action stage of change. As such, her goal is to get back to weightloss efforts . She has agreed  to the Category 1 Plan.   Drink at least half of your body weight in water each day.  Exercise goals: All adults should avoid inactivity. Some physical activity is better than none, and adults who participate in any amount of physical activity gain some health benefits. Adults should also include muscle-strengthening activities that involve all major muscle groups on 2 or more days a week.  Behavioral modification strategies: increasing lean protein intake, decreasing simple carbohydrates, increasing vegetables, increasing water intake, no skipping meals, meal planning and cooking strategies, keeping healthy foods in the home, ways to  avoid boredom eating, and planning for success.  [name] has agreed to follow-up with our clinic in 4 weeks. She was informed of the importance of frequent follow-up visits to maximize her success with intensive lifestyle modifications for her multiple health conditions.   Objective:   Blood pressure 112/75, pulse 68, temperature 97.6 F (36.4 C), height 5' 4 (1.626 m), weight 220 lb (99.8 kg), SpO2 100%. Body mass index is 37.76 kg/m.  General: Cooperative, alert, well developed, in no acute distress. HEENT: Conjunctivae and lids unremarkable. Cardiovascular: Regular rhythm.  Lungs: Normal work of breathing. Neurologic: No focal deficits.   Lab Results  Component Value Date   CREATININE 0.53 05/25/2024   BUN 10 05/25/2024   NA 139 05/25/2024   K 4.4 05/25/2024   CL 105 05/25/2024   CO2 24 05/25/2024   Lab Results  Component Value Date   ALT 19 09/18/2023   AST 23 09/18/2023   ALKPHOS 87 09/18/2023   BILITOT 0.4 09/18/2023   Lab Results  Component Value Date   HGBA1C 5.9 (H) 12/09/2023   HGBA1C 6.0 09/18/2023   HGBA1C 5.8 04/06/2023   HGBA1C 5.9 (H) 12/01/2022   HGBA1C 5.9 (H) 04/22/2022   Lab Results  Component Value Date   INSULIN  8.6 12/09/2023   INSULIN  10.4 12/01/2022   INSULIN  8.7 04/22/2022   Lab Results  Component Value Date   TSH 1.02 09/18/2023   Lab Results  Component Value Date   CHOL 202 (H) 09/18/2023   HDL 53.70 09/18/2023   LDLCALC 136 (H) 09/18/2023   LDLDIRECT 184.9 08/04/2012   TRIG 61.0 09/18/2023   CHOLHDL 4 09/18/2023   Lab Results  Component Value Date   VD25OH 34.7 12/09/2023   VD25OH 43.84 04/06/2023   VD25OH 39.3 12/01/2022   Lab Results  Component Value Date   WBC 8.2 05/25/2024   HGB 13.1 05/25/2024   HCT 41.5 05/25/2024   MCV 95.4 05/25/2024   PLT 266 05/25/2024   Lab Results  Component Value Date   IRON 69 07/01/2019   Attestation Statements:   Reviewed by clinician on day of visit: allergies, medications,  problem list, medical history, surgical history, family history, social history, and previous encounter notes.  I have reviewed the above documentation for accuracy and completeness, and I agree with the above. -  Ronit Cranfield d. Aracelie Addis, NP-C

## 2024-07-25 ENCOUNTER — Encounter (INDEPENDENT_AMBULATORY_CARE_PROVIDER_SITE_OTHER): Payer: Self-pay | Admitting: Adult Health

## 2024-07-25 ENCOUNTER — Ambulatory Visit (INDEPENDENT_AMBULATORY_CARE_PROVIDER_SITE_OTHER): Payer: PRIVATE HEALTH INSURANCE | Admitting: Adult Health

## 2024-07-25 VITALS — BP 103/70 | HR 89 | Temp 97.8°F | Ht 64.0 in | Wt 223.0 lb

## 2024-07-25 DIAGNOSIS — R7303 Prediabetes: Secondary | ICD-10-CM | POA: Diagnosis not present

## 2024-07-25 DIAGNOSIS — E7800 Pure hypercholesterolemia, unspecified: Secondary | ICD-10-CM | POA: Diagnosis not present

## 2024-07-25 DIAGNOSIS — E559 Vitamin D deficiency, unspecified: Secondary | ICD-10-CM

## 2024-07-25 DIAGNOSIS — Z6838 Body mass index (BMI) 38.0-38.9, adult: Secondary | ICD-10-CM

## 2024-07-25 DIAGNOSIS — E66812 Obesity, class 2: Secondary | ICD-10-CM

## 2024-07-25 DIAGNOSIS — E538 Deficiency of other specified B group vitamins: Secondary | ICD-10-CM

## 2024-07-25 DIAGNOSIS — F439 Reaction to severe stress, unspecified: Secondary | ICD-10-CM

## 2024-07-25 DIAGNOSIS — E669 Obesity, unspecified: Secondary | ICD-10-CM

## 2024-07-25 MED ORDER — VITAMIN D (ERGOCALCIFEROL) 1.25 MG (50000 UNIT) PO CAPS: 50000.0000 [IU] | ORAL_CAPSULE | ORAL | 0 refills | Status: DC

## 2024-07-25 NOTE — Progress Notes (Signed)
 WEIGHT SUMMARY AND BIOMETRICS  Vitals Temp: 97.8 F (36.6 C) BP: 103/70 Pulse Rate: 89 SpO2: 98 %   Anthropometric Measurements Height: 5' 4 (1.626 m) Weight: 223 lb (101.2 kg) BMI (Calculated): 38.26 Weight at Last Visit: 220lb Weight Lost Since Last Visit: 0lb Weight Gained Since Last Visit: 3lb Starting Weight: 234lb Total Weight Loss (lbs): 11 lb (4.99 kg)   Body Composition  Body Fat %: 49.7 % Fat Mass (lbs): 110.8 lbs Muscle Mass (lbs): 106.6 lbs Total Body Water (lbs): 78 lbs Visceral Fat Rating : 15   Other Clinical Data Fasting: No Labs: No Today's Visit #: 7 Starting Date: 07/13/23    Chief Complaint:   OBESITY [name] is here to discuss her progress with her obesity treatment plan.  She is on the the Category 1 Plan and states she is following her eating plan approximately 0 % of the time.  She states she is exercising Walking 30 minutes 3 times per week.  Interim History:  Last OV with [facility] was 01/07/2024 with Dr. Midge  She restarted at [facility] on 06/30/24-  Resumed Cat 1 MP and restarted weekly Ergocalciferol   She, her husband, and her adult son (age 46) have moved into their new home. Her son suffered a R Knee injury during the move. He is ambulating with crutches and has MRI R Knee scheduled for this Friday 07/29/24 He has f/u with [facility] 08/09/24  Due to move and her new kitchen was without appliances- she has been following Cat 1 MP She has been eating out- trying to choose healthier options, ie: salads and grilled fish  Subjective:   1. Vitamin D  deficiency  Latest Reference Range & Units 12/01/22 09:48 04/06/23 14:13 12/09/23 14:24  Vitamin D , 25-Hydroxy 30.0 - 100.0 ng/mL 39.3  34.7  VITD 30.00 - 100.00 ng/mL  43.84    She restarted weekly Ergocalciferol  3 weeks ago  2. Vitamin B12 deficiency  Latest Reference Range & Units 12/01/22 09:48 04/06/23 14:13 12/09/23 14:24  Vitamin B12 232 - 1,245 pg/mL 1,183 751 1,775  (H)  (H): Data is abnormally high  B12 slightly over replaced  3. Hypercholesterolemia Lipid Panel     Component Value Date/Time   CHOL 202 (H) 09/18/2023 0929   CHOL 186 12/01/2022 0948   TRIG 61.0 09/18/2023 0929   HDL 53.70 09/18/2023 0929   HDL 58 12/01/2022 0948   CHOLHDL 4 09/18/2023 0929   VLDL 12.2 09/18/2023 0929   LDLCALC 136 (H) 09/18/2023 0929   LDLCALC 110 (H) 12/01/2022 0948   LDLDIRECT 184.9 08/04/2012 1652   LABVLDL 18 12/01/2022 0948    8/89/7974 CT Cardiac Score ordered- not completed  4. Pre-diabetes Lab Results  Component Value Date   HGBA1C 5.9 (H) 12/09/2023   HGBA1C 6.0 09/18/2023   HGBA1C 5.8 04/06/2023    Pertinent family hx:her father was diabetic. She has never been on antidiabetic medications. She reports stable appetite  5. Stress at home Recent home move and her adult son suffered R knee injury. Son is unable to assist with move or drive. Son is able to continue college courses via online format  Assessment/Plan:   1. Vitamin D  deficiency (Primary) Refill  Vitamin D , Ergocalciferol , (DRISDOL ) 1.25 MG (50000 UNIT) CAPS capsule Take 1 capsule (50,000 Units total) by mouth every 7 (seven) days. Dispense: 12 capsule, Refills: 0 ordered   2. Vitamin B12 deficiency Monitor Labs  3. Hypercholesterolemia Reduce sat fat and increase daily cardiovascular exercise  4.  Pre-diabetes Reduce simple CHO/sugar intake and increase daily cardiovascular exercise  5. Stress at home Unpack two boxes per day Increase plain water intake Support son during his recovery from R knee injury  6. Obesity,current BMI 38.3  Annell is currently in the action stage of change. As such, her goal is to get back to weightloss efforts . She has agreed to the Category 1 Plan.   Exercise goals: All adults should avoid inactivity. Some physical activity is better than none, and adults who participate in any amount of physical activity gain some health benefits.  Adults should also include muscle-strengthening activities that involve all major muscle groups on 2 or more days a week. Unpack 2 boxes per day  Behavioral modification strategies: increasing lean protein intake, decreasing simple carbohydrates, increasing vegetables, increasing water intake, decreasing liquid calories, decreasing eating out, meal planning and cooking strategies, keeping healthy foods in the home, ways to avoid boredom eating, and planning for success.  Deloris has agreed to follow-up with our clinic in 4 weeks. She was informed of the importance of frequent follow-up visits to maximize her success with intensive lifestyle modifications for her multiple health conditions.   Objective:   Blood pressure 103/70, pulse 89, temperature 97.8 F (36.6 C), height 5' 4 (1.626 m), weight 223 lb (101.2 kg), SpO2 98%. Body mass index is 38.28 kg/m.  General: Cooperative, alert, well developed, in no acute distress. HEENT: Conjunctivae and lids unremarkable. Cardiovascular: Regular rhythm.  Lungs: Normal work of breathing. Neurologic: No focal deficits.   Lab Results  Component Value Date   CREATININE 0.53 05/25/2024   BUN 10 05/25/2024   NA 139 05/25/2024   K 4.4 05/25/2024   CL 105 05/25/2024   CO2 24 05/25/2024   Lab Results  Component Value Date   ALT 19 09/18/2023   AST 23 09/18/2023   ALKPHOS 87 09/18/2023   BILITOT 0.4 09/18/2023   Lab Results  Component Value Date   HGBA1C 5.9 (H) 12/09/2023   HGBA1C 6.0 09/18/2023   HGBA1C 5.8 04/06/2023   HGBA1C 5.9 (H) 12/01/2022   HGBA1C 5.9 (H) 04/22/2022   Lab Results  Component Value Date   INSULIN  8.6 12/09/2023   INSULIN  10.4 12/01/2022   INSULIN  8.7 04/22/2022   Lab Results  Component Value Date   TSH 1.02 09/18/2023   Lab Results  Component Value Date   CHOL 202 (H) 09/18/2023   HDL 53.70 09/18/2023   LDLCALC 136 (H) 09/18/2023   LDLDIRECT 184.9 08/04/2012   TRIG 61.0 09/18/2023   CHOLHDL 4  09/18/2023   Lab Results  Component Value Date   VD25OH 34.7 12/09/2023   VD25OH 43.84 04/06/2023   VD25OH 39.3 12/01/2022   Lab Results  Component Value Date   WBC 8.2 05/25/2024   HGB 13.1 05/25/2024   HCT 41.5 05/25/2024   MCV 95.4 05/25/2024   PLT 266 05/25/2024   Lab Results  Component Value Date   IRON 69 07/01/2019   Attestation Statements:   Reviewed by clinician on day of visit: allergies, medications, problem list, medical history, surgical history, family history, social history, and previous encounter notes.  I have reviewed the above documentation for accuracy and completeness, and I agree with the above. -  Alice Vitelli d. Jarquez Mestre, NP-C

## 2024-08-25 ENCOUNTER — Ambulatory Visit (INDEPENDENT_AMBULATORY_CARE_PROVIDER_SITE_OTHER): Admitting: Adult Health

## 2024-09-11 ENCOUNTER — Other Ambulatory Visit: Payer: Self-pay | Admitting: Internal Medicine

## 2024-09-20 ENCOUNTER — Encounter (INDEPENDENT_AMBULATORY_CARE_PROVIDER_SITE_OTHER): Payer: Self-pay

## 2024-09-20 ENCOUNTER — Ambulatory Visit (INDEPENDENT_AMBULATORY_CARE_PROVIDER_SITE_OTHER): Payer: Self-pay

## 2024-09-21 ENCOUNTER — Encounter (INDEPENDENT_AMBULATORY_CARE_PROVIDER_SITE_OTHER): Payer: Self-pay | Admitting: Adult Health

## 2024-09-21 ENCOUNTER — Ambulatory Visit (INDEPENDENT_AMBULATORY_CARE_PROVIDER_SITE_OTHER): Admitting: Adult Health

## 2024-09-21 VITALS — BP 107/71 | HR 72 | Temp 97.8°F | Ht 64.0 in | Wt 226.0 lb

## 2024-09-21 DIAGNOSIS — Z6838 Body mass index (BMI) 38.0-38.9, adult: Secondary | ICD-10-CM | POA: Diagnosis not present

## 2024-09-21 DIAGNOSIS — E7800 Pure hypercholesterolemia, unspecified: Secondary | ICD-10-CM

## 2024-09-21 DIAGNOSIS — E66812 Obesity, class 2: Secondary | ICD-10-CM

## 2024-09-21 DIAGNOSIS — E669 Obesity, unspecified: Secondary | ICD-10-CM | POA: Diagnosis not present

## 2024-09-21 DIAGNOSIS — R7303 Prediabetes: Secondary | ICD-10-CM | POA: Diagnosis not present

## 2024-09-21 DIAGNOSIS — E559 Vitamin D deficiency, unspecified: Secondary | ICD-10-CM | POA: Diagnosis not present

## 2024-09-21 MED ORDER — VITAMIN D (ERGOCALCIFEROL) 1.25 MG (50000 UNIT) PO CAPS: 50000.0000 [IU] | ORAL_CAPSULE | ORAL | 0 refills | Status: AC

## 2024-09-21 NOTE — Progress Notes (Signed)
 "    WEIGHT SUMMARY AND BIOMETRICS  Vitals Temp: 97.8 F (36.6 C) BP: 107/71 Pulse Rate: 72 SpO2: 100 %   Anthropometric Measurements Height: 5' 4 (1.626 m) Weight: 226 lb (102.5 kg) BMI (Calculated): 38.77 Weight at Last Visit: 223lb Weight Lost Since Last Visit: 0lb Weight Gained Since Last Visit: 3lb Starting Weight: 234lb Total Weight Loss (lbs): 8 lb (3.629 kg)   Body Composition  Body Fat %: 49.5 % Fat Mass (lbs): 112 lbs Muscle Mass (lbs): 108.4 lbs Total Body Water (lbs): 77.4 lbs   Other Clinical Data Fasting: No Labs: No Today's Visit #: 8 Starting Date: 07/13/23    Chief Complaint:   OBESITY [name] is here to discuss her progress with her obesity treatment plan.  She is on the the Category 1 Plan and states she is following her eating plan approximately 15 % of the time.  She states she is exercising: None  Interim History:  Last OV at [facility] was 07/25/2024 She reports lust falling off the eating plan and exercise at [facility] since last Nov 2025. Her son is still recovering from a R knee injury- he is in PT and followed by [facility] She has moved into new home- continues to unpack. Since last Nov 25- she has not been consuming meals, rather snacking all day, ie: veggie chips, crackers  Discussed various eating plan to help with a re-set, she is agreeable to start Low CHO MP  Subjective:   1. Pure hypercholesterolemia Lipid Panel     Component Value Date/Time   CHOL 202 (H) 09/18/2023 0929   CHOL 186 12/01/2022 0948   TRIG 61.0 09/18/2023 0929   HDL 53.70 09/18/2023 0929   HDL 58 12/01/2022 0948   CHOLHDL 4 09/18/2023 0929   VLDL 12.2 09/18/2023 0929   LDLCALC 136 (H) 09/18/2023 0929   LDLCALC 110 (H) 12/01/2022 0948   LDLDIRECT 184.9 08/04/2012 1652   LABVLDL 18 12/01/2022 0948    She denies tobacco/vape use  2. Vitamin D  deficiency  Latest Reference Range & Units 12/09/23 14:24  Vitamin D , 25-Hydroxy 30.0 - 100.0 ng/mL  34.7   She has been off Ergocalciferol  for weeks due to lack of f/u at [facility]  3. Pre-diabetes Lab Results  Component Value Date   HGBA1C 5.9 (H) 12/09/2023   HGBA1C 6.0 09/18/2023   HGBA1C 5.8 04/06/2023    A1c slightly improved, however still in pre-diabetic range  Assessment/Plan:   1. Pure hypercholesterolemia Reduce saturated fat Remain active daily  2. Vitamin D  deficiency (Primary) Refill - Vitamin D , Ergocalciferol , (DRISDOL ) 1.25 MG (50000 UNIT) CAPS capsule; Take 1 capsule (50,000 Units total) by mouth every 7 (seven) days.  Dispense: 12 capsule; Refill: 0  3. Pre-diabetes Increase lean protein Limit sugar/simple CHO  3. Obesity,current BMI 38.8  Evie is not currently in the action stage of change. As such, her goal is to get back to weightloss efforts . She has agreed to following a lower carbohydrate, vegetable and lean protein rich diet plan.   Exercise goals: UNPACK HOME  Behavioral modification strategies: increasing lean protein intake, decreasing simple carbohydrates, increasing vegetables, increasing water intake, no skipping meals, meal planning and cooking strategies, keeping healthy foods in the home, and planning for success.  Aileana has agreed to follow-up with our clinic in 4 weeks. She was informed of the importance of frequent follow-up visits to maximize her success with intensive lifestyle modifications for her multiple health conditions.   Check Fasting Labs at next  OV  Objective:   Blood pressure 107/71, pulse 72, temperature 97.8 F (36.6 C), height 5' 4 (1.626 m), weight 226 lb (102.5 kg), SpO2 100%. Body mass index is 38.79 kg/m.  General: Cooperative, alert, well developed, in no acute distress. HEENT: Conjunctivae and lids unremarkable. Cardiovascular: Regular rhythm.  Lungs: Normal work of breathing. Neurologic: No focal deficits.   Lab Results  Component Value Date   CREATININE 0.53 05/25/2024   BUN 10 05/25/2024   NA 139  05/25/2024   K 4.4 05/25/2024   CL 105 05/25/2024   CO2 24 05/25/2024   Lab Results  Component Value Date   ALT 19 09/18/2023   AST 23 09/18/2023   ALKPHOS 87 09/18/2023   BILITOT 0.4 09/18/2023   Lab Results  Component Value Date   HGBA1C 5.9 (H) 12/09/2023   HGBA1C 6.0 09/18/2023   HGBA1C 5.8 04/06/2023   HGBA1C 5.9 (H) 12/01/2022   HGBA1C 5.9 (H) 04/22/2022   Lab Results  Component Value Date   INSULIN  8.6 12/09/2023   INSULIN  10.4 12/01/2022   INSULIN  8.7 04/22/2022   Lab Results  Component Value Date   TSH 1.02 09/18/2023   Lab Results  Component Value Date   CHOL 202 (H) 09/18/2023   HDL 53.70 09/18/2023   LDLCALC 136 (H) 09/18/2023   LDLDIRECT 184.9 08/04/2012   TRIG 61.0 09/18/2023   CHOLHDL 4 09/18/2023   Lab Results  Component Value Date   VD25OH 34.7 12/09/2023   VD25OH 43.84 04/06/2023   VD25OH 39.3 12/01/2022   Lab Results  Component Value Date   WBC 8.2 05/25/2024   HGB 13.1 05/25/2024   HCT 41.5 05/25/2024   MCV 95.4 05/25/2024   PLT 266 05/25/2024   Lab Results  Component Value Date   IRON 69 07/01/2019   Attestation Statements:   Reviewed by clinician on day of visit: allergies, medications, problem list, medical history, surgical history, family history, social history, and previous encounter notes.  I have reviewed the above documentation for accuracy and completeness, and I agree with the above. -  Jiovanna Frei d. Ares Tegtmeyer, NP-C "

## 2024-10-07 ENCOUNTER — Encounter: Payer: Self-pay | Admitting: Internal Medicine

## 2024-10-07 ENCOUNTER — Ambulatory Visit: Payer: Self-pay | Admitting: Internal Medicine

## 2024-10-07 ENCOUNTER — Ambulatory Visit: Admitting: Internal Medicine

## 2024-10-07 VITALS — BP 120/78 | HR 64 | Temp 98.5°F | Ht 64.0 in | Wt 228.0 lb

## 2024-10-07 DIAGNOSIS — R739 Hyperglycemia, unspecified: Secondary | ICD-10-CM

## 2024-10-07 DIAGNOSIS — E538 Deficiency of other specified B group vitamins: Secondary | ICD-10-CM | POA: Diagnosis not present

## 2024-10-07 DIAGNOSIS — R7303 Prediabetes: Secondary | ICD-10-CM | POA: Diagnosis not present

## 2024-10-07 DIAGNOSIS — E669 Obesity, unspecified: Secondary | ICD-10-CM | POA: Diagnosis not present

## 2024-10-07 DIAGNOSIS — E78019 Familial hypercholesterolemia, unspecified: Secondary | ICD-10-CM

## 2024-10-07 DIAGNOSIS — E7800 Pure hypercholesterolemia, unspecified: Secondary | ICD-10-CM

## 2024-10-07 DIAGNOSIS — Z0000 Encounter for general adult medical examination without abnormal findings: Secondary | ICD-10-CM

## 2024-10-07 DIAGNOSIS — Z0001 Encounter for general adult medical examination with abnormal findings: Secondary | ICD-10-CM

## 2024-10-07 DIAGNOSIS — E559 Vitamin D deficiency, unspecified: Secondary | ICD-10-CM | POA: Diagnosis not present

## 2024-10-07 DIAGNOSIS — G35D Multiple sclerosis, unspecified: Secondary | ICD-10-CM

## 2024-10-07 LAB — CBC WITH DIFFERENTIAL/PLATELET
Basophils Absolute: 0 10*3/uL (ref 0.0–0.1)
Basophils Relative: 0.6 % (ref 0.0–3.0)
Eosinophils Absolute: 0.1 10*3/uL (ref 0.0–0.7)
Eosinophils Relative: 1.2 % (ref 0.0–5.0)
HCT: 41.8 % (ref 36.0–46.0)
Hemoglobin: 13.9 g/dL (ref 12.0–15.0)
Lymphocytes Relative: 36.4 % (ref 12.0–46.0)
Lymphs Abs: 2.3 10*3/uL (ref 0.7–4.0)
MCHC: 33.3 g/dL (ref 30.0–36.0)
MCV: 92.8 fl (ref 78.0–100.0)
Monocytes Absolute: 0.4 10*3/uL (ref 0.1–1.0)
Monocytes Relative: 6.1 % (ref 3.0–12.0)
Neutro Abs: 3.5 10*3/uL (ref 1.4–7.7)
Neutrophils Relative %: 55.7 % (ref 43.0–77.0)
Platelets: 257 10*3/uL (ref 150.0–400.0)
RBC: 4.51 Mil/uL (ref 3.87–5.11)
RDW: 14.1 % (ref 11.5–15.5)
WBC: 6.3 10*3/uL (ref 4.0–10.5)

## 2024-10-07 LAB — BASIC METABOLIC PANEL WITH GFR
BUN: 18 mg/dL (ref 6–23)
CO2: 29 meq/L (ref 19–32)
Calcium: 9.8 mg/dL (ref 8.4–10.5)
Chloride: 103 meq/L (ref 96–112)
Creatinine, Ser: 0.58 mg/dL (ref 0.40–1.20)
GFR: 99.19 mL/min
Glucose, Bld: 85 mg/dL (ref 70–99)
Potassium: 4.4 meq/L (ref 3.5–5.1)
Sodium: 140 meq/L (ref 135–145)

## 2024-10-07 LAB — VITAMIN B12: Vitamin B-12: 1131 pg/mL — ABNORMAL HIGH (ref 211–911)

## 2024-10-07 LAB — URINALYSIS, ROUTINE W REFLEX MICROSCOPIC
Bilirubin Urine: NEGATIVE
Hgb urine dipstick: NEGATIVE
Leukocytes,Ua: NEGATIVE
Nitrite: NEGATIVE
RBC / HPF: NONE SEEN
Specific Gravity, Urine: 1.015 (ref 1.000–1.030)
Total Protein, Urine: NEGATIVE
Urine Glucose: NEGATIVE
Urobilinogen, UA: 0.2 (ref 0.0–1.0)
pH: 7 (ref 5.0–8.0)

## 2024-10-07 LAB — HEPATIC FUNCTION PANEL
ALT: 18 U/L (ref 3–35)
AST: 24 U/L (ref 5–37)
Albumin: 4.3 g/dL (ref 3.5–5.2)
Alkaline Phosphatase: 77 U/L (ref 39–117)
Bilirubin, Direct: 0.1 mg/dL (ref 0.1–0.3)
Total Bilirubin: 0.3 mg/dL (ref 0.2–1.2)
Total Protein: 7.4 g/dL (ref 6.0–8.3)

## 2024-10-07 LAB — HEMOGLOBIN A1C: Hgb A1c MFr Bld: 5.8 % (ref 4.6–6.5)

## 2024-10-07 LAB — LIPID PANEL
Cholesterol: 182 mg/dL (ref 28–200)
HDL: 58.8 mg/dL
LDL Cholesterol: 104 mg/dL — ABNORMAL HIGH (ref 10–99)
NonHDL: 123.09
Total CHOL/HDL Ratio: 3
Triglycerides: 96 mg/dL (ref 10.0–149.0)
VLDL: 19.2 mg/dL (ref 0.0–40.0)

## 2024-10-07 LAB — VITAMIN D 25 HYDROXY (VIT D DEFICIENCY, FRACTURES): VITD: 46.68 ng/mL (ref 30.00–100.00)

## 2024-10-07 LAB — TSH: TSH: 0.42 u[IU]/mL (ref 0.35–5.50)

## 2024-10-07 MED ORDER — WEGOVY 1.5 MG PO TABS: 1.5000 mg | ORAL_TABLET | Freq: Every day | ORAL | 11 refills | Status: AC

## 2024-10-07 NOTE — Progress Notes (Signed)
 The test results show that your current treatment is OK, as the tests are stable.  Please continue the same plan.  There is no other need for change of treatment or further evaluation based on these results, at this time.  thanks

## 2024-10-07 NOTE — Patient Instructions (Addendum)
 Please take all new medication as prescribed  - the Wegovy  pill at 1.5 mg per day (and text SAVE to [phone] for the coupon on your phone)  Please continue all other medications as before, and refills have been done if requested.  Please have the pharmacy call with any other refills you may need.  Please continue your efforts at being more active, low cholesterol diet, and weight control.  You are otherwise up to date with prevention measures today.  Please keep your appointments with your specialists as you may have planned  You will be contacted regarding the referral for: Neurology  Please go to the LAB at the blood drawing area for the tests to be done  You will be contacted by phone if any changes need to be made immediately.  Otherwise, you will receive a letter about your results with an explanation, but please check with MyChart first.  Please make an Appointment to return for your 1 year visit, or sooner if needed

## 2024-10-07 NOTE — Progress Notes (Unsigned)
 Patient ID: [name], female   DOB: 1965/12/23, 59 y.o.   MRN: [id number]         Chief Complaint:: wellness exam and MS, obesity, hld. Low b12 and Vit D       HPI:  [name] is a 59 y.o. female here for wellness exam; declines hep B for now, sees GYN yearly but is s/p TAH, o/w up to date                        Also due for f/u neurology as last seen was several yrs ago ay [facility]y who has retired, need to remain local.  Pt denies chest pain, increased sob or doe, wheezing, orthopnea, PND, increased LE swelling, palpitations, dizziness or syncope.   Pt denies polydipsia, polyuria, or new focal neuro s/s.    Pt denies fever, wt loss, night sweats, loss of appetite, or other constitutional symptoms  Unable to lose wt with diet and exercise,and interested in Wegovy  oral start.     Wt Readings from Last 3 Encounters:  10/07/24 228 lb (103.4 kg)  09/21/24 226 lb (102.5 kg)  07/25/24 223 lb (101.2 kg)   BP Readings from Last 3 Encounters:  10/07/24 120/78  09/21/24 107/71  07/25/24 103/70   Immunization History  Administered Date(s) Administered   Influenza, Seasonal, Injecte, Preservative Fre 06/15/2023   Influenza,inj,Quad PF,6+ Mos 07/01/2019, 07/26/2021, 07/25/2022, 06/27/2024   PFIZER Comirnaty(Gray Top)Covid-19 Tri-Sucrose Vaccine 02/18/2021   PFIZER(Purple Top)SARS-COV-2 Vaccination 11/19/2019, 12/13/2019, 06/18/2020, 07/09/2020   PNEUMOCOCCAL CONJUGATE-20 09/18/2023   Pfizer Covid-19 Vaccine Bivalent Booster 67yrs & up 07/08/2021   Pfizer(Comirnaty)Fall Seasonal Vaccine 12 years and older 06/26/2024   Td 03/27/2010   Tdap 01/25/2021   Unspecified SARS-COV-2 Vaccination 06/02/2022   Zoster Recombinant(Shingrix) 09/16/2021, 11/18/2021   Health Maintenance Due  Topic Date Due   Hepatitis B Vaccines 19-59 Average Risk (1 of 3 - 19+ 3-dose series) Never done   Medicare Annual Wellness (AWV)  06/11/2023      Past Medical History:  Diagnosis Date   ANXIETY  03/27/2010   BACK PAIN, CHRONIC 03/27/2010   Classic migraine with aura 05/30/2015   COMMON MIGRAINE 03/27/2010   DISC DISEASE, CERVICAL 03/27/2010   FATIGUE 03/27/2010   GERD 03/27/2010   Glaucoma    bilateral- on meds (eye drops)   HYPERLIPIDEMIA 03/27/2010   on meds   Multiple sclerosis 10/23/2010   unconfirmed, but suspected   Myocardial infarction (HCC)    Obesity    Osteoporosis 09/24/2012   on meds   Palpitations    Seasonal allergies    Unspecified vitamin D  deficiency 10/23/2010   Past Surgical History:  Procedure Laterality Date   ABDOMINAL HYSTERECTOMY     partial-uterus removed   LEFT HEART CATHETERIZATION WITH CORONARY ANGIOGRAM N/A 03/31/2014   Procedure: LEFT HEART CATHETERIZATION WITH CORONARY ANGIOGRAM;  Surgeon: Vinie KYM Maxcy, MD;  Location: [facility] CATH LAB;  Service: Cardiovascular;  Laterality: N/A;   Neg stress test  09/08/2005   s/p eye surgury     s/p knee surgury     WISDOM TOOTH EXTRACTION      reports that she has never smoked. She has never used smokeless tobacco. She reports that she does not drink alcohol and does not use drugs. family history includes Arthritis in an other family member; Cancer in her mother and another family member; Colon polyps in her father and mother; Dementia in an other family member; Diabetes in  her father and another family member; Heart disease in her father and another family member; Hyperlipidemia in her father and mother; Hypertension in her father; Kidney disease in her father; Lupus in her cousin and maternal aunt; Thyroid  disease in her father. Allergies[1] Medications Ordered Prior to Encounter[2]      ROS:  All others reviewed and negative.  Objective        PE:  BP 120/78 (BP Location: Left Arm, Patient Position: Sitting, Cuff Size: Normal)   Pulse 64   Temp 98.5 F (36.9 C) (Oral)   Ht 5' 4 (1.626 m)   Wt 228 lb (103.4 kg)   SpO2 98%   BMI 39.14 kg/m                 Constitutional: Pt appears in NAD                HENT: Head: NCAT.                Right Ear: External ear normal.                 Left Ear: External ear normal.                Eyes: . Pupils are equal, round, and reactive to light. Conjunctivae and EOM are normal               Nose: without d/c or deformity               Neck: Neck supple. Gross normal ROM               Cardiovascular: Normal rate and regular rhythm.                 Pulmonary/Chest: Effort normal and breath sounds without rales or wheezing.                Abd:  Soft, NT, ND, + BS, no organomegaly               Neurological: Pt is alert. At baseline orientation, motor grossly intact               Skin: Skin is warm. No rashes, no other new lesions, LE edema - none               Psychiatric: Pt behavior is normal without agitation   Micro: none  Cardiac tracings I have personally interpreted today:  none  Pertinent Radiological findings (summarize): none   Lab Results  Component Value Date   WBC 6.3 10/07/2024   HGB 13.9 10/07/2024   HCT 41.8 10/07/2024   PLT 257.0 10/07/2024   GLUCOSE 85 10/07/2024   CHOL 182 10/07/2024   TRIG 96.0 10/07/2024   HDL 58.80 10/07/2024   LDLDIRECT 184.9 08/04/2012   LDLCALC 104 (H) 10/07/2024   ALT 18 10/07/2024   AST 24 10/07/2024   NA 140 10/07/2024   K 4.4 10/07/2024   CL 103 10/07/2024   CREATININE 0.58 10/07/2024   BUN 18 10/07/2024   CO2 29 10/07/2024   TSH 0.42 10/07/2024   INR 0.99 05/24/2015   HGBA1C 5.8 10/07/2024   Assessment/Plan:  [name] is a 59 y.o. Black or African American [2] female with  has a past medical history of ANXIETY (03/27/2010), BACK PAIN, CHRONIC (03/27/2010), Classic migraine with aura (05/30/2015), COMMON MIGRAINE (03/27/2010), DISC DISEASE, CERVICAL (03/27/2010), FATIGUE (03/27/2010), GERD (03/27/2010), Glaucoma, HYPERLIPIDEMIA (03/27/2010), Multiple sclerosis (10/23/2010), Myocardial infarction (HCC),  Obesity, Osteoporosis (09/24/2012), Palpitations, Seasonal allergies,  and Unspecified vitamin D  deficiency (10/23/2010).  Multiple sclerosis For local neurology referral for f/u  Preventative health care Age and sex appropriate education and counseling updated with regular exercise and diet Referrals for preventative services - sees GYN yearly,   Immunizations addressed - declines hep b Smoking counseling  - none needed Evidence for depression or other mood disorder - none significant Most recent labs reviewed. I have personally reviewed and have noted: 1) the patient's medical and social history 2) The patient's current medications and supplements 3) The patient's height, weight, and BMI have been recorded in the chart   Hypercholesterolemia Lab Results  Component Value Date   LDLCALC 104 (H) 10/07/2024   Mild uncontrolled, pt to continue current statin vytorin  10 40 mg and lower chol diet, declines other change   Obesity (BMI 30-39.9) Ok for wegovy  1.5/12/16/23 mg titration  Prediabetes Lab Results  Component Value Date   HGBA1C 5.8 10/07/2024   Stable, pt to continue current medical treatment  - diet, wt control   Vitamin B12 deficiency Lab Results  Component Value Date   VITAMINB12 1,131 (H) 10/07/2024   Stable, cont oral replacement - b12 1000 mcg qd   Vitamin D  deficiency Last vitamin D  Lab Results  Component Value Date   VD25OH 46.68 10/07/2024   Stable, cont oral replacement  Followup: Return in about 1 year (around 10/07/2025).  Lynwood Rush, MD 10/08/2024 12:42 PM [facility]     [1]  Allergies Allergen Reactions   Reglan  [Metoclopramide ] Palpitations    Felt heart beating fast and chest pressure after medication administration   Topamax Other (See Comments)    dizziness   Topiramate Other (See Comments)    dizziness  [2]  Current Outpatient Medications on File Prior to Visit  Medication Sig Dispense Refill   alendronate  (FOSAMAX ) 70 MG  tablet Take 1 tablet (70 mg total) by mouth once a week. Take with a full glass of water on an empty stomach. 13 tablet 3   aspirin  81 MG EC tablet Take 1 tablet (81 mg total) by mouth daily. Swallow whole. 30 tablet 12   brimonidine  (ALPHAGAN ) 0.2 % ophthalmic solution Place 1 drop into both eyes 3 (three) times daily.     Efinaconazole  10 % SOLN Apply 1 drop topically daily. 4 mL 11   ezetimibe -simvastatin  (VYTORIN ) 10-40 MG tablet Take 1 tablet by mouth daily.     meloxicam  (MOBIC ) 15 MG tablet Take 15 mg by mouth 3 (three) times daily.     methocarbamol  (ROBAXIN ) 500 MG tablet Take 1 tablet (500 mg total) by mouth every 8 (eight) hours as needed for muscle spasms. 30 tablet 0   naproxen  (NAPROSYN ) 500 MG tablet Take 1 tablet (500 mg total) by mouth 2 (two) times daily. 30 tablet 0   timolol  (TIMOPTIC ) 0.5 % ophthalmic solution 1 drop 2 (two) times daily.     Vitamin D , Ergocalciferol , (DRISDOL ) 1.25 MG (50000 UNIT) CAPS capsule Take 1 capsule (50,000 Units total) by mouth every 7 (seven) days. 12 capsule 0   No current facility-administered medications on file prior to visit.

## 2024-10-08 ENCOUNTER — Encounter: Payer: Self-pay | Admitting: Internal Medicine

## 2024-10-08 NOTE — Assessment & Plan Note (Signed)
 Last vitamin D  Lab Results  Component Value Date   VD25OH 46.68 10/07/2024   Stable, cont oral replacement

## 2024-10-08 NOTE — Assessment & Plan Note (Signed)
 Age and sex appropriate education and counseling updated with regular exercise and diet Referrals for preventative services - sees GYN yearly,   Immunizations addressed - declines hep b Smoking counseling  - none needed Evidence for depression or other mood disorder - none significant Most recent labs reviewed. I have personally reviewed and have noted: 1) the patient's medical and social history 2) The patient's current medications and supplements 3) The patient's height, weight, and BMI have been recorded in the chart

## 2024-10-08 NOTE — Assessment & Plan Note (Signed)
 For local neurology referral for f/u

## 2024-10-08 NOTE — Assessment & Plan Note (Signed)
 Ok for wegovy  1.5/12/16/23 mg titration

## 2024-10-08 NOTE — Assessment & Plan Note (Signed)
 Lab Results  Component Value Date   VITAMINB12 1,131 (H) 10/07/2024   Stable, cont oral replacement - b12 1000 mcg qd

## 2024-10-08 NOTE — Assessment & Plan Note (Signed)
 Lab Results  Component Value Date   HGBA1C 5.8 10/07/2024   Stable, pt to continue current medical treatment  - diet, wt control

## 2024-10-08 NOTE — Assessment & Plan Note (Signed)
 Lab Results  Component Value Date   LDLCALC 104 (H) 10/07/2024   Mild uncontrolled, pt to continue current statin vytorin  10 40 mg and lower chol diet, declines other change

## 2024-10-19 ENCOUNTER — Ambulatory Visit (INDEPENDENT_AMBULATORY_CARE_PROVIDER_SITE_OTHER): Admitting: Family Medicine

## 2024-11-10 ENCOUNTER — Ambulatory Visit: Payer: PRIVATE HEALTH INSURANCE | Admitting: Internal Medicine

## 2024-11-30 ENCOUNTER — Ambulatory Visit: Admitting: Internal Medicine
